# Patient Record
Sex: Female | Born: 1950 | Race: White | Hispanic: No | Marital: Married | Smoking: Former smoker
Health system: Southern US, Community
[De-identification: ages and names within clinical notes are randomized; demographics above are authoritative.]

## PROBLEM LIST (undated history)

## (undated) DIAGNOSIS — C801 Malignant (primary) neoplasm, unspecified: Secondary | ICD-10-CM

## (undated) DIAGNOSIS — I10 Essential (primary) hypertension: Secondary | ICD-10-CM

---

## 1995-07-11 DIAGNOSIS — C4359 Malignant melanoma of other part of trunk: Secondary | ICD-10-CM

## 1995-07-11 HISTORY — DX: Malignant melanoma of other part of trunk: C43.59

## 2010-08-05 ENCOUNTER — Emergency Department (HOSPITAL_COMMUNITY)
Admission: EM | Admit: 2010-08-05 | Discharge: 2010-08-06 | Payer: Self-pay | Source: Home / Self Care | Admitting: Emergency Medicine

## 2010-08-06 LAB — CBC
HCT: 45.7 % (ref 36.0–46.0)
Hemoglobin: 15.7 g/dL — ABNORMAL HIGH (ref 12.0–15.0)
MCH: 31.3 pg (ref 26.0–34.0)
MCHC: 34.4 g/dL (ref 30.0–36.0)
MCV: 91.2 fL (ref 78.0–100.0)
Platelets: 209 10*3/uL (ref 150–400)
RBC: 5.01 MIL/uL (ref 3.87–5.11)
RDW: 12.7 % (ref 11.5–15.5)
WBC: 10.9 10*3/uL — ABNORMAL HIGH (ref 4.0–10.5)

## 2010-08-06 LAB — COMPREHENSIVE METABOLIC PANEL
ALT: 20 U/L (ref 0–35)
AST: 34 U/L (ref 0–37)
Albumin: 4.5 g/dL (ref 3.5–5.2)
Alkaline Phosphatase: 100 U/L (ref 39–117)
BUN: 8 mg/dL (ref 6–23)
CO2: 26 mEq/L (ref 19–32)
Calcium: 9.6 mg/dL (ref 8.4–10.5)
Chloride: 101 mEq/L (ref 96–112)
Creatinine, Ser: 1.01 mg/dL (ref 0.4–1.2)
GFR calc Af Amer: >60 mL/min (ref >60)
GFR calc non Af Amer: 56 mL/min — ABNORMAL LOW (ref >60)
Glucose, Bld: 160 mg/dL — ABNORMAL HIGH (ref 70–99)
Potassium: 3.7 mEq/L (ref 3.5–5.1)
Sodium: 140 mEq/L (ref 135–145)
Total Bilirubin: 1.2 mg/dL (ref 0.3–1.2)
Total Protein: 7.7 g/dL (ref 6.0–8.3)

## 2010-08-06 LAB — POCT CARDIAC MARKERS
CKMB, poc: 1.3 ng/mL (ref 1.0–8.0)
Myoglobin, poc: 118 ng/mL (ref 12–200)
Troponin i, poc: <0.05 ng/mL (ref 0.00–0.09)

## 2010-08-06 LAB — URINALYSIS, ROUTINE W REFLEX MICROSCOPIC
Bilirubin Urine: NEGATIVE
Ketones, ur: NEGATIVE mg/dL
Nitrite: NEGATIVE
Protein, ur: 100 mg/dL — AB
Specific Gravity, Urine: 1.009 (ref 1.005–1.030)
Urine Glucose, Fasting: NEGATIVE mg/dL
Urobilinogen, UA: 0.2 mg/dL (ref 0.0–1.0)
pH: 7 (ref 5.0–8.0)

## 2010-08-06 LAB — URINE MICROSCOPIC-ADD ON

## 2010-08-06 LAB — DIFFERENTIAL
Basophils Absolute: 0.1 10*3/uL (ref 0.0–0.1)
Basophils Relative: 1 % (ref 0–1)
Eosinophils Absolute: 0.1 10*3/uL (ref 0.0–0.7)
Eosinophils Relative: 1 % (ref 0–5)
Lymphocytes Relative: 25 % (ref 12–46)
Lymphs Abs: 2.7 10*3/uL (ref 0.7–4.0)
Monocytes Absolute: 0.8 10*3/uL (ref 0.1–1.0)
Monocytes Relative: 7 % (ref 3–12)
Neutro Abs: 7.2 10*3/uL (ref 1.7–7.7)
Neutrophils Relative %: 66 % (ref 43–77)

## 2010-08-06 LAB — ETHANOL: Alcohol, Ethyl (B): <5 mg/dL (ref 0–10)

## 2010-08-06 LAB — RAPID URINE DRUG SCREEN, HOSP PERFORMED
Amphetamines: NOT DETECTED
Barbiturates: NOT DETECTED
Benzodiazepines: NOT DETECTED
Cocaine: NOT DETECTED
Opiates: NOT DETECTED
Tetrahydrocannabinol: NOT DETECTED

## 2010-08-07 ENCOUNTER — Inpatient Hospital Stay (HOSPITAL_COMMUNITY)
Admission: EM | Admit: 2010-08-07 | Discharge: 2010-08-12 | DRG: 304 | Disposition: A | Discharge status: Home / Self Care | Payer: Self-pay | Attending: Internal Medicine | Admitting: Internal Medicine

## 2010-08-07 ENCOUNTER — Emergency Department (HOSPITAL_COMMUNITY)
Admission: EM | Admit: 2010-08-07 | Discharge: 2010-08-07 | Payer: Self-pay | Source: Home / Self Care | Admitting: Family Medicine

## 2010-08-07 DIAGNOSIS — Z91199 Patient's noncompliance with other medical treatment and regimen due to unspecified reason: Secondary | ICD-10-CM

## 2010-08-07 DIAGNOSIS — I6350 Cerebral infarction due to unspecified occlusion or stenosis of unspecified cerebral artery: Secondary | ICD-10-CM | POA: Diagnosis present

## 2010-08-07 DIAGNOSIS — F411 Generalized anxiety disorder: Secondary | ICD-10-CM | POA: Diagnosis present

## 2010-08-07 DIAGNOSIS — I10 Essential (primary) hypertension: Principal | ICD-10-CM | POA: Diagnosis present

## 2010-08-07 DIAGNOSIS — R569 Unspecified convulsions: Secondary | ICD-10-CM | POA: Diagnosis present

## 2010-08-07 DIAGNOSIS — Z9119 Patient's noncompliance with other medical treatment and regimen: Secondary | ICD-10-CM

## 2010-08-07 DIAGNOSIS — H269 Unspecified cataract: Secondary | ICD-10-CM | POA: Diagnosis present

## 2010-08-07 DIAGNOSIS — Z7982 Long term (current) use of aspirin: Secondary | ICD-10-CM

## 2010-08-07 LAB — CBC
HCT: 46.4 % — ABNORMAL HIGH (ref 36.0–46.0)
Hemoglobin: 15.5 g/dL — ABNORMAL HIGH (ref 12.0–15.0)
MCH: 30.9 pg (ref 26.0–34.0)
MCHC: 33.4 g/dL (ref 30.0–36.0)
MCV: 92.6 fL (ref 78.0–100.0)
Platelets: 200 10*3/uL (ref 150–400)
RBC: 5.01 MIL/uL (ref 3.87–5.11)
RDW: 12.6 % (ref 11.5–15.5)
WBC: 12.5 10*3/uL — ABNORMAL HIGH (ref 4.0–10.5)

## 2010-08-07 LAB — COMPREHENSIVE METABOLIC PANEL
ALT: 22 U/L (ref 0–35)
AST: 42 U/L — ABNORMAL HIGH (ref 0–37)
Albumin: 4.4 g/dL (ref 3.5–5.2)
Alkaline Phosphatase: 88 U/L (ref 39–117)
BUN: 7 mg/dL (ref 6–23)
CO2: 28 mEq/L (ref 19–32)
Calcium: 9.5 mg/dL (ref 8.4–10.5)
Chloride: 102 mEq/L (ref 96–112)
Creatinine, Ser: 0.83 mg/dL (ref 0.4–1.2)
GFR calc Af Amer: >60 mL/min (ref >60)
GFR calc non Af Amer: >60 mL/min (ref >60)
Glucose, Bld: 116 mg/dL — ABNORMAL HIGH (ref 70–99)
Potassium: 4.3 mEq/L (ref 3.5–5.1)
Sodium: 140 mEq/L (ref 135–145)
Total Bilirubin: 2 mg/dL — ABNORMAL HIGH (ref 0.3–1.2)
Total Protein: 7.7 g/dL (ref 6.0–8.3)

## 2010-08-07 LAB — URINALYSIS, ROUTINE W REFLEX MICROSCOPIC
Bilirubin Urine: NEGATIVE
Hgb urine dipstick: NEGATIVE
Ketones, ur: NEGATIVE mg/dL
Nitrite: NEGATIVE
Protein, ur: NEGATIVE mg/dL
Specific Gravity, Urine: 1.006 (ref 1.005–1.030)
Urine Glucose, Fasting: NEGATIVE mg/dL
Urobilinogen, UA: 0.2 mg/dL (ref 0.0–1.0)
pH: 7.5 (ref 5.0–8.0)

## 2010-08-07 LAB — POCT CARDIAC MARKERS
CKMB, poc: 1.3 ng/mL (ref 1.0–8.0)
CKMB, poc: 2.4 ng/mL (ref 1.0–8.0)
Myoglobin, poc: 132 ng/mL (ref 12–200)
Myoglobin, poc: 148 ng/mL (ref 12–200)
Troponin i, poc: <0.05 ng/mL (ref 0.00–0.09)
Troponin i, poc: <0.05 ng/mL (ref 0.00–0.09)

## 2010-08-07 LAB — TROPONIN I: Troponin I: 0.01 ng/mL (ref 0.00–0.06)

## 2010-08-07 LAB — CK TOTAL AND CKMB (NOT AT ARMC)
CK, MB: 2.8 ng/mL (ref 0.3–4.0)
Relative Index: 0.9 (ref 0.0–2.5)
Total CK: 300 U/L — ABNORMAL HIGH (ref 7–177)

## 2010-08-07 LAB — DIFFERENTIAL
Basophils Absolute: 0.1 10*3/uL (ref 0.0–0.1)
Basophils Relative: 1 % (ref 0–1)
Eosinophils Absolute: 0.2 10*3/uL (ref 0.0–0.7)
Eosinophils Relative: 2 % (ref 0–5)
Lymphocytes Relative: 23 % (ref 12–46)
Lymphs Abs: 2.9 10*3/uL (ref 0.7–4.0)
Monocytes Absolute: 0.9 10*3/uL (ref 0.1–1.0)
Monocytes Relative: 7 % (ref 3–12)
Neutro Abs: 8.4 10*3/uL — ABNORMAL HIGH (ref 1.7–7.7)
Neutrophils Relative %: 67 % (ref 43–77)

## 2010-08-07 LAB — RAPID STREP SCREEN (MED CTR MEBANE ONLY): Streptococcus, Group A Screen (Direct): NEGATIVE

## 2010-08-08 LAB — CARDIAC PANEL(CRET KIN+CKTOT+MB+TROPI)
CK, MB: 4.2 ng/mL — ABNORMAL HIGH (ref 0.3–4.0)
CK, MB: 4.4 ng/mL — ABNORMAL HIGH (ref 0.3–4.0)
Relative Index: 1.2 (ref 0.0–2.5)
Relative Index: 1 (ref 0.0–2.5)
Total CK: 362 U/L — ABNORMAL HIGH (ref 7–177)
Total CK: 421 U/L — ABNORMAL HIGH (ref 7–177)
Troponin I: 0.11 ng/mL — ABNORMAL HIGH (ref 0.00–0.06)
Troponin I: 0.16 ng/mL — ABNORMAL HIGH (ref 0.00–0.06)

## 2010-08-08 LAB — LIPID PANEL
Cholesterol: 149 mg/dL (ref 0–200)
HDL: 34 mg/dL — ABNORMAL LOW (ref >39)
LDL Cholesterol: 92 mg/dL (ref 0–99)
Total CHOL/HDL Ratio: 4.4 RATIO
Triglycerides: 115 mg/dL (ref <150)
VLDL: 23 mg/dL (ref 0–40)

## 2010-08-08 LAB — GLUCOSE, CAPILLARY
Glucose-Capillary: 98 mg/dL (ref 70–99)
Glucose-Capillary: 113 mg/dL — ABNORMAL HIGH (ref 70–99)
Glucose-Capillary: 147 mg/dL — ABNORMAL HIGH (ref 70–99)

## 2010-08-08 LAB — MRSA PCR SCREENING: MRSA by PCR: NEGATIVE

## 2010-08-08 LAB — TSH: TSH: 2.105 u[IU]/mL (ref 0.350–4.500)

## 2010-08-09 LAB — CBC
HCT: 40.9 % (ref 36.0–46.0)
Hemoglobin: 13.5 g/dL (ref 12.0–15.0)
MCH: 30.7 pg (ref 26.0–34.0)
MCHC: 33 g/dL (ref 30.0–36.0)
MCV: 93 fL (ref 78.0–100.0)
Platelets: 161 10*3/uL (ref 150–400)
RBC: 4.4 MIL/uL (ref 3.87–5.11)
RDW: 13.1 % (ref 11.5–15.5)
WBC: 10.1 10*3/uL (ref 4.0–10.5)

## 2010-08-09 LAB — RENAL FUNCTION PANEL
Albumin: 3.6 g/dL (ref 3.5–5.2)
BUN: 12 mg/dL (ref 6–23)
CO2: 28 mEq/L (ref 19–32)
Calcium: 8.8 mg/dL (ref 8.4–10.5)
Chloride: 102 mEq/L (ref 96–112)
Creatinine, Ser: 0.91 mg/dL (ref 0.4–1.2)
GFR calc Af Amer: >60 mL/min (ref >60)
GFR calc non Af Amer: >60 mL/min (ref >60)
Glucose, Bld: 101 mg/dL — ABNORMAL HIGH (ref 70–99)
Phosphorus: 3.3 mg/dL (ref 2.3–4.6)
Potassium: 3.5 mEq/L (ref 3.5–5.1)
Sodium: 139 mEq/L (ref 135–145)

## 2010-08-09 LAB — DIFFERENTIAL
Basophils Absolute: 0.1 10*3/uL (ref 0.0–0.1)
Basophils Relative: 1 % (ref 0–1)
Eosinophils Absolute: 0.3 10*3/uL (ref 0.0–0.7)
Eosinophils Relative: 3 % (ref 0–5)
Lymphocytes Relative: 32 % (ref 12–46)
Lymphs Abs: 3.2 10*3/uL (ref 0.7–4.0)
Monocytes Absolute: 0.9 10*3/uL (ref 0.1–1.0)
Monocytes Relative: 9 % (ref 3–12)
Neutro Abs: 5.6 10*3/uL (ref 1.7–7.7)
Neutrophils Relative %: 56 % (ref 43–77)

## 2010-08-11 LAB — CBC
HCT: 41.5 % (ref 36.0–46.0)
Hemoglobin: 13.7 g/dL (ref 12.0–15.0)
MCH: 30.7 pg (ref 26.0–34.0)
MCHC: 33 g/dL (ref 30.0–36.0)
MCV: 93 fL (ref 78.0–100.0)
Platelets: 199 10*3/uL (ref 150–400)
RBC: 4.46 MIL/uL (ref 3.87–5.11)
RDW: 13.1 % (ref 11.5–15.5)
WBC: 10.7 10*3/uL — ABNORMAL HIGH (ref 4.0–10.5)

## 2010-08-11 LAB — BASIC METABOLIC PANEL
BUN: 10 mg/dL (ref 6–23)
CO2: 26 mEq/L (ref 19–32)
Calcium: 9 mg/dL (ref 8.4–10.5)
Chloride: 99 mEq/L (ref 96–112)
Creatinine, Ser: 0.8 mg/dL (ref 0.4–1.2)
GFR calc Af Amer: >60 mL/min (ref >60)
GFR calc non Af Amer: >60 mL/min (ref >60)
Glucose, Bld: 113 mg/dL — ABNORMAL HIGH (ref 70–99)
Potassium: 3.8 mEq/L (ref 3.5–5.1)
Sodium: 135 mEq/L (ref 135–145)

## 2010-08-12 LAB — CBC
HCT: 38.7 % (ref 36.0–46.0)
Hemoglobin: 12.9 g/dL (ref 12.0–15.0)
MCH: 30.2 pg (ref 26.0–34.0)
MCHC: 33.3 g/dL (ref 30.0–36.0)
MCV: 90.6 fL (ref 78.0–100.0)
Platelets: 200 10*3/uL (ref 150–400)
RBC: 4.27 MIL/uL (ref 3.87–5.11)
RDW: 12.8 % (ref 11.5–15.5)
WBC: 10.2 10*3/uL (ref 4.0–10.5)

## 2010-08-12 LAB — BASIC METABOLIC PANEL
BUN: 9 mg/dL (ref 6–23)
CO2: 27 mEq/L (ref 19–32)
Calcium: 9.3 mg/dL (ref 8.4–10.5)
Chloride: 93 mEq/L — ABNORMAL LOW (ref 96–112)
Creatinine, Ser: 0.94 mg/dL (ref 0.4–1.2)
GFR calc Af Amer: >60 mL/min (ref >60)
GFR calc non Af Amer: >60 mL/min (ref >60)
Glucose, Bld: 105 mg/dL — ABNORMAL HIGH (ref 70–99)
Potassium: 3.8 mEq/L (ref 3.5–5.1)
Sodium: 132 mEq/L — ABNORMAL LOW (ref 135–145)

## 2010-08-12 LAB — MRSA PCR SCREENING: MRSA by PCR: NEGATIVE

## 2010-08-15 ENCOUNTER — Emergency Department (HOSPITAL_COMMUNITY): Payer: Self-pay

## 2010-08-15 ENCOUNTER — Emergency Department (HOSPITAL_COMMUNITY)
Admission: EM | Admit: 2010-08-15 | Discharge: 2010-08-15 | Disposition: A | Discharge status: Home / Self Care | Payer: Self-pay | Attending: Emergency Medicine | Admitting: Emergency Medicine

## 2010-08-15 DIAGNOSIS — K8020 Calculus of gallbladder without cholecystitis without obstruction: Secondary | ICD-10-CM | POA: Insufficient documentation

## 2010-08-15 DIAGNOSIS — I10 Essential (primary) hypertension: Secondary | ICD-10-CM | POA: Insufficient documentation

## 2010-08-15 DIAGNOSIS — R109 Unspecified abdominal pain: Secondary | ICD-10-CM | POA: Insufficient documentation

## 2010-08-15 DIAGNOSIS — J029 Acute pharyngitis, unspecified: Secondary | ICD-10-CM | POA: Insufficient documentation

## 2010-08-15 LAB — COMPREHENSIVE METABOLIC PANEL
ALT: 37 U/L — ABNORMAL HIGH (ref 0–35)
AST: 42 U/L — ABNORMAL HIGH (ref 0–37)
Albumin: 4.1 g/dL (ref 3.5–5.2)
Alkaline Phosphatase: 81 U/L (ref 39–117)
BUN: 11 mg/dL (ref 6–23)
CO2: 27 mEq/L (ref 19–32)
Calcium: 9.5 mg/dL (ref 8.4–10.5)
Chloride: 90 mEq/L — ABNORMAL LOW (ref 96–112)
Creatinine, Ser: 0.9 mg/dL (ref 0.4–1.2)
GFR calc Af Amer: >60 mL/min (ref >60)
GFR calc non Af Amer: >60 mL/min (ref >60)
Glucose, Bld: 110 mg/dL — ABNORMAL HIGH (ref 70–99)
Potassium: 4.2 mEq/L (ref 3.5–5.1)
Sodium: 129 mEq/L — ABNORMAL LOW (ref 135–145)
Total Bilirubin: 1.8 mg/dL — ABNORMAL HIGH (ref 0.3–1.2)
Total Protein: 7.3 g/dL (ref 6.0–8.3)

## 2010-08-15 LAB — DIFFERENTIAL
Basophils Absolute: 0.1 10*3/uL (ref 0.0–0.1)
Basophils Relative: 1 % (ref 0–1)
Eosinophils Absolute: 0.2 10*3/uL (ref 0.0–0.7)
Eosinophils Relative: 1 % (ref 0–5)
Lymphocytes Relative: 18 % (ref 12–46)
Lymphs Abs: 2.6 10*3/uL (ref 0.7–4.0)
Monocytes Absolute: 1.1 10*3/uL — ABNORMAL HIGH (ref 0.1–1.0)
Monocytes Relative: 8 % (ref 3–12)
Neutro Abs: 10.4 10*3/uL — ABNORMAL HIGH (ref 1.7–7.7)
Neutrophils Relative %: 72 % (ref 43–77)

## 2010-08-15 LAB — CBC
HCT: 41.3 % (ref 36.0–46.0)
Hemoglobin: 14.5 g/dL (ref 12.0–15.0)
MCH: 31.7 pg (ref 26.0–34.0)
MCHC: 35.1 g/dL (ref 30.0–36.0)
MCV: 90.4 fL (ref 78.0–100.0)
Platelets: 261 10*3/uL (ref 150–400)
RBC: 4.57 MIL/uL (ref 3.87–5.11)
RDW: 12.5 % (ref 11.5–15.5)
WBC: 14.4 10*3/uL — ABNORMAL HIGH (ref 4.0–10.5)

## 2010-08-15 LAB — URINALYSIS, ROUTINE W REFLEX MICROSCOPIC
Bilirubin Urine: NEGATIVE
Hgb urine dipstick: NEGATIVE
Ketones, ur: NEGATIVE mg/dL
Nitrite: NEGATIVE
Protein, ur: NEGATIVE mg/dL
Specific Gravity, Urine: 1.01 (ref 1.005–1.030)
Urine Glucose, Fasting: NEGATIVE mg/dL
Urobilinogen, UA: 0.2 mg/dL (ref 0.0–1.0)
pH: 7 (ref 5.0–8.0)

## 2010-08-15 LAB — URINE MICROSCOPIC-ADD ON

## 2010-08-15 LAB — LIPASE, BLOOD: Lipase: 104 U/L — ABNORMAL HIGH (ref 11–59)

## 2010-08-15 LAB — RAPID STREP SCREEN (MED CTR MEBANE ONLY): Streptococcus, Group A Screen (Direct): NEGATIVE

## 2010-08-15 MED ORDER — IOHEXOL 300 MG/ML  SOLN: 100.0000 mL | Freq: Once | INTRAMUSCULAR | Status: DC | PRN

## 2010-08-15 NOTE — Consult Note (Signed)
[name], IMPARATO NO.:  [id number]  MEDICAL RECORD NO.:  [id number]          PATIENT TYPE:  EMS  LOCATION:  MAJO                         FACILITY:  [facility]  PHYSICIAN:  Homero Fellers, MD   DATE OF BIRTH:  [birth year]  DATE OF CONSULTATION:  08/07/2010 DATE OF DISCHARGE:                                CONSULTATION   PRIMARY CARE PHYSICIAN:  None.  CHIEF COMPLAINT:  The patient presented because of dry mouth.  HISTORY:  This is a 60 year old woman with longstanding history of high blood pressure who has not been on medications since 2007.  She came to the emergency room 2 days ago and was found to have elevated blood pressure.  She was given atenolol and was discharged home.  The patient said she has been compliant with the medication but today came back with elevated blood pressure as high as 209/128 in the emergency room.  She was given 20 mg of IV labetalol with improvement of her blood pressure to 149/121.  The patient denies any chest pain, shortness of breath, nausea, vomiting, or diaphoresis.  She has no headache or weakness in any of the extremities.  The patient also has significant history of anxiety and sometimes complained of poor sleep.  There was a mention about bumping into staff or object by a family member.  He wondered there was no dizziness, vertigo, or falls.  The patient has bad vision from her cataracts and has not seen a doctor in several years.  She says she has little or no vision in her left eye.  PAST MEDICAL HISTORY: 1. High blood pressure. 2. Cataracts. 3. Anxiety. 4. Poor vision. 5. Panic attack.  MEDICATIONS:  ATENOLOL.  ALLERGIES:  None.  SOCIAL HISTORY:  No smoking, alcohol, or drug use.  FAMILY HISTORY:  Noncontributory.  REVIEW OF SYSTEMS:  Ten-point review of systems is essentially as above.  PHYSICAL EXAMINATION:  VITAL SIGNS:  Blood pressure currently 149/121, pulse 84, respirations 18, and temperature  97.4. HEENT:  Pallor. NECK:  Supple.  No JVD. LUNGS:  Clear bilaterally to auscultation. HEART:  S1-S2.  No murmurs. ABDOMEN:  Full, soft, and nontender. EXTREMITIES:  No edema. NEUROLOGIC:  There is no deficit apart from poor vision in the left eye. The patient is not dizzy upon standing.  She is able to ambulate with no difficulty.  LABORATORY DATA:  Cardiac enzymes x1 negative.  White count 12.5, hemoglobin 15.5, and platelet count 200.  Sodium 140, potassium 4.3, BUN 7, and creatinine 0.8.  Liver enzymes normal.  Chest x-ray showed no active lung disease.  Urinalysis showed no evidence of urinary tract infection.  Head CT showed small lacunar type infarct in the right basal ganglia which I described as old.  ASSESSMENT: 1. This is a 60 year old woman with hypertensive urgency which is     beginning to improve with medication.  The patient has no evidence     to suggest any acute neurological issues going on at this time.     She needs to be further optimized with her blood pressure medicine. 2. Poor vision likely secondary  to cataract which mostly explained     bumping into objects.  She needs to see an ophthalmologist in the     next few weeks. 3. Anxiety.  I will get some anti-anxiety medication such as Valium or     Ativan and get also some Ambien for sleep.  DISPOSITION:  Home.  The patient has been given contact to follow with the new primary care physician.  We will make an effort to get a new provider next week.  CONDITION ON DISCHARGE:  Stable.     Homero Fellers, MD     FA/MEDQ  D:  08/07/2010  T:  08/07/2010  Job:  [id number]  Electronically Signed by Homero Fellers  on 08/15/2010 10:30:55 PM

## 2010-08-15 NOTE — H&P (Signed)
  NAME:  [name], BOLTE NO.:  [id number]  MEDICAL RECORD NO.:  [id number]          PATIENT TYPE:  EMS  LOCATION:  MAJO                         FACILITY:  [facility]  PHYSICIAN:  Homero Fellers, MD   DATE OF BIRTH:  1951-06-12  DATE OF ADMISSION:  08/07/2010 DATE OF DISCHARGE:                             HISTORY & PHYSICAL   ADDENDUM:  This is a 60 year old woman who was initially seen in the emergency room as a consultation and was planned to be discharged home for hypertensive urgency.  She got some medication in the emergency room with improvement in her blood pressure.  Her last blood pressure is 125/74 after getting several medicines including IV labetalol 20 mg, IV hydralazine 20 mg, lisinopril/hydrochlorothiazide 20/12.5, and labetalol 20 mg p.o.  The patient also got 0.5 mg IV Ativan for agitation and anxiety.  Even though her blood pressure is controlled now, she is quite drowsy and probably unsafe to discharge home.  She lives with her husband, but her husband is currently not available to take her home. The patient will be admitted for observation.  She will get vital signs every 4 hours.  I will continue oral labetalol 20 mg p.o. b.i.d. and lisinopril/hydrochlorothiazide 20/12.5 mg one tablet daily.  We can use IV hydralazine for very high blood pressure.  The patient also got a fasting lipid profile and serial cardiac enzymes while admission.  Her admission diagnosis remained the same including: 1. Hypertensive urgency. 2. Anxiety. 3. Poor medication compliance.  PLAN:  As described above.  CONDITION:  Stable.  She can be discharged home in the morning if remained stable.     Homero Fellers, MD     FA/MEDQ  D:  08/07/2010  T:  08/07/2010  Job:  [id number]  Electronically Signed by Homero Fellers  on 08/15/2010 10:31:00 PM

## 2010-08-16 LAB — URINE CULTURE
Colony Count: >=100000
Culture  Setup Time: 201202061200

## 2010-08-18 ENCOUNTER — Emergency Department (HOSPITAL_COMMUNITY)
Admission: EM | Admit: 2010-08-18 | Discharge: 2010-08-18 | Disposition: A | Discharge status: Home / Self Care | Payer: Self-pay | Attending: Emergency Medicine | Admitting: Emergency Medicine

## 2010-08-18 DIAGNOSIS — I10 Essential (primary) hypertension: Secondary | ICD-10-CM | POA: Insufficient documentation

## 2010-08-18 DIAGNOSIS — Z7982 Long term (current) use of aspirin: Secondary | ICD-10-CM | POA: Insufficient documentation

## 2010-08-18 DIAGNOSIS — Z79899 Other long term (current) drug therapy: Secondary | ICD-10-CM | POA: Insufficient documentation

## 2010-08-18 DIAGNOSIS — Z9889 Other specified postprocedural states: Secondary | ICD-10-CM | POA: Insufficient documentation

## 2010-08-18 DIAGNOSIS — J3489 Other specified disorders of nose and nasal sinuses: Secondary | ICD-10-CM | POA: Insufficient documentation

## 2010-08-19 NOTE — Consult Note (Signed)
[name], [name] NO.:  [id number]  MEDICAL RECORD NO.:  [id number]          PATIENT TYPE:  OBV  LOCATION:  3306                         FACILITY:  [facility]  PHYSICIAN:  Levert Feinstein, MD          DATE OF BIRTH:  24-Nov-1950  DATE OF CONSULTATION:  08/08/2010 DATE OF DISCHARGE:                                CONSULTATION   REASON FOR CONSULTATION:  New-onset seizure.  HISTORY OF PRESENT ILLNESS:  This is a pleasant 60 year old female with past medical history of hypertension.  The patient was initially seen in the emergency department on August 06, 2010, at that time she was seen for what is noted to be a panic attack.  The patient was seen by physician assistant Jaynie Crumble, given IV fluid bolus and labetalol.  This patient was discharged home with primary diagnosis of hypertension and advised to see a primary care physician.  The patient was prescribed atenolol 50 mg tabs 30 and was taking 2 tablets daily at that time.  The patient returns to the emergency department on August 07, 2010, at this time for elevated blood pressure and mental status changes.  Per family members, she had been taking her blood pressure medications.  However, her daughter noted that the patient did not seem to be acting herself.  On that visit the patient again was given antihypertensive medications and discharged home.  The patient returned a third time to the [facility] Emergency Department on August 08, 2010. Again, the patient complained of high blood pressure, however at this time there were additional symptoms such as stumbling around and running into walls, falling multiple times.  The patient's sister who is with the patient at that time explained that she was again unable to converse very fluently.  On this visit, the patient's blood pressure was read to be 209/128 and the patient was administered multiple doses of labetalol 20 mg IV push and lisinopril 20 mg p.o.  orally.  In addition, the patient was also given Ativan and hydralazine.  The patient was admitted to [facility] for further evaluation.  After being admitted, the patient was noted to have a seizure event which she described as a full body tonic/posturing events that lasted for about 5 minutes.  The patient at that time was given 2 mg IV of Ativan.  Neurology was asked to consult.  At the present time the patient is alert and oriented, moving all extremities, showing no seizure-like activity.  The patient states that she is not sore.  She denies any recollection of the seizure activity.  Per nurse who is at her bedside, apparently she was postictal status post activity, in addition she did have urinary incontinence.  PAST MEDICAL HISTORY:  Hypertension, cataracts, anxiety, decreased vision secondary to cataracts and panic attacks.  HOME MEDICATIONS:  Atenolol, senna, aspirin, Tylenol, Advil.  ALLERGIES:  No known drug allergies.  SOCIAL HISTORY:  She lives with her daughter and her husband.  She does not smoke, drink or do illicit drugs.  She is retired Musician.  REVIEW OF SYSTEMS:  Positive  for throat discomfort, hypertension, anxiety.  On physical exam; blood pressure is 157/75, pulse 109, respirations 20, temperature 99.2.  The patient is alert and oriented x3.  Carries out 2 and 3 steps commands without difficulty.  Pupils are equal, round and reactive to light and accommodating, slightly sluggish.  She has conjugate gaze.  Extraocular movements are intact.  Visual fields are grossly intact.  Double simultaneous stimuli.  The patient does complain of having decreased vision out of her right eye, however, it is secondary to cataracts.  Face symmetrical.  Tongue is midline.  Uvula is midline.  The patient shows no dysarthria, aphasia or slurred speech. Facial sensation is within normal limits bilaterally.  Shoulder shrug, head turn is within normal  limits.  Coordination, finger-to-nose, heel-to-shin were smooth.  Motor; the patient's motor was 5/5 throughout with no clonus, asterixis or tremors.  Deep tendon reflexes 2+ throughout downgoing toes bilaterally.  She did have 1+ Achilles reflexes.  Drift.  The patient had negative drift in the upper and lower extremity.  Sensation is intact to pinprick, light touch and vibration throughout.  Pulmonary, clear to auscultation bilaterally.  Cardiovascular, S1 and S2 is regular, rate and rhythm.  Neck, negative for bruits and supple.  Labs; CKs 362, CK-MB 4.2, troponin 0.11.  TSH is 2.15.  UA negative. Sodium 140, potassium 4.3, chloride 102, CO2 28, BUN 7, creatinine 0.83, glucose 116, white blood cell count is 12.5, hemoglobin is 15.5, hematocrit 46.4, platelets 200, triglycerides 115, cholesterol 149, HDL 34, LDL is 92.  Imaging; CT of head shows periventricular white matter disease with lacunar infarcts in the right basal ganglia.  CT of head and neck was negative for any fracture or significant encroachment of spinal cord.  ASSESSMENT:  This is a 60 year old female who was admitted initially for hypertensive urgency which was then followed by a new-onset seizure activity that lasted approximately 5 minutes.  The patient does have remote right basal ganglia infarcts which could serve as a seizure focus.  At this time due to this being her first seizure and no further seizures, we will hold off on antiepileptic drugs.  EEG at this time is in progress.  We will continue to follow the patient while she is in the hospital.  If EEG does show diagnostic abnormalities at that time, we will initiate antiepileptic medications.     Felicie Morn, PA-C   ______________________________ Levert Feinstein, MD    DS/MEDQ  D:  08/08/2010  T:  08/09/2010  Job:  [id number]  cc:   Levert Feinstein, MD  Electronically Signed by Felicie Morn PA-C on 08/11/2010 09:12:21 AM Electronically Signed by Levert Feinstein MD on 08/19/2010 10:34:14 AM

## 2010-08-24 NOTE — Discharge Summary (Signed)
NAMEJADAMARIE, [name] NO.:  [id number]  MEDICAL RECORD NO.:  [id number]           PATIENT TYPE:  I  LOCATION:  2916                         FACILITY:  [facility]  PHYSICIAN:  Peggye Pitt, M.D. DATE OF BIRTH:  1950/12/23  DATE OF ADMISSION:  08/07/2010 DATE OF DISCHARGE:  08/12/2010                              DISCHARGE SUMMARY   PRIMARY CARE PHYSICIAN:  She has been newly set up to see Dr. Andrey Campanile at [facility] on September 30, 2010 at 2:15 p.m.  She, however, has her [facility] eligibility appointment on September 05, 2010 at 3 p.m.  DISCHARGE DIAGNOSES: 1. Hypertensive urgency, resolved. 2. History of hypertension, noncompliant with medications. 3. Anxiety. 4. Cataracts. 5. New cerebrovascular accident. 6. 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  DISCHARGE MEDICATIONS: 1. Aspirin 81 mg daily. 2. Hydrochlorothiazide 25 mg daily. 3. Lisinopril 20 mg daily. 4. Lorazepam 0.25 mg three times a day. 5. Metoprolol 50 mg twice daily. 6. Ambien 5 mg at bedtime as needed for insomnia. 7. Tylenol 500 mg one tablet every 6 hours as needed for pain or     fever. 8. Artificial tears one to two drops in each eye daily. 9. Senna one tablet daily as needed for constipation.  DISPOSITION AND FOLLOWUP:  [name] will be discharged home today in stable and improved condition.  We have secured a followup appointment at [facility], please see above for details.  She has also been set up for outpatient physical therapy and speech therapy.  IMAGES AND PROCEDURES THIS HOSPITALIZATION: 1. Chest x-ray on August 06, 2010, that showed a questionable of     acute bronchitis. 2. CT of head on August 07, 2010, that showed three small occluded-     type infarcts in the right basal ganglia likely remote with     periventricular white matter disease. 3. Repeat chest x-ray on August 07, 2010, that showed stable     borderline  cardiomegaly. 4. CT scan of the head on August 08, 2010, that showed no interval     change with age-indeterminate lacunar infarcts, no hemorrhage or     new infarcts identified. 5. MRI of the brain on August 09, 2010, that showed a subacute     infarction evolving the right corona radiata and lentiform nucleus.     She has age advanced atrophy and moderate white matter disease.  An     MRI that shows mild small vessel disease and no significant     aneurysm, proximal stenosis, or branch vessel occlusion.  CONSULTATION THIS HOSPITALIZATION:  Dr. Terrace Arabia with Neurology.  HISTORY AND PHYSICAL:  For full details, please see dictation by Dr. Phineas Douglas on August 07, 2010.  In brief, [name] is a 60 year old Caucasian lady with a history of anxiety and hypertension who has not been taking medications since 2007 secondary to financial difficulties. She came in to the emergency department 2 days prior, was given blood pressure medication and sent home and returned today because of continued headache and dry mouth.  Here, she was found again to have high blood  pressure; however, her blood pressure quickly normalized with some medication given by the EDP and we initially consulted the patient and we were planning on sending her home.  However, the patient was then found to be somewhat drowsy and we are asked to fully admit the patient.  HOSPITAL COURSE BY PROBLEM: 1. Hypertensive urgency.  Her blood pressure did decrease with IV     medication.  She was started on three oral medications including     metoprolol, lisinopril, and hydrochlorothiazide.  The patient has a     pretty significant anxiety disorder and I believe that her anxiety     is also driving her blood pressure up.  On hospital day #4, her     blood pressure increased significantly to the point where we had to     move her back to the step-down unit and start her on an IV drip.     This has now been discontinued for about 14 hours  and her blood     pressure is wonderfu,  now that we have started her on some     scheduled Ativan.  I will proceed with discharging her home today. 2. Seizure disorder.  She did have a seizure episode while in the     hospital.  This prompted a neurological evaluation, ordered an MRI.     Because this was her first seizure and probably related to     hypertension, they decided not to treat with antiseizure     medications at this time.  She has not had any further seizure     episodes. 3. New onset cerebrovascular accident.  This was noted on MRI scan.     This was probably related to high blood pressure.  She has been     started on aspirin.  PT, OT and speech have evaluated her.  We have     set her up for outpatient physical and speech therapy, mostly for     language, as she does have some slurred speech.  Stroke workup     including echoes and carotid Dopplers have been normal.  VITAL SIGNS ON DAY OF DISCHARGE:  Blood pressure 147/84, heart rate 84, respirations 20, sats of 100% on room air, and temperature of 97.6.     Peggye Pitt, M.D.     EH/MEDQ  D:  08/12/2010  T:  08/13/2010  Job:  [id number]  cc:   Levert Feinstein, MD  Electronically Signed by Peggye Pitt M.D. on 08/24/2010 08:11:45 AM

## 2010-09-04 ENCOUNTER — Emergency Department (HOSPITAL_COMMUNITY): Payer: Self-pay

## 2010-09-04 ENCOUNTER — Emergency Department (HOSPITAL_COMMUNITY)
Admission: EM | Admit: 2010-09-04 | Discharge: 2010-09-04 | Disposition: A | Discharge status: Home / Self Care | Payer: Self-pay | Attending: Emergency Medicine | Admitting: Emergency Medicine

## 2010-09-04 DIAGNOSIS — E871 Hypo-osmolality and hyponatremia: Secondary | ICD-10-CM | POA: Insufficient documentation

## 2010-09-04 DIAGNOSIS — R143 Flatulence: Secondary | ICD-10-CM | POA: Insufficient documentation

## 2010-09-04 DIAGNOSIS — R142 Eructation: Secondary | ICD-10-CM | POA: Insufficient documentation

## 2010-09-04 DIAGNOSIS — R141 Gas pain: Secondary | ICD-10-CM | POA: Insufficient documentation

## 2010-09-04 DIAGNOSIS — Z8679 Personal history of other diseases of the circulatory system: Secondary | ICD-10-CM | POA: Insufficient documentation

## 2010-09-04 DIAGNOSIS — R109 Unspecified abdominal pain: Secondary | ICD-10-CM | POA: Insufficient documentation

## 2010-09-04 DIAGNOSIS — M549 Dorsalgia, unspecified: Secondary | ICD-10-CM | POA: Insufficient documentation

## 2010-09-04 DIAGNOSIS — I10 Essential (primary) hypertension: Secondary | ICD-10-CM | POA: Insufficient documentation

## 2010-09-04 DIAGNOSIS — R079 Chest pain, unspecified: Secondary | ICD-10-CM | POA: Insufficient documentation

## 2010-09-04 LAB — COMPREHENSIVE METABOLIC PANEL
ALT: 21 U/L (ref 0–35)
AST: 26 U/L (ref 0–37)
Albumin: 3.9 g/dL (ref 3.5–5.2)
Alkaline Phosphatase: 79 U/L (ref 39–117)
BUN: 9 mg/dL (ref 6–23)
CO2: 26 mEq/L (ref 19–32)
Calcium: 8.9 mg/dL (ref 8.4–10.5)
Chloride: 92 mEq/L — ABNORMAL LOW (ref 96–112)
Creatinine, Ser: 0.92 mg/dL (ref 0.4–1.2)
GFR calc Af Amer: >60 mL/min (ref >60)
GFR calc non Af Amer: >60 mL/min (ref >60)
Glucose, Bld: 157 mg/dL — ABNORMAL HIGH (ref 70–99)
Potassium: 3.8 mEq/L (ref 3.5–5.1)
Sodium: 127 mEq/L — ABNORMAL LOW (ref 135–145)
Total Bilirubin: 1.3 mg/dL — ABNORMAL HIGH (ref 0.3–1.2)
Total Protein: 6.7 g/dL (ref 6.0–8.3)

## 2010-09-04 LAB — DIFFERENTIAL
Basophils Absolute: 0 10*3/uL (ref 0.0–0.1)
Basophils Relative: 1 % (ref 0–1)
Eosinophils Absolute: 0.2 10*3/uL (ref 0.0–0.7)
Eosinophils Relative: 2 % (ref 0–5)
Lymphocytes Relative: 24 % (ref 12–46)
Lymphs Abs: 1.8 10*3/uL (ref 0.7–4.0)
Monocytes Absolute: 0.6 10*3/uL (ref 0.1–1.0)
Monocytes Relative: 8 % (ref 3–12)
Neutro Abs: 4.7 10*3/uL (ref 1.7–7.7)
Neutrophils Relative %: 65 % (ref 43–77)

## 2010-09-04 LAB — CBC
HCT: 38.1 % (ref 36.0–46.0)
Hemoglobin: 13.6 g/dL (ref 12.0–15.0)
MCH: 31.9 pg (ref 26.0–34.0)
MCHC: 35.7 g/dL (ref 30.0–36.0)
MCV: 89.2 fL (ref 78.0–100.0)
Platelets: 158 10*3/uL (ref 150–400)
RBC: 4.27 MIL/uL (ref 3.87–5.11)
RDW: 12.5 % (ref 11.5–15.5)
WBC: 7.2 10*3/uL (ref 4.0–10.5)

## 2010-09-04 LAB — POCT CARDIAC MARKERS
CKMB, poc: 1.6 ng/mL (ref 1.0–8.0)
CKMB, poc: 1.1 ng/mL (ref 1.0–8.0)
Myoglobin, poc: 66.1 ng/mL (ref 12–200)
Myoglobin, poc: 60.5 ng/mL (ref 12–200)
Troponin i, poc: <0.05 ng/mL (ref 0.00–0.09)
Troponin i, poc: <0.05 ng/mL (ref 0.00–0.09)

## 2010-09-04 LAB — URINALYSIS, ROUTINE W REFLEX MICROSCOPIC
Bilirubin Urine: NEGATIVE
Hgb urine dipstick: NEGATIVE
Ketones, ur: NEGATIVE mg/dL
Nitrite: NEGATIVE
Protein, ur: NEGATIVE mg/dL
Specific Gravity, Urine: 1.01 (ref 1.005–1.030)
Urine Glucose, Fasting: NEGATIVE mg/dL
Urobilinogen, UA: 0.2 mg/dL (ref 0.0–1.0)
pH: 6.5 (ref 5.0–8.0)

## 2010-09-04 LAB — LIPASE, BLOOD: Lipase: 68 U/L — ABNORMAL HIGH (ref 11–59)

## 2011-07-18 ENCOUNTER — Emergency Department (HOSPITAL_COMMUNITY)
Admission: EM | Admit: 2011-07-18 | Discharge: 2011-07-18 | Disposition: A | Discharge status: Home / Self Care | Payer: Self-pay | Attending: Emergency Medicine | Admitting: Emergency Medicine

## 2011-07-18 ENCOUNTER — Encounter: Payer: Self-pay | Admitting: *Deleted

## 2011-07-18 DIAGNOSIS — I10 Essential (primary) hypertension: Secondary | ICD-10-CM | POA: Insufficient documentation

## 2011-07-18 DIAGNOSIS — Z859 Personal history of malignant neoplasm, unspecified: Secondary | ICD-10-CM | POA: Insufficient documentation

## 2011-07-18 DIAGNOSIS — Z79899 Other long term (current) drug therapy: Secondary | ICD-10-CM | POA: Insufficient documentation

## 2011-07-18 DIAGNOSIS — Z7982 Long term (current) use of aspirin: Secondary | ICD-10-CM | POA: Insufficient documentation

## 2011-07-18 HISTORY — DX: Malignant (primary) neoplasm, unspecified: C80.1

## 2011-07-18 HISTORY — DX: Essential (primary) hypertension: I10

## 2011-07-18 NOTE — ED Notes (Signed)
Pt to ED for eval of increased blood pressure. Pt states she was at her PCPs office and blood pressure was noted to be 224. Pt states she takes her blood pressure medication daily. Pt denies any complaints. Denies any chest pain or neurological symptoms.

## 2011-07-18 NOTE — ED Notes (Addendum)
Pt sent by pcp for eval of hypertension. Pt went to doctor this am for check-up. Pt denies complaints including headache. Pt is on bp medicine.

## 2011-07-18 NOTE — ED Provider Notes (Signed)
Medical screening examination/treatment/procedure(s) were performed by non-physician practitioner and as supervising physician I was immediately available for consultation/collaboration.   Dione Booze, MD 07/18/11 2044

## 2011-07-18 NOTE — ED Notes (Signed)
Patient states she has a doctors visit for next week for re-evaluation of her blood pressure.

## 2011-07-18 NOTE — ED Provider Notes (Signed)
History     CSN: [id number]  Arrival date & time 07/18/11  1243   First MD Initiated Contact with Patient 07/18/11 1326      Chief Complaint  Patient presents with  . Illegal value: [    sent by doctor    (Consider location/radiation/quality/duration/timing/severity/associated sxs/prior treatment) HPI Comments: Patient states she was sent to the ER [facility] for [facility].  States "I told her that my blood pressure is always this high when I first get to the doctor's office and see a new doctor, but if you let me sit down and talk nice to me, it goes down."  States she thinks her blood pressure was as high as 240 systolic at [facility], but has already decreased to 188/110 here.  States she has been taking her blood pressure medication as directed, but has not been checking her for blood pressure at home as regularly as she used to.  States she checks her blood pressure about once a week and they have been much lower than when she is  around medical personnel. Denies fever, headache, chest pain, shortness of breath, abdominal pain, nausea, vomiting, focal, neurological deficits, any confusion.  The history is provided by the patient.    Past Medical History  Diagnosis Date  . Hypertension   . Cancer     No past surgical history on file.  No family history on file.  History  Substance Use Topics  . Smoking status: Not on file  . Smokeless tobacco: Not on file  . Alcohol Use:     OB History    Grav Para Term Preterm Abortions TAB SAB Ect Mult Living                  Review of Systems  All other systems reviewed and are negative.    Allergies  Review of patient's allergies indicates no known allergies.  Home Medications   Current Outpatient Rx  Name Route Sig Dispense Refill  . ALPRAZOLAM 0.25 MG PO TABS Oral Take 0.25 mg by mouth 3 (three) times daily as needed. For anxiety     . ASPIRIN EC 81 MG PO TBEC Oral Take 81 mg by mouth  daily.      Marland Kitchen CITALOPRAM HYDROBROMIDE 20 MG PO TABS Oral Take 20 mg by mouth daily.      Marland Kitchen DEXTROMETHORPHAN POLISTIREX ER 30 MG/5ML PO LQCR Oral Take 60 mg by mouth as needed. For cough     . DICYCLOMINE HCL 20 MG PO TABS Oral Take 20 mg by mouth 2 (two) times daily as needed.      Marland Kitchen HYPROMELLOSE 2.5 % OP SOLN Both Eyes Place 1 drop into both eyes 3 (three) times daily as needed. For dry eyes     . LISINOPRIL 20 MG PO TABS Oral Take 20 mg by mouth daily.      Marland Kitchen LORATADINE 10 MG PO TABS Oral Take 10 mg by mouth daily.      Marland Kitchen METOPROLOL TARTRATE 50 MG PO TABS Oral Take 50 mg by mouth 2 (two) times daily.        BP 188/110  Pulse 86  Temp 98.2 F (36.8 C)  Resp 22  SpO2 100%  Physical Exam  Nursing note and vitals reviewed. Constitutional: She is oriented to person, place, and time. She appears well-developed and well-nourished.  HENT:  Head: Normocephalic and atraumatic.  Neck: Neck supple.  Cardiovascular: Normal rate, regular  rhythm and normal heart sounds.   Pulmonary/Chest: Breath sounds normal. No respiratory distress. She has no wheezes. She has no rales. She exhibits no tenderness.  Abdominal: Soft. Bowel sounds are normal. There is no tenderness.  Musculoskeletal: Normal range of motion. She exhibits no edema and no tenderness.  Neurological: She is alert and oriented to person, place, and time. She has normal strength. No cranial nerve deficit or sensory deficit. She exhibits normal muscle tone. Coordination normal. GCS eye subscore is 4. GCS verbal subscore is 5. GCS motor subscore is 6.       CN II-XII intact, EOMs intact, grip strengths normal, equal bilaterally, strength 5/5 in all extremities,     ED Course  Procedures (including critical care time)  Labs Reviewed - No data to display No results found.  Discussed patient with Dr Preston Fleeting.  Patient with known hypertension on multiple medications at home.  Patient also with anxiety and known white-coat induced  hypertension.  Patient is completely asymptomatic - denies CP, SOB, HA.  No AMS.  No neuro deficits.  Plan, as discussed with Dr Preston Fleeting, is discharge home without treatment of HTN and continued home treatment as well as home monitoring.  Discussed this with patient, pt agrees with plan.   1. Hypertension       MDM  Patient with known hypertension - completely asymptomatic in ED, sent by her PCP who was a new provider to her today at [facility].  Patient d/c home with instructions for monitoring BP and discussion about reasons for immediate return.  Pt verbalizes understanding.           Rise Patience, [place] 07/18/11 1525

## 2012-02-01 IMAGING — CR DG CHEST 2V
2 series · 2 of 2 positions shown · non-contrast
Comparison: 08/06/2010

CLINICAL DATA: Uncontrolled hypertension.

CHEST - 2 VIEW

[w chest pa]
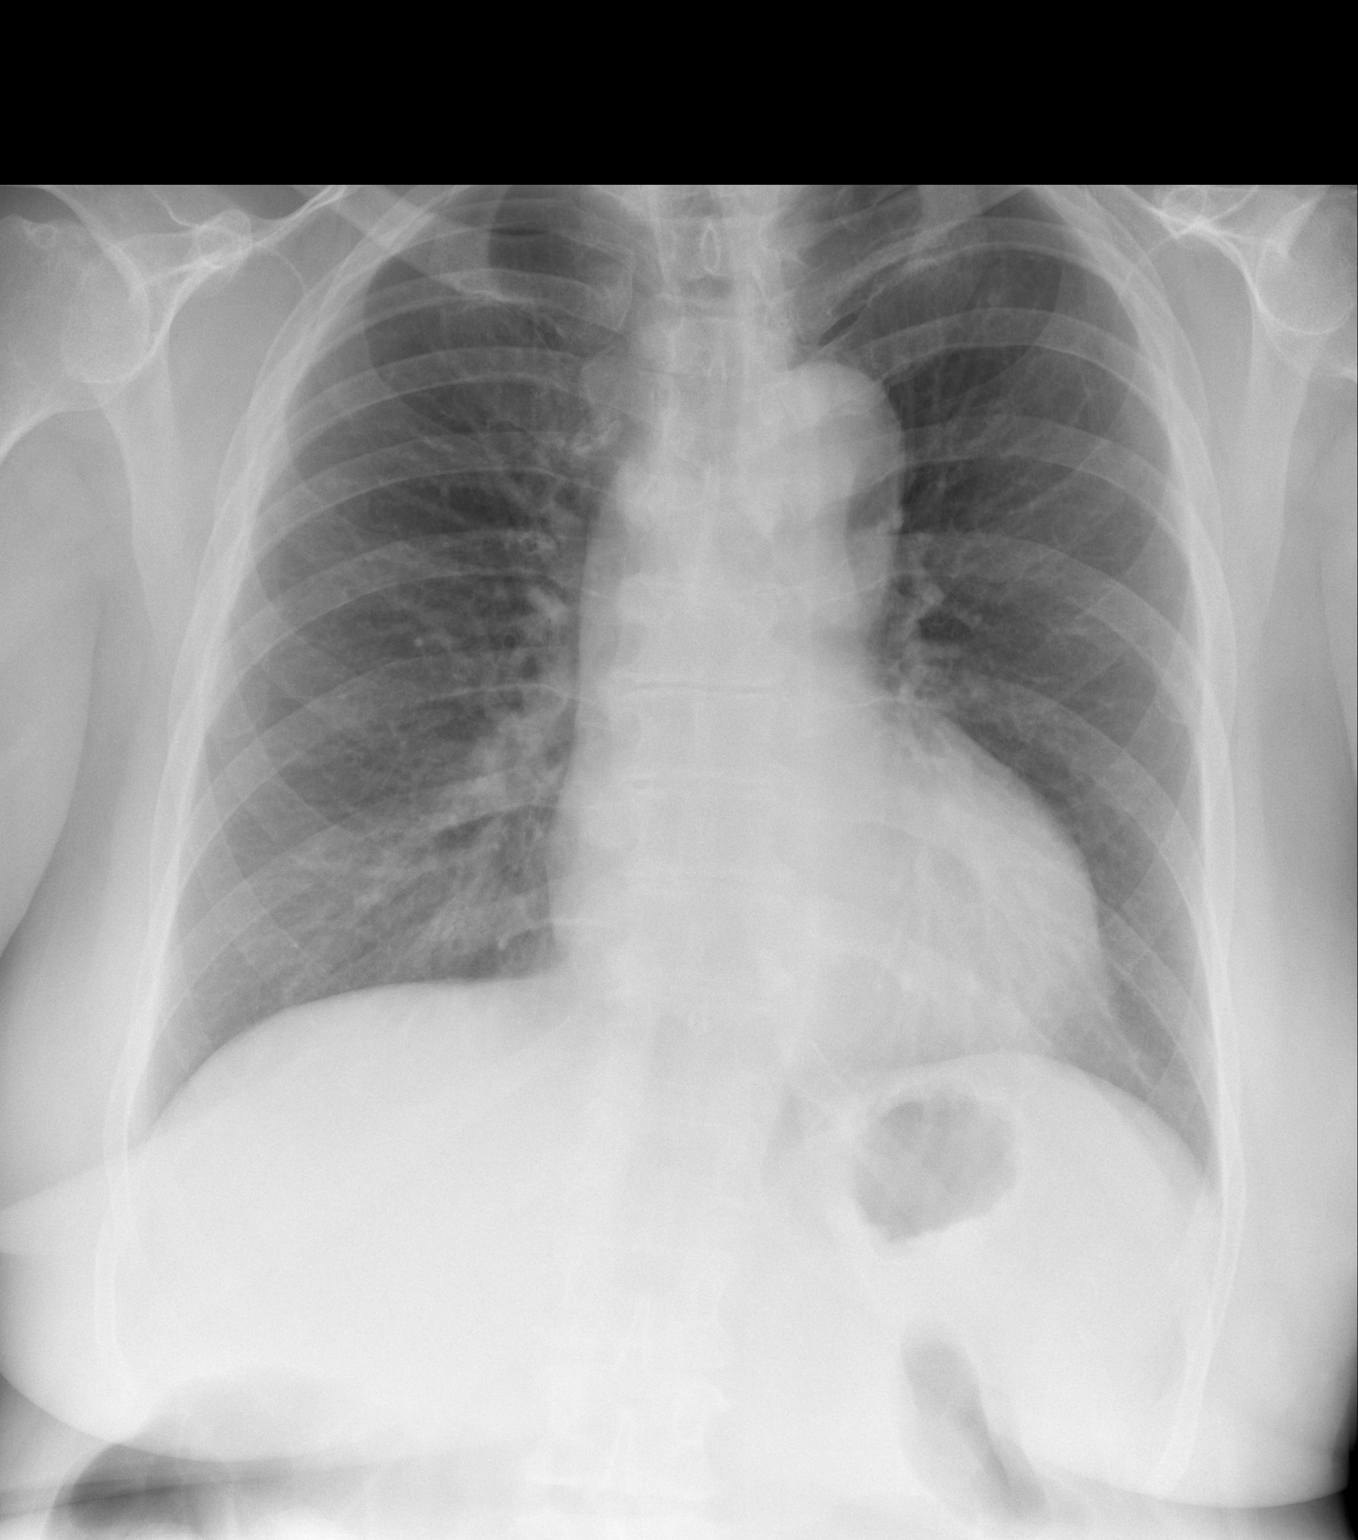

[w chest lat *]
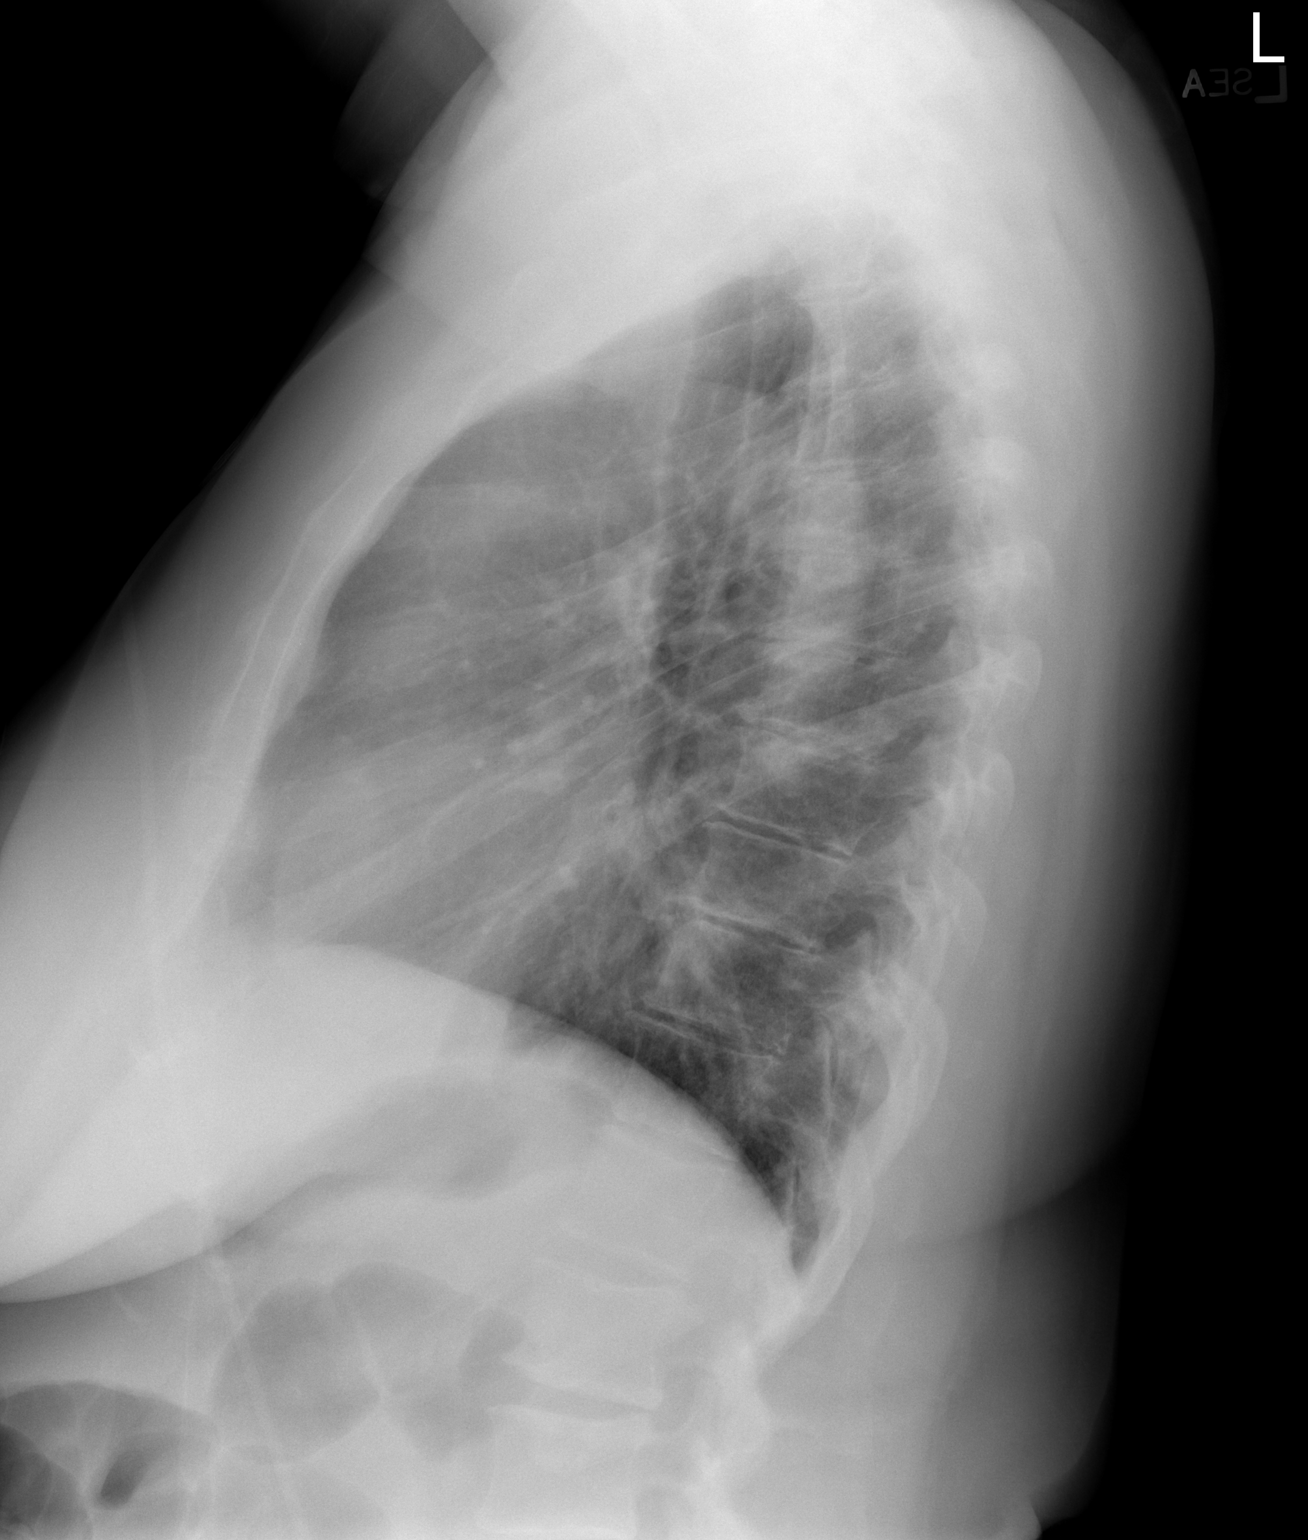

[2 of 2 positions shown; findings below may reference images not displayed]

FINDINGS: Heart size is at the upper limit of normal in stable.
Mild ectasia of the thoracic aorta is unchanged.  Both lungs are
clear.  No mass or adenopathy identified.
IMPRESSION: Stable borderline cardiomegaly.  No active lung disease.

## 2012-02-01 IMAGING — CT CT HEAD W/O CM
1 series · 16 of 28 positions shown, 20 images · non-contrast
Comparison: None

CLINICAL DATA: Dizziness and unsteady gait.

CT HEAD WITHOUT CONTRAST
TECHNIQUE: Contiguous axial images were obtained from the base of
the skull through the vertex without contrast.

[Series 2: brain · axial · 0.47mm/px · z∈[+146,+277]mm · 16 of 28 slices shown, 20 images]
[im 2/28  brain]
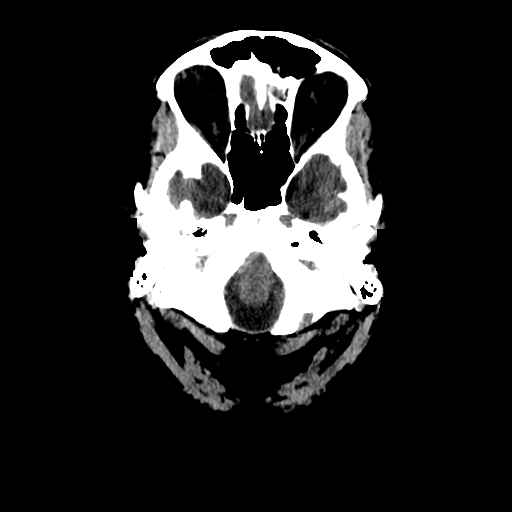
[im 2/28  bone]
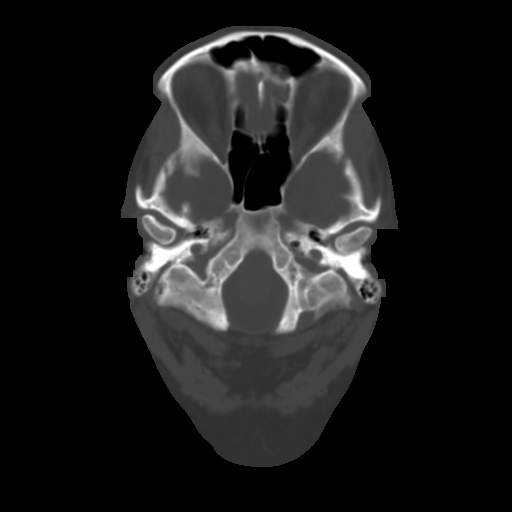
[im 4/28  brain]
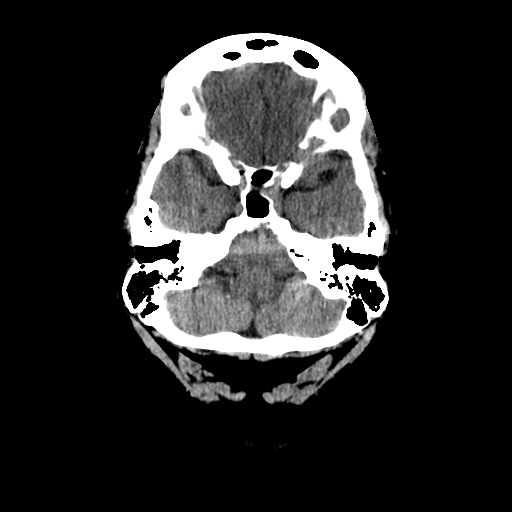
[im 6/28  brain]
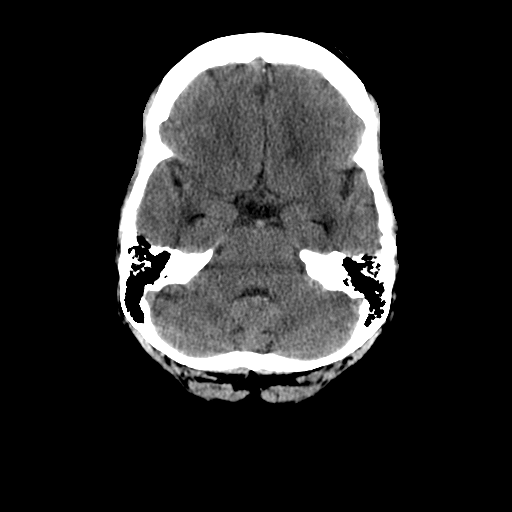
[im 7/28  brain]
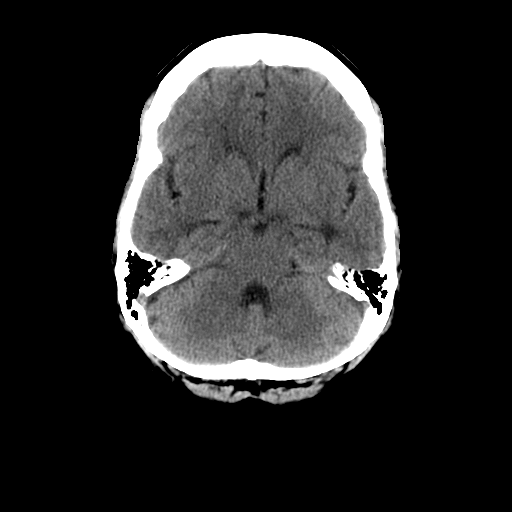
[im 9/28  brain]
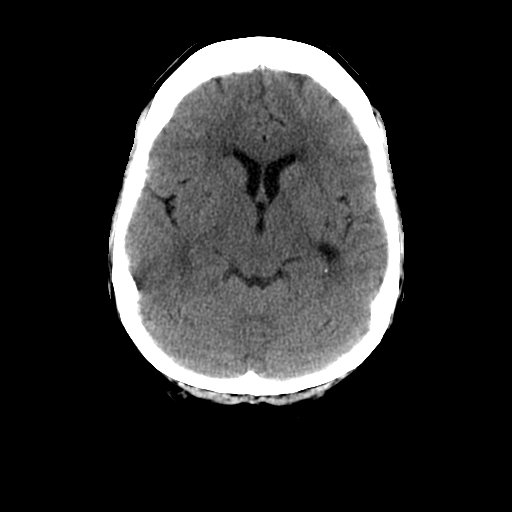
[im 9/28  bone]
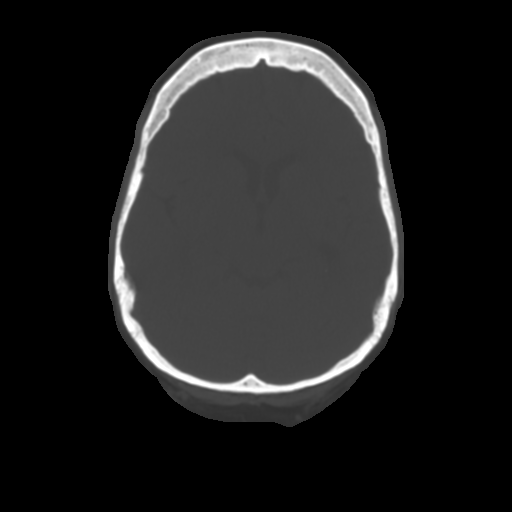
[im 10/28  brain]
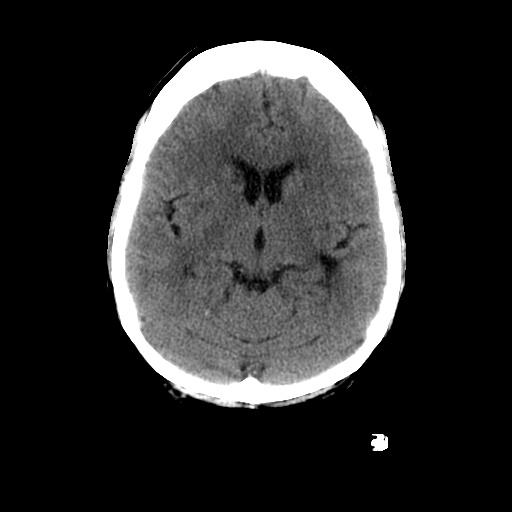
[im 12/28  brain]
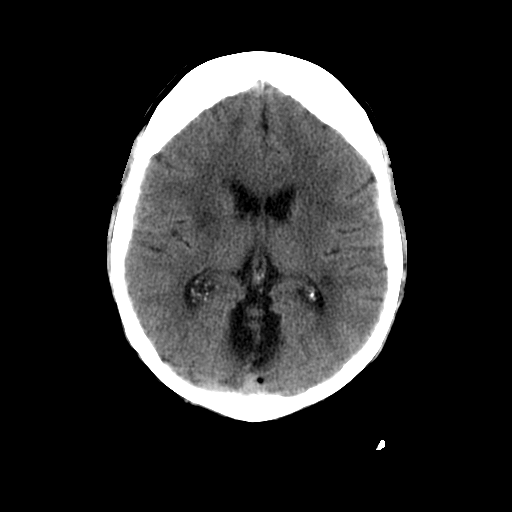
[im 14/28  brain]
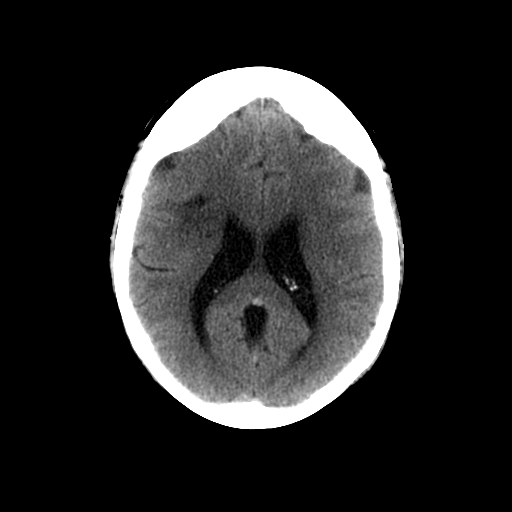
[im 15/28  brain]
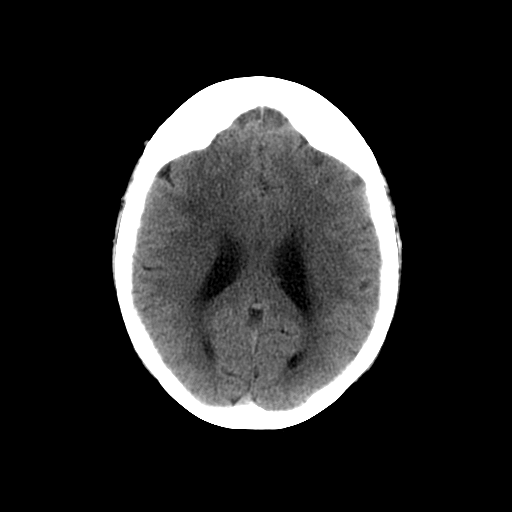
[im 15/28  bone]
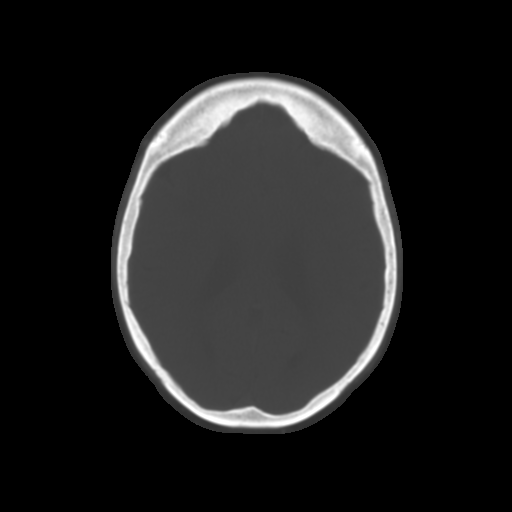
[im 17/28  brain]
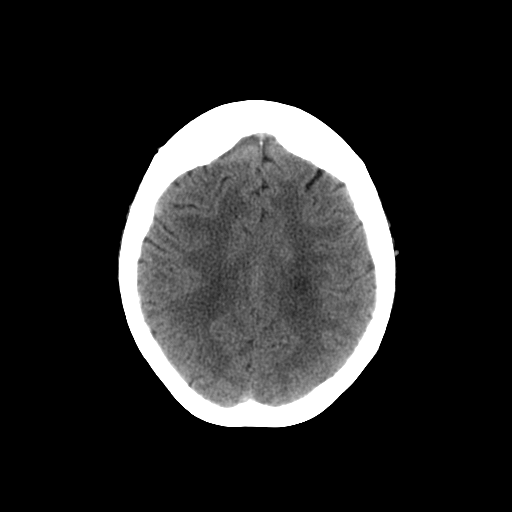
[im 19/28  brain]
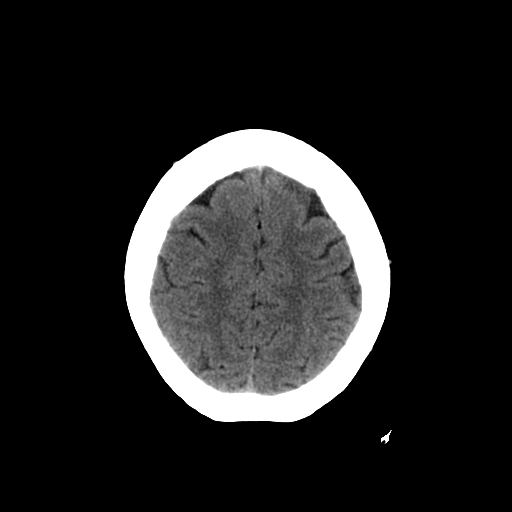
[im 20/28  brain]
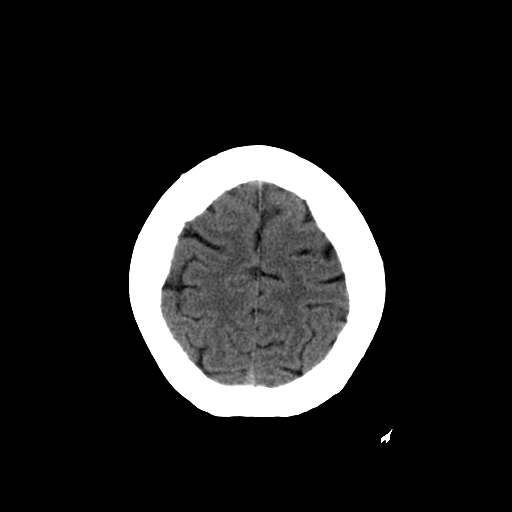
[im 22/28  brain]
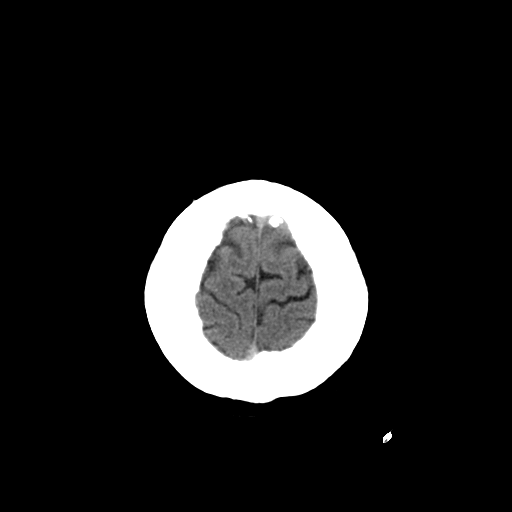
[im 22/28  bone]
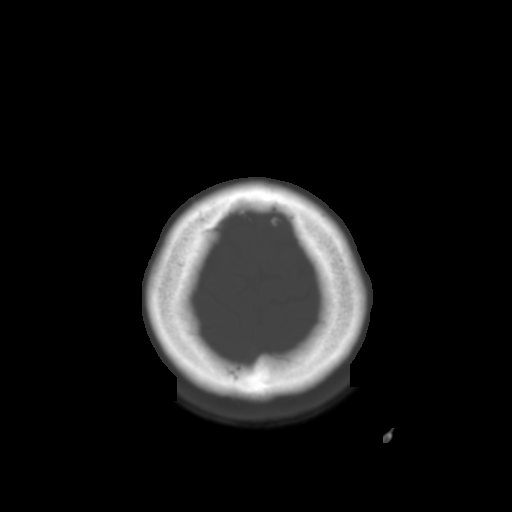
[im 23/28  brain]
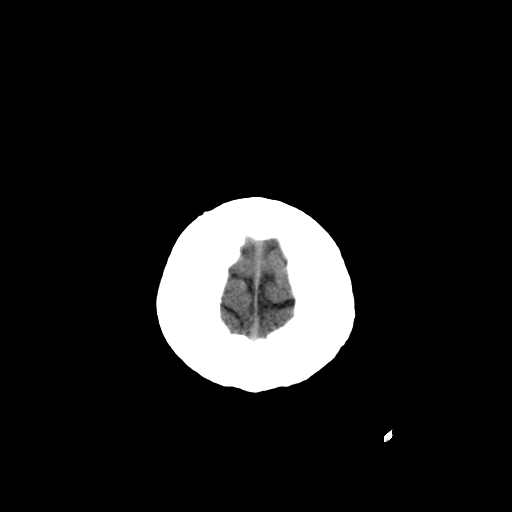
[im 25/28  brain]
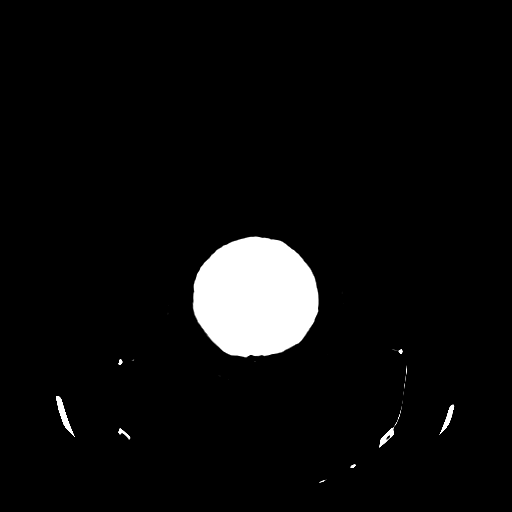
[im 27/28  brain]
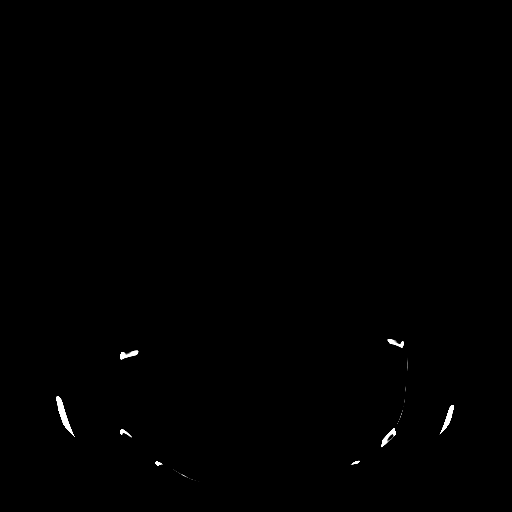

[16 of 28 positions shown; findings below may reference images not displayed]

FINDINGS: The ventricles are normal.  No extra-axial fluid
collections are seen.  There are three small areas of low
attenuation in the right basal ganglia area which are likely
lacunar type infarcts.  These are most likely remote.  Changes of
periventricular white matter disease are noted.  No  CT findings
for hemispheric infarction or intracranial hemorrhage.  No mass
lesions.  The brainstem and cerebellum grossly normal.

The bony calvarium is intact.  Hyperostosis frontalis interna is
noted.  The paranasal sinuses and mastoid air cells are clear.
IMPRESSION: 1.  Three small lacunar type infarcts in the right basal ganglia
region.  These are likely remote.
2.  Periventricular white matter disease.
3.  No findings for hemispheric infarction or intracranial
hemorrhage.

## 2013-07-10 HISTORY — PX: CATARACT EXTRACTION: SUR2

## 2015-08-20 DIAGNOSIS — F411 Generalized anxiety disorder: Secondary | ICD-10-CM | POA: Diagnosis not present

## 2015-08-20 DIAGNOSIS — E785 Hyperlipidemia, unspecified: Secondary | ICD-10-CM | POA: Diagnosis not present

## 2015-08-20 DIAGNOSIS — Z23 Encounter for immunization: Secondary | ICD-10-CM | POA: Diagnosis not present

## 2015-08-20 DIAGNOSIS — I10 Essential (primary) hypertension: Secondary | ICD-10-CM | POA: Diagnosis not present

## 2015-08-20 DIAGNOSIS — Z1211 Encounter for screening for malignant neoplasm of colon: Secondary | ICD-10-CM | POA: Diagnosis not present

## 2015-08-20 DIAGNOSIS — E119 Type 2 diabetes mellitus without complications: Secondary | ICD-10-CM | POA: Diagnosis not present

## 2016-02-09 DIAGNOSIS — E785 Hyperlipidemia, unspecified: Secondary | ICD-10-CM | POA: Diagnosis not present

## 2016-02-09 DIAGNOSIS — R7309 Other abnormal glucose: Secondary | ICD-10-CM | POA: Diagnosis not present

## 2016-02-09 DIAGNOSIS — Z23 Encounter for immunization: Secondary | ICD-10-CM | POA: Diagnosis not present

## 2016-02-09 DIAGNOSIS — L309 Dermatitis, unspecified: Secondary | ICD-10-CM | POA: Diagnosis not present

## 2016-02-09 DIAGNOSIS — E119 Type 2 diabetes mellitus without complications: Secondary | ICD-10-CM | POA: Diagnosis not present

## 2016-02-09 DIAGNOSIS — F411 Generalized anxiety disorder: Secondary | ICD-10-CM | POA: Diagnosis not present

## 2016-02-09 DIAGNOSIS — I10 Essential (primary) hypertension: Secondary | ICD-10-CM | POA: Diagnosis not present

## 2016-02-09 DIAGNOSIS — Z1211 Encounter for screening for malignant neoplasm of colon: Secondary | ICD-10-CM | POA: Diagnosis not present

## 2016-02-09 DIAGNOSIS — Z0000 Encounter for general adult medical examination without abnormal findings: Secondary | ICD-10-CM | POA: Diagnosis not present

## 2016-09-07 DIAGNOSIS — I10 Essential (primary) hypertension: Secondary | ICD-10-CM | POA: Diagnosis not present

## 2016-09-07 DIAGNOSIS — Z1211 Encounter for screening for malignant neoplasm of colon: Secondary | ICD-10-CM | POA: Diagnosis not present

## 2016-09-07 DIAGNOSIS — F411 Generalized anxiety disorder: Secondary | ICD-10-CM | POA: Diagnosis not present

## 2016-09-07 DIAGNOSIS — E119 Type 2 diabetes mellitus without complications: Secondary | ICD-10-CM | POA: Diagnosis not present

## 2016-09-07 DIAGNOSIS — Z23 Encounter for immunization: Secondary | ICD-10-CM | POA: Diagnosis not present

## 2016-09-07 DIAGNOSIS — E785 Hyperlipidemia, unspecified: Secondary | ICD-10-CM | POA: Diagnosis not present

## 2016-10-09 DIAGNOSIS — R748 Abnormal levels of other serum enzymes: Secondary | ICD-10-CM | POA: Diagnosis not present

## 2017-04-04 DIAGNOSIS — E785 Hyperlipidemia, unspecified: Secondary | ICD-10-CM | POA: Diagnosis not present

## 2017-04-04 DIAGNOSIS — Z1211 Encounter for screening for malignant neoplasm of colon: Secondary | ICD-10-CM | POA: Diagnosis not present

## 2017-04-04 DIAGNOSIS — E119 Type 2 diabetes mellitus without complications: Secondary | ICD-10-CM | POA: Diagnosis not present

## 2017-04-04 DIAGNOSIS — R748 Abnormal levels of other serum enzymes: Secondary | ICD-10-CM | POA: Diagnosis not present

## 2017-04-04 DIAGNOSIS — F411 Generalized anxiety disorder: Secondary | ICD-10-CM | POA: Diagnosis not present

## 2017-04-04 DIAGNOSIS — Z0000 Encounter for general adult medical examination without abnormal findings: Secondary | ICD-10-CM | POA: Diagnosis not present

## 2017-04-04 DIAGNOSIS — I10 Essential (primary) hypertension: Secondary | ICD-10-CM | POA: Diagnosis not present

## 2017-04-04 DIAGNOSIS — Z23 Encounter for immunization: Secondary | ICD-10-CM | POA: Diagnosis not present

## 2018-07-31 DIAGNOSIS — Z1211 Encounter for screening for malignant neoplasm of colon: Secondary | ICD-10-CM | POA: Diagnosis not present

## 2018-07-31 DIAGNOSIS — E785 Hyperlipidemia, unspecified: Secondary | ICD-10-CM | POA: Diagnosis not present

## 2018-07-31 DIAGNOSIS — I10 Essential (primary) hypertension: Secondary | ICD-10-CM | POA: Diagnosis not present

## 2018-07-31 DIAGNOSIS — E1169 Type 2 diabetes mellitus with other specified complication: Secondary | ICD-10-CM | POA: Diagnosis not present

## 2018-07-31 DIAGNOSIS — Z23 Encounter for immunization: Secondary | ICD-10-CM | POA: Diagnosis not present

## 2018-07-31 DIAGNOSIS — F411 Generalized anxiety disorder: Secondary | ICD-10-CM | POA: Diagnosis not present

## 2019-05-09 DIAGNOSIS — E1169 Type 2 diabetes mellitus with other specified complication: Secondary | ICD-10-CM | POA: Diagnosis not present

## 2019-05-09 DIAGNOSIS — F411 Generalized anxiety disorder: Secondary | ICD-10-CM | POA: Diagnosis not present

## 2019-05-09 DIAGNOSIS — E785 Hyperlipidemia, unspecified: Secondary | ICD-10-CM | POA: Diagnosis not present

## 2019-05-09 DIAGNOSIS — R143 Flatulence: Secondary | ICD-10-CM | POA: Diagnosis not present

## 2019-05-09 DIAGNOSIS — Z1211 Encounter for screening for malignant neoplasm of colon: Secondary | ICD-10-CM | POA: Diagnosis not present

## 2019-05-09 DIAGNOSIS — I10 Essential (primary) hypertension: Secondary | ICD-10-CM | POA: Diagnosis not present

## 2020-04-27 ENCOUNTER — Emergency Department (HOSPITAL_COMMUNITY)
Admission: EM | Admit: 2020-04-27 | Discharge: 2020-04-28 | Disposition: A | Discharge status: Home / Self Care | Payer: Medicare Other | Attending: Emergency Medicine | Admitting: Emergency Medicine

## 2020-04-27 ENCOUNTER — Encounter (HOSPITAL_COMMUNITY): Payer: Self-pay | Admitting: Emergency Medicine

## 2020-04-27 ENCOUNTER — Other Ambulatory Visit: Payer: Self-pay

## 2020-04-27 DIAGNOSIS — Z79899 Other long term (current) drug therapy: Secondary | ICD-10-CM | POA: Diagnosis not present

## 2020-04-27 DIAGNOSIS — Z7982 Long term (current) use of aspirin: Secondary | ICD-10-CM | POA: Insufficient documentation

## 2020-04-27 DIAGNOSIS — I10 Essential (primary) hypertension: Secondary | ICD-10-CM | POA: Insufficient documentation

## 2020-04-27 DIAGNOSIS — Z87891 Personal history of nicotine dependence: Secondary | ICD-10-CM | POA: Diagnosis not present

## 2020-04-27 DIAGNOSIS — R000 Tachycardia, unspecified: Secondary | ICD-10-CM | POA: Insufficient documentation

## 2020-04-27 LAB — BASIC METABOLIC PANEL
Anion gap: 11 (ref 5–15)
BUN: 19 mg/dL (ref 8–23)
CO2: 25 mmol/L (ref 22–32)
Calcium: 9.3 mg/dL (ref 8.9–10.3)
Chloride: 101 mmol/L (ref 98–111)
Creatinine, Ser: 1.1 mg/dL — ABNORMAL HIGH (ref 0.44–1.00)
GFR, Estimated: 51 mL/min — ABNORMAL LOW (ref >60)
Glucose, Bld: 154 mg/dL — ABNORMAL HIGH (ref 70–99)
Potassium: 3.7 mmol/L (ref 3.5–5.1)
Sodium: 137 mmol/L (ref 135–145)

## 2020-04-27 LAB — CBC
HCT: 50 % — ABNORMAL HIGH (ref 36.0–46.0)
Hemoglobin: 16.4 g/dL — ABNORMAL HIGH (ref 12.0–15.0)
MCH: 31.8 pg (ref 26.0–34.0)
MCHC: 32.8 g/dL (ref 30.0–36.0)
MCV: 97.1 fL (ref 80.0–100.0)
Platelets: 177 10*3/uL (ref 150–400)
RBC: 5.15 MIL/uL — ABNORMAL HIGH (ref 3.87–5.11)
RDW: 11.9 % (ref 11.5–15.5)
WBC: 10.3 10*3/uL (ref 4.0–10.5)
nRBC: 0 % (ref 0.0–0.2)

## 2020-04-27 NOTE — ED Triage Notes (Signed)
Per EMS, pt from home, c/o hypertension.  Pt reports she noticed three days ago and admits that anxiety about the bp has caused her to continue taking her blood pressure.  She has been compliant w/ her medications w/ the exception of her anxiety medication which she stopped taking 2 weeks ago but has since restarted.

## 2020-04-28 LAB — URINALYSIS, ROUTINE W REFLEX MICROSCOPIC
Bacteria, UA: NONE SEEN
Bilirubin Urine: NEGATIVE
Glucose, UA: NEGATIVE mg/dL
Ketones, ur: NEGATIVE mg/dL
Leukocytes,Ua: NEGATIVE
Nitrite: NEGATIVE
Protein, ur: 30 mg/dL — AB
Specific Gravity, Urine: 1.015 (ref 1.005–1.030)
pH: 5 (ref 5.0–8.0)

## 2020-04-28 MED ORDER — HYDROCHLOROTHIAZIDE 25 MG PO TABS: 25.0000 mg | ORAL_TABLET | Freq: Every day | ORAL | Status: DC

## 2020-04-28 MED ORDER — METOPROLOL TARTRATE 25 MG PO TABS
50.0000 mg | ORAL_TABLET | Freq: Once | ORAL | Status: AC
Start: 2020-04-28 — End: 2020-04-28
  Administered 2020-04-28: 50 mg via ORAL
  Filled 2020-04-28: qty 2

## 2020-04-28 MED ORDER — HYDROCHLOROTHIAZIDE 25 MG PO TABS
25.0000 mg | ORAL_TABLET | Freq: Every day | ORAL | Status: DC
  Administered 2020-04-28: 25 mg via ORAL
  Filled 2020-04-28: qty 1

## 2020-04-28 NOTE — ED Provider Notes (Signed)
[facility] EMERGENCY DEPARTMENT Provider Note   CSN: [id number] Arrival date & time: 04/27/20  2040     History Chief Complaint  Patient presents with  . Hypertension    [name] is a 69 y.o. female.  The history is provided by the patient.  Hypertension This is a chronic problem. The problem occurs constantly. The problem has been gradually worsening. Pertinent negatives include no chest pain, no abdominal pain, no headaches and no shortness of breath. Nothing aggravates the symptoms. Nothing relieves the symptoms. Treatments tried: home medications. The treatment provided no relief.  Patient with HTN and anxiety also feels anxious.  She reports she stopped her anxiety medication but thought maybe her anxiety is making her blood pressure worse she she just restarted them.  She denies CP, SOB, weakness numbness, changes in vision or speech.      Past Medical History:  Diagnosis Date  . Cancer ([place])   . Hypertension     There are no problems to display for this patient.   History reviewed. No pertinent surgical history.   OB History   No obstetric history on file.     No family history on file.  Social History   Tobacco Use  . Smoking status: Former Smoker  Substance Use Topics  . Alcohol use: Not on file  . Drug use: Not on file    Home Medications Prior to Admission medications   Medication Sig Start Date End Date Taking? Authorizing Provider  ALPRAZolam (XANAX) 0.25 MG tablet Take 0.25 mg by mouth 3 (three) times daily as needed. For anxiety     [provider]  aspirin EC 81 MG tablet Take 81 mg by mouth daily.      [provider]  citalopram (CELEXA) 20 MG tablet Take 20 mg by mouth daily.      [provider]  dextromethorphan (DELSYM) 30 MG/5ML liquid Take 60 mg by mouth as needed. For cough     [provider]  dicyclomine (BENTYL) 20 MG tablet Take 20 mg by mouth 2 (two) times daily as needed.       [provider]  hydroxypropyl methylcellulose (ISOPTO TEARS) 2.5 % ophthalmic solution Place 1 drop into both eyes 3 (three) times daily as needed. For dry eyes     [provider]  lisinopril (PRINIVIL,ZESTRIL) 20 MG tablet Take 20 mg by mouth daily.      [provider]  loratadine (CLARITIN) 10 MG tablet Take 10 mg by mouth daily.      [provider]  metoprolol (LOPRESSOR) 50 MG tablet Take 50 mg by mouth 2 (two) times daily.      [provider]    Allergies    Patient has no known allergies.  Review of Systems   Review of Systems  Constitutional: Negative for fever.  HENT: Negative for congestion.   Eyes: Negative for visual disturbance.  Respiratory: Negative for shortness of breath.   Cardiovascular: Negative for chest pain.  Gastrointestinal: Negative for abdominal pain.  Genitourinary: Negative for difficulty urinating.  Musculoskeletal: Negative for arthralgias.  Skin: Negative for wound.  Neurological: Negative for facial asymmetry, speech difficulty, weakness, numbness and headaches.  All other systems reviewed and are negative.   Physical Exam Updated Vital Signs BP (!) 195/125 (BP Location: Right Arm)   Pulse (!) 106   Temp 98.4 F (36.9 C) (Oral)   Resp 18   SpO2 94%   Physical Exam Vitals and  nursing note reviewed.  Constitutional:      General: She is not in acute distress.    Appearance: Normal appearance.  HENT:     Head: Normocephalic and atraumatic.     Nose: Nose normal.  Eyes:     Extraocular Movements: Extraocular movements intact.     Conjunctiva/sclera: Conjunctivae normal.     Pupils: Pupils are equal, round, and reactive to light.  Cardiovascular:     Rate and Rhythm: Normal rate and regular rhythm.  Pulmonary:     Effort: Pulmonary effort is normal.     Breath sounds: Normal breath sounds.  Abdominal:     General: Abdomen is flat. Bowel sounds are normal.     Palpations: Abdomen is  soft.     Tenderness: There is no abdominal tenderness. There is no guarding.  Musculoskeletal:        General: Normal range of motion.     Cervical back: Normal range of motion and neck supple.  Skin:    General: Skin is warm and dry.     Capillary Refill: Capillary refill takes less than 2 seconds.  Neurological:     General: No focal deficit present.     Mental Status: She is alert and oriented to person, place, and time.     Cranial Nerves: No cranial nerve deficit.     Deep Tendon Reflexes: Reflexes normal.  Psychiatric:        Mood and Affect: Mood normal.        Behavior: Behavior normal.     ED Results / Procedures / Treatments   Labs (all labs ordered are listed, but only abnormal results are displayed) Labs Reviewed  BASIC METABOLIC PANEL - Abnormal; Notable for the following components:      Result Value   Glucose, Bld 154 (*)    Creatinine, Ser 1.10 (*)    GFR, Estimated 51 (*)    All other components within normal limits  CBC - Abnormal; Notable for the following components:   RBC 5.15 (*)    Hemoglobin 16.4 (*)    HCT 50.0 (*)    All other components within normal limits  URINALYSIS, ROUTINE W REFLEX MICROSCOPIC - Abnormal; Notable for the following components:   Hgb urine dipstick SMALL (*)    Protein, ur 30 (*)    All other components within normal limits    EKG  EKG Interpretation  Date/Time:  Tuesday April 27 2020 20:46:03 EDT Ventricular Rate:  105 PR Interval:  188 QRS Duration: 76 QT Interval:  350 QTC Calculation: 462 R Axis:   -19 Text Interpretation: Sinus tachycardia Confirmed by Randal Buba, Anitria Andon ([id number]) on 04/28/2020 4:19:50 AM       Radiology No results found.  Procedures Procedures (including critical care time)  Medications Ordered in ED Medications  metoprolol tartrate (LOPRESSOR) tablet 50 mg (has no administration in time range)  hydrochlorothiazide (HYDRODIURIL) tablet 25 mg (has no administration in time range)    ED  Course  I have reviewed the triage vital signs and the nursing notes.  Pertinent labs & imaging results that were available during my care of the patient were reviewed by me and considered in my medical decision making (see chart for details).    No symptoms.  BP improved in the ED.  No indication for imaging at this time,  Call your doctor in the am to discuss your HTN medication regimen.  Birda Didonato was evaluated in Emergency Department on 04/28/2020 for the symptoms  described in the history of present illness. She was evaluated in the context of the global COVID-19 pandemic, which necessitated consideration that the patient might be at risk for infection with the SARS-CoV-2 virus that causes COVID-19. Institutional protocols and algorithms that pertain to the evaluation of patients at risk for COVID-19 are in a state of rapid change based on information released by regulatory bodies including the CDC and federal and state organizations. These policies and algorithms were followed during the patient's care in the ED.  Final Clinical Impression(s) / ED Diagnoses Return for intractable cough, coughing up blood,fevers >100.4 unrelieved by medication, shortness of breath, intractable vomiting, chest pain, shortness of breath, weakness,numbness, changes in speech, facial asymmetry,abdominal pain, passing out,Inability to tolerate liquids or food, cough, altered mental status or any concerns. No signs of systemic illness or infection. The patient is nontoxic-appearing on exam and vital signs are within normal limits.   I have reviewed the triage vital signs and the nursing notes. Pertinent labs &imaging results that were available during my care of the patient were reviewed by me and considered in my medical decision making (see chart for details).After history, exam, and medical workup I feel the patient has beenappropriately medically screened and is safe for discharge home. Pertinent  diagnoses were discussed with the patient. Patient was given return precautions.      Jenniffer Vessels, MD 04/28/20 [id number]

## 2021-10-19 DIAGNOSIS — Z1211 Encounter for screening for malignant neoplasm of colon: Secondary | ICD-10-CM | POA: Diagnosis not present

## 2021-10-19 DIAGNOSIS — I10 Essential (primary) hypertension: Secondary | ICD-10-CM | POA: Diagnosis not present

## 2021-10-19 DIAGNOSIS — K589 Irritable bowel syndrome without diarrhea: Secondary | ICD-10-CM | POA: Diagnosis not present

## 2021-10-19 DIAGNOSIS — Z8673 Personal history of transient ischemic attack (TIA), and cerebral infarction without residual deficits: Secondary | ICD-10-CM | POA: Diagnosis not present

## 2021-10-19 DIAGNOSIS — E785 Hyperlipidemia, unspecified: Secondary | ICD-10-CM | POA: Diagnosis not present

## 2021-10-19 DIAGNOSIS — E1169 Type 2 diabetes mellitus with other specified complication: Secondary | ICD-10-CM | POA: Diagnosis not present

## 2021-10-25 DIAGNOSIS — Z1211 Encounter for screening for malignant neoplasm of colon: Secondary | ICD-10-CM | POA: Diagnosis not present

## 2022-01-03 DIAGNOSIS — E785 Hyperlipidemia, unspecified: Secondary | ICD-10-CM | POA: Diagnosis not present

## 2022-01-03 DIAGNOSIS — N289 Disorder of kidney and ureter, unspecified: Secondary | ICD-10-CM | POA: Diagnosis not present

## 2022-08-20 ENCOUNTER — Encounter (HOSPITAL_COMMUNITY): Payer: Self-pay | Admitting: Radiology

## 2022-08-20 ENCOUNTER — Emergency Department (HOSPITAL_COMMUNITY): Payer: Medicare HMO

## 2022-08-20 ENCOUNTER — Inpatient Hospital Stay (HOSPITAL_COMMUNITY)
Admission: EM | Admit: 2022-08-20 | Discharge: 2022-09-05 | DRG: 374 | Disposition: A | Discharge status: Hospice | Payer: Medicare HMO | Attending: Internal Medicine | Admitting: Internal Medicine

## 2022-08-20 ENCOUNTER — Other Ambulatory Visit: Payer: Self-pay

## 2022-08-20 DIAGNOSIS — E1165 Type 2 diabetes mellitus with hyperglycemia: Secondary | ICD-10-CM | POA: Diagnosis present

## 2022-08-20 DIAGNOSIS — J9601 Acute respiratory failure with hypoxia: Secondary | ICD-10-CM | POA: Diagnosis not present

## 2022-08-20 DIAGNOSIS — R935 Abnormal findings on diagnostic imaging of other abdominal regions, including retroperitoneum: Secondary | ICD-10-CM | POA: Diagnosis not present

## 2022-08-20 DIAGNOSIS — R748 Abnormal levels of other serum enzymes: Secondary | ICD-10-CM | POA: Diagnosis present

## 2022-08-20 DIAGNOSIS — Z781 Physical restraint status: Secondary | ICD-10-CM

## 2022-08-20 DIAGNOSIS — R000 Tachycardia, unspecified: Secondary | ICD-10-CM | POA: Diagnosis present

## 2022-08-20 DIAGNOSIS — R1084 Generalized abdominal pain: Secondary | ICD-10-CM | POA: Diagnosis not present

## 2022-08-20 DIAGNOSIS — E872 Acidosis, unspecified: Secondary | ICD-10-CM | POA: Diagnosis not present

## 2022-08-20 DIAGNOSIS — R451 Restlessness and agitation: Secondary | ICD-10-CM | POA: Diagnosis not present

## 2022-08-20 DIAGNOSIS — R140 Abdominal distension (gaseous): Secondary | ICD-10-CM | POA: Diagnosis not present

## 2022-08-20 DIAGNOSIS — F32A Depression, unspecified: Secondary | ICD-10-CM | POA: Diagnosis present

## 2022-08-20 DIAGNOSIS — Z66 Do not resuscitate: Secondary | ICD-10-CM | POA: Diagnosis not present

## 2022-08-20 DIAGNOSIS — R4789 Other speech disturbances: Secondary | ICD-10-CM | POA: Diagnosis present

## 2022-08-20 DIAGNOSIS — R109 Unspecified abdominal pain: Secondary | ICD-10-CM | POA: Diagnosis not present

## 2022-08-20 DIAGNOSIS — K3184 Gastroparesis: Secondary | ICD-10-CM | POA: Diagnosis present

## 2022-08-20 DIAGNOSIS — N179 Acute kidney failure, unspecified: Secondary | ICD-10-CM | POA: Diagnosis not present

## 2022-08-20 DIAGNOSIS — N189 Chronic kidney disease, unspecified: Secondary | ICD-10-CM | POA: Diagnosis not present

## 2022-08-20 DIAGNOSIS — E1143 Type 2 diabetes mellitus with diabetic autonomic (poly)neuropathy: Secondary | ICD-10-CM | POA: Diagnosis present

## 2022-08-20 DIAGNOSIS — F411 Generalized anxiety disorder: Secondary | ICD-10-CM | POA: Diagnosis not present

## 2022-08-20 DIAGNOSIS — K567 Ileus, unspecified: Secondary | ICD-10-CM | POA: Diagnosis present

## 2022-08-20 DIAGNOSIS — R110 Nausea: Secondary | ICD-10-CM | POA: Diagnosis not present

## 2022-08-20 DIAGNOSIS — Z8659 Personal history of other mental and behavioral disorders: Secondary | ICD-10-CM | POA: Insufficient documentation

## 2022-08-20 DIAGNOSIS — R197 Diarrhea, unspecified: Secondary | ICD-10-CM

## 2022-08-20 DIAGNOSIS — K589 Irritable bowel syndrome without diarrhea: Secondary | ICD-10-CM | POA: Diagnosis present

## 2022-08-20 DIAGNOSIS — Z79899 Other long term (current) drug therapy: Secondary | ICD-10-CM

## 2022-08-20 DIAGNOSIS — I10 Essential (primary) hypertension: Secondary | ICD-10-CM | POA: Diagnosis present

## 2022-08-20 DIAGNOSIS — R059 Cough, unspecified: Secondary | ICD-10-CM | POA: Diagnosis not present

## 2022-08-20 DIAGNOSIS — J90 Pleural effusion, not elsewhere classified: Secondary | ICD-10-CM | POA: Diagnosis not present

## 2022-08-20 DIAGNOSIS — G40909 Epilepsy, unspecified, not intractable, without status epilepticus: Secondary | ICD-10-CM | POA: Diagnosis not present

## 2022-08-20 DIAGNOSIS — K5989 Other specified functional intestinal disorders: Secondary | ICD-10-CM | POA: Diagnosis not present

## 2022-08-20 DIAGNOSIS — Z1152 Encounter for screening for COVID-19: Secondary | ICD-10-CM

## 2022-08-20 DIAGNOSIS — R7989 Other specified abnormal findings of blood chemistry: Secondary | ICD-10-CM | POA: Diagnosis present

## 2022-08-20 DIAGNOSIS — R188 Other ascites: Secondary | ICD-10-CM | POA: Diagnosis present

## 2022-08-20 DIAGNOSIS — Z7189 Other specified counseling: Secondary | ICD-10-CM | POA: Diagnosis not present

## 2022-08-20 DIAGNOSIS — N1831 Chronic kidney disease, stage 3a: Secondary | ICD-10-CM | POA: Diagnosis not present

## 2022-08-20 DIAGNOSIS — F410 Panic disorder [episodic paroxysmal anxiety] without agoraphobia: Secondary | ICD-10-CM | POA: Diagnosis present

## 2022-08-20 DIAGNOSIS — K8020 Calculus of gallbladder without cholecystitis without obstruction: Secondary | ICD-10-CM | POA: Diagnosis present

## 2022-08-20 DIAGNOSIS — K5981 Ogilvie syndrome: Secondary | ICD-10-CM | POA: Diagnosis not present

## 2022-08-20 DIAGNOSIS — R609 Edema, unspecified: Secondary | ICD-10-CM | POA: Diagnosis not present

## 2022-08-20 DIAGNOSIS — K30 Functional dyspepsia: Secondary | ICD-10-CM | POA: Diagnosis present

## 2022-08-20 DIAGNOSIS — E1122 Type 2 diabetes mellitus with diabetic chronic kidney disease: Secondary | ICD-10-CM | POA: Diagnosis present

## 2022-08-20 DIAGNOSIS — E43 Unspecified severe protein-calorie malnutrition: Secondary | ICD-10-CM | POA: Diagnosis present

## 2022-08-20 DIAGNOSIS — I959 Hypotension, unspecified: Secondary | ICD-10-CM | POA: Diagnosis not present

## 2022-08-20 DIAGNOSIS — K3189 Other diseases of stomach and duodenum: Secondary | ICD-10-CM | POA: Diagnosis present

## 2022-08-20 DIAGNOSIS — R180 Malignant ascites: Secondary | ICD-10-CM | POA: Diagnosis present

## 2022-08-20 DIAGNOSIS — I129 Hypertensive chronic kidney disease with stage 1 through stage 4 chronic kidney disease, or unspecified chronic kidney disease: Secondary | ICD-10-CM | POA: Diagnosis present

## 2022-08-20 DIAGNOSIS — Z85820 Personal history of malignant melanoma of skin: Secondary | ICD-10-CM

## 2022-08-20 DIAGNOSIS — I69328 Other speech and language deficits following cerebral infarction: Secondary | ICD-10-CM | POA: Diagnosis not present

## 2022-08-20 DIAGNOSIS — R634 Abnormal weight loss: Secondary | ICD-10-CM | POA: Diagnosis present

## 2022-08-20 DIAGNOSIS — R0602 Shortness of breath: Secondary | ICD-10-CM | POA: Diagnosis not present

## 2022-08-20 DIAGNOSIS — Z515 Encounter for palliative care: Secondary | ICD-10-CM | POA: Diagnosis not present

## 2022-08-20 DIAGNOSIS — Z6839 Body mass index (BMI) 39.0-39.9, adult: Secondary | ICD-10-CM | POA: Diagnosis not present

## 2022-08-20 DIAGNOSIS — Z6835 Body mass index (BMI) 35.0-35.9, adult: Secondary | ICD-10-CM

## 2022-08-20 DIAGNOSIS — R1111 Vomiting without nausea: Secondary | ICD-10-CM | POA: Diagnosis not present

## 2022-08-20 DIAGNOSIS — K6389 Other specified diseases of intestine: Secondary | ICD-10-CM | POA: Diagnosis not present

## 2022-08-20 DIAGNOSIS — E538 Deficiency of other specified B group vitamins: Secondary | ICD-10-CM | POA: Diagnosis present

## 2022-08-20 DIAGNOSIS — J189 Pneumonia, unspecified organism: Secondary | ICD-10-CM | POA: Diagnosis not present

## 2022-08-20 DIAGNOSIS — E119 Type 2 diabetes mellitus without complications: Secondary | ICD-10-CM | POA: Diagnosis not present

## 2022-08-20 DIAGNOSIS — N3289 Other specified disorders of bladder: Secondary | ICD-10-CM | POA: Diagnosis not present

## 2022-08-20 DIAGNOSIS — Z5329 Procedure and treatment not carried out because of patient's decision for other reasons: Secondary | ICD-10-CM | POA: Diagnosis present

## 2022-08-20 DIAGNOSIS — R112 Nausea with vomiting, unspecified: Secondary | ICD-10-CM | POA: Diagnosis present

## 2022-08-20 DIAGNOSIS — Z8673 Personal history of transient ischemic attack (TIA), and cerebral infarction without residual deficits: Secondary | ICD-10-CM | POA: Insufficient documentation

## 2022-08-20 DIAGNOSIS — R739 Hyperglycemia, unspecified: Secondary | ICD-10-CM | POA: Diagnosis present

## 2022-08-20 DIAGNOSIS — C786 Secondary malignant neoplasm of retroperitoneum and peritoneum: Secondary | ICD-10-CM | POA: Diagnosis not present

## 2022-08-20 DIAGNOSIS — A419 Sepsis, unspecified organism: Secondary | ICD-10-CM | POA: Diagnosis not present

## 2022-08-20 DIAGNOSIS — K838 Other specified diseases of biliary tract: Secondary | ICD-10-CM | POA: Diagnosis present

## 2022-08-20 DIAGNOSIS — Z4659 Encounter for fitting and adjustment of other gastrointestinal appliance and device: Secondary | ICD-10-CM | POA: Diagnosis not present

## 2022-08-20 DIAGNOSIS — D7589 Other specified diseases of blood and blood-forming organs: Secondary | ICD-10-CM | POA: Diagnosis present

## 2022-08-20 DIAGNOSIS — Z4682 Encounter for fitting and adjustment of non-vascular catheter: Secondary | ICD-10-CM | POA: Diagnosis not present

## 2022-08-20 DIAGNOSIS — E785 Hyperlipidemia, unspecified: Secondary | ICD-10-CM | POA: Diagnosis present

## 2022-08-20 DIAGNOSIS — Z87898 Personal history of other specified conditions: Secondary | ICD-10-CM

## 2022-08-20 DIAGNOSIS — K559 Vascular disorder of intestine, unspecified: Principal | ICD-10-CM | POA: Diagnosis present

## 2022-08-20 DIAGNOSIS — Z7984 Long term (current) use of oral hypoglycemic drugs: Secondary | ICD-10-CM

## 2022-08-20 DIAGNOSIS — C7989 Secondary malignant neoplasm of other specified sites: Secondary | ICD-10-CM | POA: Diagnosis not present

## 2022-08-20 DIAGNOSIS — Z87891 Personal history of nicotine dependence: Secondary | ICD-10-CM

## 2022-08-20 DIAGNOSIS — I160 Hypertensive urgency: Secondary | ICD-10-CM

## 2022-08-20 DIAGNOSIS — I2699 Other pulmonary embolism without acute cor pulmonale: Secondary | ICD-10-CM | POA: Diagnosis not present

## 2022-08-20 DIAGNOSIS — Z743 Need for continuous supervision: Secondary | ICD-10-CM | POA: Diagnosis not present

## 2022-08-20 DIAGNOSIS — Z86711 Personal history of pulmonary embolism: Secondary | ICD-10-CM

## 2022-08-20 DIAGNOSIS — E440 Moderate protein-calorie malnutrition: Secondary | ICD-10-CM | POA: Diagnosis not present

## 2022-08-20 DIAGNOSIS — E669 Obesity, unspecified: Secondary | ICD-10-CM | POA: Diagnosis present

## 2022-08-20 DIAGNOSIS — Z452 Encounter for adjustment and management of vascular access device: Secondary | ICD-10-CM | POA: Diagnosis not present

## 2022-08-20 DIAGNOSIS — K668 Other specified disorders of peritoneum: Secondary | ICD-10-CM | POA: Diagnosis not present

## 2022-08-20 DIAGNOSIS — R7402 Elevation of levels of lactic acid dehydrogenase (LDH): Secondary | ICD-10-CM | POA: Diagnosis present

## 2022-08-20 DIAGNOSIS — R69 Illness, unspecified: Secondary | ICD-10-CM | POA: Diagnosis not present

## 2022-08-20 HISTORY — DX: Morbid (severe) obesity due to excess calories: Z68.35

## 2022-08-20 LAB — COMPREHENSIVE METABOLIC PANEL
ALT: 53 U/L — ABNORMAL HIGH (ref 0–44)
AST: 55 U/L — ABNORMAL HIGH (ref 15–41)
Albumin: 2.8 g/dL — ABNORMAL LOW (ref 3.5–5.0)
Alkaline Phosphatase: 126 U/L (ref 38–126)
Anion gap: 11 (ref 5–15)
BUN: 48 mg/dL — ABNORMAL HIGH (ref 8–23)
CO2: 24 mmol/L (ref 22–32)
Calcium: 8.5 mg/dL — ABNORMAL LOW (ref 8.9–10.3)
Chloride: 99 mmol/L (ref 98–111)
Creatinine, Ser: 2.28 mg/dL — ABNORMAL HIGH (ref 0.44–1.00)
GFR, Estimated: 22 mL/min — ABNORMAL LOW (ref >60)
Glucose, Bld: 170 mg/dL — ABNORMAL HIGH (ref 70–99)
Potassium: 3.9 mmol/L (ref 3.5–5.1)
Sodium: 134 mmol/L — ABNORMAL LOW (ref 135–145)
Total Bilirubin: 2.8 mg/dL — ABNORMAL HIGH (ref 0.3–1.2)
Total Protein: 7.2 g/dL (ref 6.5–8.1)

## 2022-08-20 LAB — CBC WITH DIFFERENTIAL/PLATELET
Abs Immature Granulocytes: 0.22 10*3/uL — ABNORMAL HIGH (ref 0.00–0.07)
Basophils Absolute: 0.1 10*3/uL (ref 0.0–0.1)
Basophils Relative: 0 %
Eosinophils Absolute: 0 10*3/uL (ref 0.0–0.5)
Eosinophils Relative: 0 %
HCT: 43.4 % (ref 36.0–46.0)
Hemoglobin: 14.3 g/dL (ref 12.0–15.0)
Immature Granulocytes: 1 %
Lymphocytes Relative: 6 %
Lymphs Abs: 1.2 10*3/uL (ref 0.7–4.0)
MCH: 31.7 pg (ref 26.0–34.0)
MCHC: 32.9 g/dL (ref 30.0–36.0)
MCV: 96.2 fL (ref 80.0–100.0)
Monocytes Absolute: 1.5 10*3/uL — ABNORMAL HIGH (ref 0.1–1.0)
Monocytes Relative: 7 %
Neutro Abs: 17.3 10*3/uL — ABNORMAL HIGH (ref 1.7–7.7)
Neutrophils Relative %: 86 %
Platelets: 327 10*3/uL (ref 150–400)
RBC: 4.51 MIL/uL (ref 3.87–5.11)
RDW: 12 % (ref 11.5–15.5)
WBC: 20.3 10*3/uL — ABNORMAL HIGH (ref 4.0–10.5)
nRBC: 0 % (ref 0.0–0.2)

## 2022-08-20 LAB — URINALYSIS, ROUTINE W REFLEX MICROSCOPIC
Bilirubin Urine: NEGATIVE
Glucose, UA: NEGATIVE mg/dL
Hgb urine dipstick: NEGATIVE
Ketones, ur: NEGATIVE mg/dL
Nitrite: NEGATIVE
Protein, ur: 30 mg/dL — AB
Specific Gravity, Urine: 1.021 (ref 1.005–1.030)
pH: 5 (ref 5.0–8.0)

## 2022-08-20 LAB — GLUCOSE, CAPILLARY
Glucose-Capillary: 94 mg/dL (ref 70–99)
Glucose-Capillary: 96 mg/dL (ref 70–99)
Glucose-Capillary: 103 mg/dL — ABNORMAL HIGH (ref 70–99)

## 2022-08-20 LAB — HEMOGLOBIN A1C
Hgb A1c MFr Bld: 6.1 % — ABNORMAL HIGH (ref 4.8–5.6)
Mean Plasma Glucose: 128.37 mg/dL

## 2022-08-20 LAB — RESP PANEL BY RT-PCR (RSV, FLU A&B, COVID)  RVPGX2
Influenza A by PCR: NEGATIVE
Influenza B by PCR: NEGATIVE
Resp Syncytial Virus by PCR: NEGATIVE
SARS Coronavirus 2 by RT PCR: NEGATIVE

## 2022-08-20 LAB — TROPONIN I (HIGH SENSITIVITY)
Troponin I (High Sensitivity): 9 ng/L (ref <18)
Troponin I (High Sensitivity): 9 ng/L (ref <18)

## 2022-08-20 LAB — LACTIC ACID, PLASMA
Lactic Acid, Venous: 2.2 mmol/L (ref 0.5–1.9)
Lactic Acid, Venous: 1.9 mmol/L (ref 0.5–1.9)

## 2022-08-20 LAB — CBG MONITORING, ED
Glucose-Capillary: 152 mg/dL — ABNORMAL HIGH (ref 70–99)
Glucose-Capillary: 127 mg/dL — ABNORMAL HIGH (ref 70–99)

## 2022-08-20 LAB — PHOSPHORUS: Phosphorus: 4.1 mg/dL (ref 2.5–4.6)

## 2022-08-20 LAB — PREALBUMIN: Prealbumin: 7 mg/dL — ABNORMAL LOW (ref 18–38)

## 2022-08-20 LAB — LACTATE DEHYDROGENASE: LDH: 118 U/L (ref 98–192)

## 2022-08-20 LAB — MAGNESIUM: Magnesium: 1.9 mg/dL (ref 1.7–2.4)

## 2022-08-20 LAB — MRSA NEXT GEN BY PCR, NASAL: MRSA by PCR Next Gen: NOT DETECTED

## 2022-08-20 LAB — LIPASE, BLOOD: Lipase: 55 U/L — ABNORMAL HIGH (ref 11–51)

## 2022-08-20 MED ORDER — MENTHOL 3 MG MT LOZG: 1.0000 | LOZENGE | OROMUCOSAL | Status: DC | PRN

## 2022-08-20 MED ORDER — ACETAMINOPHEN 650 MG RE SUPP: 650.0000 mg | Freq: Four times a day (QID) | RECTAL | Status: DC | PRN

## 2022-08-20 MED ORDER — MAGIC MOUTHWASH
15.0000 mL | Freq: Four times a day (QID) | ORAL | Status: DC | PRN
  Administered 2022-08-25 – 2022-09-04 (×2): 15 mL via ORAL
  Filled 2022-08-20 (×3): qty 15

## 2022-08-20 MED ORDER — MIDODRINE HCL 5 MG PO TABS
5.0000 mg | ORAL_TABLET | Freq: Once | ORAL | Status: DC
  Filled 2022-08-20: qty 1

## 2022-08-20 MED ORDER — LACTATED RINGERS IV SOLN: INTRAVENOUS | Status: AC

## 2022-08-20 MED ORDER — ALBUMIN HUMAN 5 % IV SOLN
25.0000 g | Freq: Four times a day (QID) | INTRAVENOUS | Status: AC
  Administered 2022-08-21: 25 g via INTRAVENOUS
  Filled 2022-08-20: qty 500

## 2022-08-20 MED ORDER — LACTATED RINGERS IV BOLUS
1000.0000 mL | Freq: Once | INTRAVENOUS | Status: AC
  Administered 2022-08-20: 1000 mL via INTRAVENOUS

## 2022-08-20 MED ORDER — FENTANYL CITRATE PF 50 MCG/ML IJ SOSY
50.0000 ug | PREFILLED_SYRINGE | Freq: Once | INTRAMUSCULAR | Status: AC
  Administered 2022-08-20: 50 ug via INTRAVENOUS
  Filled 2022-08-20: qty 1

## 2022-08-20 MED ORDER — METHOCARBAMOL 1000 MG/10ML IJ SOLN: 1000.0000 mg | Freq: Four times a day (QID) | INTRAVENOUS | Status: DC | PRN

## 2022-08-20 MED ORDER — SODIUM CHLORIDE 0.9 % IV SOLN: 8.0000 mg | Freq: Four times a day (QID) | INTRAVENOUS | Status: DC | PRN

## 2022-08-20 MED ORDER — DIAZEPAM 5 MG/ML IJ SOLN: 4.0000 mg | Freq: Once | INTRAMUSCULAR | Status: DC | PRN

## 2022-08-20 MED ORDER — LACTATED RINGERS IV BOLUS
1000.0000 mL | Freq: Three times a day (TID) | INTRAVENOUS | Status: AC | PRN
  Administered 2022-08-20: 1000 mL via INTRAVENOUS

## 2022-08-20 MED ORDER — PROCHLORPERAZINE EDISYLATE 10 MG/2ML IJ SOLN
5.0000 mg | INTRAMUSCULAR | Status: DC | PRN
  Administered 2022-08-26 – 2022-08-27 (×2): 10 mg via INTRAVENOUS
  Filled 2022-08-20 (×2): qty 2

## 2022-08-20 MED ORDER — LACTATED RINGERS IV BOLUS
800.0000 mL | Freq: Once | INTRAVENOUS | Status: AC
  Administered 2022-08-20: 800 mL via INTRAVENOUS

## 2022-08-20 MED ORDER — INSULIN ASPART 100 UNIT/ML IJ SOLN
0.0000 [IU] | INTRAMUSCULAR | Status: DC
  Administered 2022-08-20: 2 [IU] via SUBCUTANEOUS
  Administered 2022-08-20: 3 [IU] via SUBCUTANEOUS
  Administered 2022-08-21 – 2022-08-24 (×2): 2 [IU] via SUBCUTANEOUS
  Administered 2022-08-25: 5 [IU] via SUBCUTANEOUS
  Administered 2022-08-25: 2 [IU] via SUBCUTANEOUS
  Filled 2022-08-20: qty 0.15

## 2022-08-20 MED ORDER — PIPERACILLIN-TAZOBACTAM 3.375 G IVPB
3.3750 g | Freq: Three times a day (TID) | INTRAVENOUS | Status: AC
  Administered 2022-08-20 – 2022-08-25 (×15): 3.375 g via INTRAVENOUS
  Filled 2022-08-20 (×15): qty 50

## 2022-08-20 MED ORDER — ONDANSETRON HCL 4 MG/2ML IJ SOLN
4.0000 mg | Freq: Four times a day (QID) | INTRAMUSCULAR | Status: DC | PRN
  Administered 2022-08-20 – 2022-09-01 (×10): 4 mg via INTRAVENOUS
  Filled 2022-08-20 (×10): qty 2

## 2022-08-20 MED ORDER — SODIUM CHLORIDE 0.9 % IV SOLN: INTRAVENOUS | Status: DC | PRN

## 2022-08-20 MED ORDER — ONDANSETRON HCL 4 MG/2ML IJ SOLN
4.0000 mg | Freq: Once | INTRAMUSCULAR | Status: AC
  Administered 2022-08-20: 4 mg via INTRAVENOUS
  Filled 2022-08-20: qty 2

## 2022-08-20 MED ORDER — ALUM & MAG HYDROXIDE-SIMETH 200-200-20 MG/5ML PO SUSP
30.0000 mL | Freq: Four times a day (QID) | ORAL | Status: DC | PRN
  Administered 2022-08-25: 30 mL via ORAL
  Filled 2022-08-20: qty 30

## 2022-08-20 MED ORDER — HALOPERIDOL LACTATE 5 MG/ML IJ SOLN: 5.0000 mg | Freq: Once | INTRAMUSCULAR | Status: DC

## 2022-08-20 MED ORDER — LACTATED RINGERS IV BOLUS: 1000.0000 mL | Freq: Three times a day (TID) | INTRAVENOUS | Status: DC | PRN

## 2022-08-20 MED ORDER — ORAL CARE MOUTH RINSE: 15.0000 mL | OROMUCOSAL | Status: DC | PRN

## 2022-08-20 MED ORDER — HYDROMORPHONE HCL 1 MG/ML IJ SOLN
1.0000 mg | INTRAMUSCULAR | Status: DC | PRN
  Administered 2022-08-20 – 2022-08-24 (×13): 1 mg via INTRAVENOUS
  Filled 2022-08-20 (×14): qty 1

## 2022-08-20 MED ORDER — PANTOPRAZOLE SODIUM 40 MG IV SOLR
40.0000 mg | Freq: Two times a day (BID) | INTRAVENOUS | Status: DC
  Administered 2022-08-20 – 2022-09-03 (×29): 40 mg via INTRAVENOUS
  Filled 2022-08-20 (×29): qty 10

## 2022-08-20 MED ORDER — LIP MEDEX EX OINT
TOPICAL_OINTMENT | Freq: Two times a day (BID) | CUTANEOUS | Status: DC
  Administered 2022-08-20 – 2022-08-22 (×4): 75 via TOPICAL
  Administered 2022-08-22: 1 via TOPICAL
  Administered 2022-08-23 – 2022-08-24 (×2): 75 via TOPICAL
  Administered 2022-08-25 – 2022-08-28 (×4): 1 via TOPICAL
  Administered 2022-08-28: 225 via TOPICAL
  Administered 2022-08-29 – 2022-08-31 (×3): 1 via TOPICAL
  Administered 2022-09-02 – 2022-09-04 (×3): 75 via TOPICAL
  Filled 2022-08-20 (×3): qty 7

## 2022-08-20 MED ORDER — HYDROMORPHONE HCL 1 MG/ML IJ SOLN: 0.5000 mg | INTRAMUSCULAR | Status: DC | PRN

## 2022-08-20 MED ORDER — SIMETHICONE 40 MG/0.6ML PO SUSP
80.0000 mg | Freq: Four times a day (QID) | ORAL | Status: DC | PRN
  Administered 2022-08-24 (×2): 80 mg via ORAL
  Filled 2022-08-20 (×4): qty 1.2

## 2022-08-20 MED ORDER — METOCLOPRAMIDE HCL 5 MG/ML IJ SOLN
10.0000 mg | Freq: Once | INTRAMUSCULAR | Status: AC
  Administered 2022-08-20: 10 mg via INTRAVENOUS
  Filled 2022-08-20: qty 2

## 2022-08-20 MED ORDER — PIPERACILLIN-TAZOBACTAM 3.375 G IVPB 30 MIN
3.3750 g | Freq: Once | INTRAVENOUS | Status: AC
  Administered 2022-08-20: 3.375 g via INTRAVENOUS
  Filled 2022-08-20: qty 50

## 2022-08-20 MED ORDER — PHENOL 1.4 % MT LIQD: 2.0000 | OROMUCOSAL | Status: DC | PRN

## 2022-08-20 MED ORDER — CHLORHEXIDINE GLUCONATE CLOTH 2 % EX PADS
6.0000 | MEDICATED_PAD | Freq: Every day | CUTANEOUS | Status: DC
  Administered 2022-08-20 – 2022-09-04 (×16): 6 via TOPICAL

## 2022-08-20 MED ORDER — ACETAMINOPHEN 325 MG PO TABS: 650.0000 mg | ORAL_TABLET | Freq: Four times a day (QID) | ORAL | Status: DC | PRN

## 2022-08-20 MED ORDER — LACTATED RINGERS IV BOLUS
500.0000 mL | Freq: Once | INTRAVENOUS | Status: AC
  Administered 2022-08-20: 500 mL via INTRAVENOUS

## 2022-08-20 NOTE — ED Notes (Signed)
ED TO INPATIENT HANDOFF REPORT  ED Nurse Name and Phone #:  [facility] [place]  S Name/Age/Gender Oren Section 72 y.o. female Room/Bed: WA13/WA13  Code Status   Code Status: Full Code  Home/SNF/Other Home Patient oriented to: self, place, time, and situation Is this baseline? Yes   Triage Complete: Triage complete  Chief Complaint Ischemic colitis [facility]) [K55.9]  Triage Note Patient brought in from home by EMS with c/o ABD pain specifically R side ABD discomfort. She states she has had pain for 3 weeks with ABD distension and pressure in lower ABD area.  Yesterday her diarrhea started and today the N/V started.  110/60 130 18 98% RA CBG: 213   Allergies No Known Allergies  Level of Care/Admitting Diagnosis ED Disposition     ED Disposition  Admit   Condition  --   Comment  Hospital Area: [facility] [[id number]]  Level of Care: Stepdown [14]  Admit to SDU based on following criteria: Severe physiological/psychological symptoms:  Any diagnosis requiring assessment & intervention at least every 4 hours on an ongoing basis to obtain desired patient outcomes including stability and rehabilitation  May admit patient to [facility] or [facility] if equivalent level of care is available:: No  Covid Evaluation: Asymptomatic - no recent exposure (last 10 days) testing not required  Diagnosis: Ischemic colitis [facility]) [id number]  Admitting Physician: Reubin Milan [id number]  Attending Physician: Reubin Milan [id number]  Certification:: I certify this patient will need inpatient services for at least 2 midnights  Estimated Length of Stay: 2          B Medical/Surgery History Past Medical History:  Diagnosis Date   Diabetes mellitus type 2, noninsulin dependent ([place]) 08/20/2022   Generalized anxiety disorder 08/20/2022   History of panic attacks 08/20/2022   History of seizures 2013 08/20/2022   Hypertension    Hypertensive urgency in 2012  08/20/2022   Irritable bowel syndrome (IBS) 08/20/2022   Melanoma of back ([place]) 1997   Severe obesity (BMI 35.0-35.9 with comorbidity) ([place]) 08/20/2022   Past Surgical History:  Procedure Laterality Date   CATARACT EXTRACTION  2015     A IV Location/Drains/Wounds Patient Lines/Drains/Airways Status     Active Line/Drains/Airways     Name Placement date Placement time Site Days   Peripheral IV 08/20/22 20 G 1" Left Antecubital 08/20/22  1045  Antecubital  less than 1            Intake/Output Last 24 hours  Intake/Output Summary (Last 24 hours) at 08/20/2022 1423 Last data filed at 08/20/2022 1419 Gross per 24 hour  Intake 1830.57 ml  Output --  Net 1830.57 ml    Labs/Imaging Results for orders placed or performed during the hospital encounter of 08/20/22 (from the past 48 hour(s))  CBC with Differential     Status: Abnormal   Collection Time: 08/20/22 10:40 AM  Result Value Ref Range   WBC 20.3 (H) 4.0 - 10.5 K/uL   RBC 4.51 3.87 - 5.11 MIL/uL   Hemoglobin 14.3 12.0 - 15.0 g/dL   HCT 43.4 36.0 - 46.0 %   MCV 96.2 80.0 - 100.0 fL   MCH 31.7 26.0 - 34.0 pg   MCHC 32.9 30.0 - 36.0 g/dL   RDW 12.0 11.5 - 15.5 %   Platelets 327 150 - 400 K/uL   nRBC 0.0 0.0 - 0.2 %   Neutrophils Relative % 86 %   Neutro Abs 17.3 (H) 1.7 - 7.7  K/uL   Lymphocytes Relative 6 %   Lymphs Abs 1.2 0.7 - 4.0 K/uL   Monocytes Relative 7 %   Monocytes Absolute 1.5 (H) 0.1 - 1.0 K/uL   Eosinophils Relative 0 %   Eosinophils Absolute 0.0 0.0 - 0.5 K/uL   Basophils Relative 0 %   Basophils Absolute 0.1 0.0 - 0.1 K/uL   Immature Granulocytes 1 %   Abs Immature Granulocytes 0.22 (H) 0.00 - 0.07 K/uL    Comment: Performed at [facility], [place] [address], [place], [place] [address]  Comprehensive metabolic panel     Status: Abnormal   Collection Time: 08/20/22 10:40 AM  Result Value Ref Range   Sodium 134 (L) 135 - 145 mmol/L   Potassium 3.9 3.5 - 5.1 mmol/L   Chloride 99  98 - 111 mmol/L   CO2 24 22 - 32 mmol/L   Glucose, Bld 170 (H) 70 - 99 mg/dL    Comment: Glucose reference range applies only to samples taken after fasting for at least 8 hours.   BUN 48 (H) 8 - 23 mg/dL   Creatinine, Ser 2.28 (H) 0.44 - 1.00 mg/dL   Calcium 8.5 (L) 8.9 - 10.3 mg/dL   Total Protein 7.2 6.5 - 8.1 g/dL   Albumin 2.8 (L) 3.5 - 5.0 g/dL   AST 55 (H) 15 - 41 U/L   ALT 53 (H) 0 - 44 U/L   Alkaline Phosphatase 126 38 - 126 U/L   Total Bilirubin 2.8 (H) 0.3 - 1.2 mg/dL   GFR, Estimated 22 (L) >60 mL/min    Comment: (NOTE) Calculated using the CKD-EPI Creatinine Equation (2021)    Anion gap 11 5 - 15    Comment: Performed at [facility], [place] [address] [place], [place] [address]  Lipase, blood     Status: Abnormal   Collection Time: 08/20/22 10:40 AM  Result Value Ref Range   Lipase 55 (H) 11 - 51 U/L    Comment: Performed at [facility], [place] [address], [place], [place] [address]  Troponin I (High Sensitivity)     Status: None   Collection Time: 08/20/22 10:40 AM  Result Value Ref Range   Troponin I (High Sensitivity) 9 <18 ng/L    Comment: (NOTE) Elevated high sensitivity troponin I (hsTnI) values and significant  changes across serial measurements may suggest ACS but many other  chronic and acute conditions are known to elevate hsTnI results.  Refer to the "Links" section for chest pain algorithms and additional  guidance. Performed at [facility], [place] [address] [place], [place] [address]   Resp panel by RT-PCR (RSV, Flu A&B, Covid) Anterior Nasal Swab     Status: None   Collection Time: 08/20/22 10:47 AM   Specimen: Anterior Nasal Swab  Result Value Ref Range   SARS Coronavirus 2 by RT PCR NEGATIVE NEGATIVE    Comment: (NOTE) SARS-CoV-2 target nucleic acids are NOT DETECTED.  The SARS-CoV-2 RNA is generally detectable in upper respiratory specimens during the acute phase of infection. The  lowest concentration of SARS-CoV-2 viral copies this assay can detect is 138 copies/mL. A negative result does not preclude SARS-Cov-2 infection and should not be used as the sole basis for treatment or other patient management decisions. A negative result may occur with  improper specimen collection/handling, submission of specimen other than nasopharyngeal swab, presence of viral mutation(s) within the areas targeted by this assay, and inadequate number of viral  copies(<138 copies/mL). A negative result must be combined with clinical observations, patient history, and epidemiological information. The expected result is Negative.  Fact Sheet for Patients:  EntrepreneurPulse.com.au  Fact Sheet for Healthcare Providers:  IncredibleEmployment.be  This test is no t yet approved or cleared by the [place] FDA and  has been authorized for detection and/or diagnosis of SARS-CoV-2 by FDA under an Emergency Use Authorization (EUA). This EUA will remain  in effect (meaning this test can be used) for the duration of the COVID-19 declaration under Section 564(b)(1) of the Act, 21 U.S.C.section 360bbb-3(b)(1), unless the authorization is terminated  or revoked sooner.       Influenza A by PCR NEGATIVE NEGATIVE   Influenza B by PCR NEGATIVE NEGATIVE    Comment: (NOTE) The Xpert Xpress SARS-CoV-2/FLU/RSV plus assay is intended as an aid in the diagnosis of influenza from Nasopharyngeal swab specimens and should not be used as a sole basis for treatment. Nasal washings and aspirates are unacceptable for Xpert Xpress SARS-CoV-2/FLU/RSV testing.  Fact Sheet for Patients: EntrepreneurPulse.com.au  Fact Sheet for Healthcare Providers: IncredibleEmployment.be  This test is not yet approved or cleared by the [place] FDA and has been authorized for detection and/or diagnosis of SARS-CoV-2 by FDA under an Emergency  Use Authorization (EUA). This EUA will remain in effect (meaning this test can be used) for the duration of the COVID-19 declaration under Section 564(b)(1) of the Act, 21 U.S.C. section 360bbb-3(b)(1), unless the authorization is terminated or revoked.     Resp Syncytial Virus by PCR NEGATIVE NEGATIVE    Comment: (NOTE) Fact Sheet for Patients: EntrepreneurPulse.com.au  Fact Sheet for Healthcare Providers: IncredibleEmployment.be  This test is not yet approved or cleared by the [place] FDA and has been authorized for detection and/or diagnosis of SARS-CoV-2 by FDA under an Emergency Use Authorization (EUA). This EUA will remain in effect (meaning this test can be used) for the duration of the COVID-19 declaration under Section 564(b)(1) of the Act, 21 U.S.C. section 360bbb-3(b)(1), unless the authorization is terminated or revoked.  Performed at [facility], [place] [address], [place], [place] [address]   Lactic acid, plasma     Status: Abnormal   Collection Time: 08/20/22 11:05 AM  Result Value Ref Range   Lactic Acid, Venous 2.2 (HH) 0.5 - 1.9 mmol/L    Comment: CRITICAL RESULT CALLED TO, READ BACK BY AND VERIFIED WITH SHAW,S. RN [phone] 08/20/22 MULLINS,T Performed at [facility], [place] [address], [place], [place] [address]   Lactic acid, plasma     Status: None   Collection Time: 08/20/22  1:25 PM  Result Value Ref Range   Lactic Acid, Venous 1.9 0.5 - 1.9 mmol/L    Comment: Performed at [facility], [place] [address] [place], [place] [address]  Troponin I (High Sensitivity)     Status: None   Collection Time: 08/20/22  1:25 PM  Result Value Ref Range   Troponin I (High Sensitivity) 9 <18 ng/L    Comment: (NOTE) Elevated high sensitivity troponin I (hsTnI) values and significant  changes across serial measurements may suggest ACS but many other  chronic and acute conditions  are known to elevate hsTnI results.  Refer to the "Links" section for chest pain algorithms and additional  guidance. Performed at [facility], [place] [address], [place], [place] [address]   Lactate dehydrogenase     Status: None   Collection Time: 08/20/22  1:25 PM  Result Value Ref Range   LDH 118 98 - 192 U/L    Comment: Performed at [facility], [place] [address] [place], [place] [address]  Phosphorus     Status: None   Collection Time: 08/20/22  1:25 PM  Result Value Ref Range   Phosphorus 4.1 2.5 - 4.6 mg/dL    Comment: Performed at [facility], [place] [address], [place], [place] [address]  Magnesium     Status: None   Collection Time: 08/20/22  1:25 PM  Result Value Ref Range   Magnesium 1.9 1.7 - 2.4 mg/dL    Comment: Performed at [facility], [place] [address], [place], [place] [address]  CBG monitoring, ED     Status: Abnormal   Collection Time: 08/20/22  1:48 PM  Result Value Ref Range   Glucose-Capillary 152 (H) 70 - 99 mg/dL    Comment: Glucose reference range applies only to samples taken after fasting for at least 8 hours.   CT ABDOMEN PELVIS WO CONTRAST  Result Date: 08/20/2022 CLINICAL DATA:  Right lower quadrant pain.  Sepsis. EXAM: CT ABDOMEN AND PELVIS WITHOUT CONTRAST TECHNIQUE: Multidetector CT imaging of the abdomen and pelvis was performed following the standard protocol without IV contrast. RADIATION DOSE REDUCTION: This exam was performed according to the departmental dose-optimization program which includes automated exposure control, adjustment of the mA and/or kV according to patient size and/or use of iterative reconstruction technique. COMPARISON:  08/15/2010 FINDINGS: Lower chest: No acute findings. Hepatobiliary: No mass visualized on this unenhanced exam. Intrahepatic portal venous gas is seen. Gallstones are seen, however there is no evidence of cholecystitis or biliary dilatation.  Pancreas: No mass or inflammatory process visualized on this unenhanced exam. Spleen:  Within normal limits in size. Adrenals/Urinary tract: No evidence of urolithiasis or hydronephrosis. Unremarkable unopacified urinary bladder. Stomach/Bowel: Small hiatal hernia is seen. No evidence of bowel obstruction. Mild pneumatosis is seen in the gastric wall, however there is no evidence of gastric wall thickening. Small amount of venous gas is seen within the mesentery. No evidence of free intraperitoneal air or abscess. Moderate wall thickening is seen involving the transverse colon, with soft tissue stranding seen throughout the omental fat. Moderate ascites is noted. Vascular/Lymphatic: No pathologically enlarged lymph nodes identified. No evidence of abdominal aortic aneurysm. Aortic atherosclerotic calcification incidentally noted. Reproductive: Mildly enlarged uterus is stable with small fibroids better visualized on previous contrast enhanced exam. Other:  None. Musculoskeletal:  No suspicious bone lesions identified. IMPRESSION: Portal venous and mesenteric venous gas. Mild pneumatosis in the gastric wall, without evidence of gastric wall thickening. Emphysematous gastritis cannot be excluded. Moderate wall thickening involving the transverse colon. Differential diagnosis includes infectious or ischemic colitis, or less likely neoplasm. Moderate ascites and diffuse omental soft tissue stranding. Differential diagnosis includes infectious etiologies and peritoneal carcinomatosis. Cholelithiasis. No radiographic evidence of cholecystitis. Electronically Signed   By: Marlaine Hind M.D.   On: 08/20/2022 12:38    Pending Labs Unresulted Labs (From admission, onward)     Start     Ordered   08/21/22 0500  CBC  Daily,   R      08/20/22 1325   08/21/22 [id number]  Basic metabolic panel  Daily,   R      08/20/22 1325   08/21/22 0500  CBC  Daily,   R      08/20/22 1402   08/21/22 0500  Comprehensive metabolic panel   Daily,   R  08/20/22 1402   08/20/22 1327  Hemoglobin A1c  Once,   URGENT       Comments: To assess prior glycemic control    08/20/22 1326   08/20/22 1326  Prealbumin  Add-on,   AD        08/20/22 1325   08/20/22 1111  Blood culture (routine x 2)  BLOOD CULTURE X 2,   R (with STAT occurrences)      08/20/22 1110   08/20/22 1040  Urinalysis, Routine w reflex microscopic -Urine, Clean Catch  (ED Abdominal Pain)  Once,   URGENT       Question:  Specimen Source  Answer:  Urine, Clean Catch   08/20/22 1039            Vitals/Pain Today's Vitals   08/20/22 1123 08/20/22 1233 08/20/22 1410 08/20/22 1420  BP:  115/77    Pulse:  (!) 111    Resp:  19    Temp:    98.3 F (36.8 C)  TempSrc:    Oral  SpO2:  94%    Weight:      Height:      PainSc: 3   2      Isolation Precautions No active isolations  Medications Medications  lactated ringers infusion ( Intravenous Restarted 08/20/22 1223)  lactated ringers bolus 1,000 mL (has no administration in time range)  piperacillin-tazobactam (ZOSYN) IVPB 3.375 g (has no administration in time range)  HYDROmorphone (DILAUDID) injection 0.5-2 mg (has no administration in time range)  methocarbamol (ROBAXIN) 1,000 mg in dextrose 5 % 100 mL IVPB (has no administration in time range)  ondansetron (ZOFRAN) injection 4 mg (has no administration in time range)    Or  ondansetron (ZOFRAN) 8 mg in sodium chloride 0.9 % 50 mL IVPB (has no administration in time range)  prochlorperazine (COMPAZINE) injection 5-10 mg (has no administration in time range)  lip balm (CARMEX) ointment (75 Applications Topical Given 08/20/22 1417)  phenol (CHLORASEPTIC) mouth spray 2 spray (has no administration in time range)  menthol-cetylpyridinium (CEPACOL) lozenge 3 mg (has no administration in time range)  magic mouthwash (has no administration in time range)  alum & mag hydroxide-simeth (MAALOX/MYLANTA) 200-200-20 MG/5ML suspension 30 mL (has no  administration in time range)  simethicone (MYLICON) 40 [id number] suspension 80 mg (has no administration in time range)  insulin aspart (novoLOG) injection 0-15 Units (3 Units Subcutaneous Given 08/20/22 1418)  pantoprazole (PROTONIX) injection 40 mg (40 mg Intravenous Given 08/20/22 1418)  acetaminophen (TYLENOL) tablet 650 mg (has no administration in time range)    Or  acetaminophen (TYLENOL) suppository 650 mg (has no administration in time range)  fentaNYL (SUBLIMAZE) injection 50 mcg (50 mcg Intravenous Given 08/20/22 1053)  ondansetron (ZOFRAN) injection 4 mg (4 mg Intravenous Given 08/20/22 1052)  lactated ringers bolus 1,000 mL (0 mLs Intravenous Stopped 08/20/22 1324)  piperacillin-tazobactam (ZOSYN) IVPB 3.375 g (0 g Intravenous Stopped 08/20/22 1223)  ondansetron (ZOFRAN) injection 4 mg (4 mg Intravenous Given 08/20/22 1153)  lactated ringers bolus 800 mL (0 mLs Intravenous Stopped 08/20/22 1419)  metoCLOPramide (REGLAN) injection 10 mg (10 mg Intravenous Given 08/20/22 1317)  fentaNYL (SUBLIMAZE) injection 50 mcg (50 mcg Intravenous Given 08/20/22 1318)  lactated ringers bolus 1,000 mL (1,000 mLs Intravenous New Bag/Given 08/20/22 1417)    Mobility walks     Focused Assessments Neuro Assessment Handoff:  Swallow screen pass?  N/A         Neuro Assessment:   Neuro Checks:  Has TPA been given? No If patient is a Neuro Trauma and patient is going to OR before floor call report to [place] nurse: [phone] or [phone]  , N/A   R Recommendations: See Admitting Provider Note  Report given to:   Additional Notes: N/A

## 2022-08-20 NOTE — Sepsis Progress Note (Signed)
Sepsis protocol is being followed by [facility]. 

## 2022-08-20 NOTE — ED Notes (Signed)
RN attempted twice to insert NG tube patient yanked and stated she is not going to allow that. RN explained importance of NG tube, patient refused asking to speak with MD, MD aware.

## 2022-08-20 NOTE — H&P (Signed)
History and Physical    Patient: [name] [id number] DOB: [birth year] DOA: 08/20/2022 DOS: the patient was seen and examined on 08/20/2022 PCP: London Pepper, MD  Patient coming from: Home  Chief Complaint:  Chief Complaint  Patient presents with   Abdominal Pain    Right side   HPI: [name] is a 72 y.o. female with medical history significant of class II obesity, type 2 diabetes, anxiety, depression, panic attacks, history of CVA in 2013, hypertension, cholelithiasis, IBS, history of seizures who is coming to the emergency department with progressively worse pain for the past 3 weeks associated with decreased oral intake, diarrhea, unknown amount of weight loss and multiple episodes of emesis since yesterday.  She also stated her urinary output has been decreased. He denied fever, chills, rhinorrhea, sore throat, wheezing or hemoptysis.  No chest pain, palpitations, diaphoresis, PND, orthopnea or pitting edema of the lower extremities.  No abdominal pain, nausea, emesis, diarrhea, constipation, melena or hematochezia.  No flank pain, dysuria, frequency or hematuria.  No polyuria, polydipsia, polyphagia or blurred vision.   Lab work: CBC showed a white count 20.3, hemoglobin 14.3 g/dL platelets 327.  Lactic acid is 2.2 then 1.9 mmol/L.  Troponin x 2 normal.  Negative coronavirus, influenza and RSV PCR.  Normal LDH, phosphorus and magnesium.  CMP showed normal electrolytes after sodium and calcium correction.  Glucose 170, BUN 48 and creatinine 2.28 mg/dL.  Third blood in 7.2 and albumin 2.8 g/dL.  AST 55 ALT 53 U/L, normal alkaline phosphatase and total bilirubin 2.8 mg/dL.  Imaging: CT abdomen/pelvis without contrast showed portal venous and mesenteric venous gas.  Mild pneumatosis in the gastric wall without evidence of gastric wall thickening.  Emphysematous gastritis cannot be excluded.  Moderate wall thickening involving the transverse colon.  Differential diagnosis includes  infectious or ischemic colitis or less likely neoplasm.  There is moderate arthritis and diffuse omental soft tissue stranding.  Questionable infection splitters to peritoneal carcinomatosis.  There is cholelithiasis without evidence of cholecystitis.   ED course: Initial vital signs were temperature 97.7 F, pulse 124, respiration 18, BP 126/72 mmHg O2 sat 98% on room air.  The patient received Zosyn, fentanyl 50 mcg IVP x 2 LR 2800 mL bolus, metoclopramide 10 mg IVP, ondansetron 4 mg IVP x 2.  Patient declined to get the NG tube inserted.  I offered her to premedicate with diazepam, but she still declined.  Review of Systems: As mentioned in the history of present illness. All other systems reviewed and are negative.  Past Medical History:  Diagnosis Date   Cancer [facility])    Hypertension    Hypertensive urgency in 2012 08/20/2022   No past surgical history on file. Social History:  reports that she has quit smoking. She does not have any smokeless tobacco history on file. No history on file for alcohol use and drug use.  No Known Allergies  No family history on file.  Prior to Admission medications   Medication Sig Start Date End Date Taking? Authorizing Provider  acetaminophen (TYLENOL) 500 MG tablet Take 1,000 mg by mouth every 6 (six) hours as needed for mild pain.   Yes [provider]  ALPRAZolam (XANAX) 0.25 MG tablet Take 0.25 mg by mouth 3 (three) times daily as needed. For anxiety    Yes [provider]  citalopram (CELEXA) 20 MG tablet Take 20 mg by mouth daily.     Yes [provider]  dextromethorphan (DELSYM) 30 MG/5ML liquid Take 60  mg by mouth at bedtime as needed for cough. For cough   Yes [provider]  dicyclomine (BENTYL) 20 MG tablet Take 20 mg by mouth 2 (two) times daily as needed for spasms.   Yes [provider]  famotidine (PEPCID AC MAXIMUM STRENGTH) 20 MG tablet Take 20 mg by mouth daily as needed for heartburn or  indigestion.   Yes [provider]  hydrochlorothiazide (HYDRODIURIL) 12.5 MG tablet Take 12.5 mg by mouth daily. 07/28/22  Yes [provider]  hydroxypropyl methylcellulose (ISOPTO TEARS) 2.5 % ophthalmic solution Place 1 drop into both eyes 3 (three) times daily as needed. For dry eyes    Yes [provider]  loratadine (CLARITIN) 10 MG tablet Take 10 mg by mouth daily.     Yes [provider]  losartan (COZAAR) 100 MG tablet Take 100 mg by mouth daily. 07/28/22  Yes [provider]  metoprolol (LOPRESSOR) 50 MG tablet Take 50 mg by mouth 2 (two) times daily.     Yes [provider]  oxymetazoline (AFRIN) 0.05 % nasal spray Place 1 spray into both nostrils daily as needed for congestion.   Yes [provider]  pravastatin (PRAVACHOL) 10 MG tablet Take 10 mg by mouth at bedtime. 07/19/22  Yes [provider]    Physical Exam: Vitals:   08/20/22 1033 08/20/22 1036 08/20/22 1038 08/20/22 1233  BP:   126/72 115/77  Pulse:   (!) 124 (!) 111  Resp:   18 19  Temp:   97.7 F (36.5 C)   TempSrc:   Oral   SpO2: 98%  95% 94%  Weight:  99.8 kg    Height:  5' 5"$  (1.651 m)     Physical Exam Vitals and nursing note reviewed.  Constitutional:      General: She is awake.     Appearance: She is well-developed. She is obese.  HENT:     Head: Normocephalic.     Nose: No rhinorrhea.     Mouth/Throat:     Mouth: Mucous membranes are moist.  Eyes:     General: No scleral icterus.    Pupils: Pupils are equal, round, and reactive to light.  Neck:     Vascular: No JVD.  Cardiovascular:     Rate and Rhythm: Regular rhythm. Tachycardia present.     Heart sounds: S1 normal and S2 normal.  Pulmonary:     Effort: Pulmonary effort is normal.     Breath sounds: Normal breath sounds. No wheezing, rhonchi or rales.  Abdominal:     General: Abdomen is protuberant. Bowel sounds are decreased. There is distension.     Palpations: Abdomen  is soft.     Tenderness: There is abdominal tenderness in the right lower quadrant. There is right CVA tenderness. There is no guarding or rebound.  Musculoskeletal:     Cervical back: Neck supple.     Right lower leg: No edema.     Left lower leg: No edema.  Skin:    General: Skin is warm and dry.  Neurological:     General: No focal deficit present.     Mental Status: She is alert and oriented to person, place, and time.  Psychiatric:        Mood and Affect: Mood is anxious.        Behavior: Behavior normal. Behavior is cooperative.   Data Reviewed:  Results are pending, will review when available.  Assessment and Plan: Principal Problem:  Nausea, vomiting, and diarrhea Associated with:   Abdominal pain, diffuse Complicated by:   Ascites   Pneumobilia And gastric pneumatosis Keep NPO. Continue IV fluids. Analgesics as needed. Antiemetics as needed. Pantoprazole 40 mg IVP every 24 hours. Keep electrolytes optimized. Follow-up CBC and CMP in AM. Follow-up imaging in the morning. General surgery input appreciated. The patient declined NG tube insertion.  Active Problems:   Cholelithiasis No signs of cholecystitis at this time.    AKI (acute kidney injury) ([place]) Continue IV fluids. Hold ARB/ACE. Avoid hypotension. Avoid nephrotoxins. Monitor intake and output. Monitor renal function and electrolytes.    Hypertension Blood pressures are soft now. Hold antihypertensives.    Morbid obesity ([place]) Has lost significant weight. Currently class II obesity with BMI 36.61 kg/m. Follow-up with primary care and/or bariatric clinic next     Diabetes mellitus type 2, noninsulin dependent ([place]) With:   Hyperglycemia Currently NPO. CBG monitoring with RI SS every 4 hours. Check hemoglobin A1c level.     Generalized anxiety disorder Currently n.p.o. so holding citalopram. Valium ordered as needed for NG tube insertion.    Slow rate of speech - history of CVA  2012 Supportive care.     Advance Care Planning:   Code Status: Full Code   Consults: General surgery Clyda Greener MD)  Family Communication:   Severity of Illness: The appropriate patient status for this patient is INPATIENT. Inpatient status is judged to be reasonable and necessary in order to provide the required intensity of service to ensure the patient's safety. The patient's presenting symptoms, physical exam findings, and initial radiographic and laboratory data in the context of their chronic comorbidities is felt to place them at high risk for further clinical deterioration. Furthermore, it is not anticipated that the patient will be medically stable for discharge from the hospital within 2 midnights of admission.   * I certify that at the point of admission it is my clinical judgment that the patient will require inpatient hospital care spanning beyond 2 midnights from the point of admission due to high intensity of service, high risk for further deterioration and high frequency of surveillance required.*  Author: Reubin Milan, MD 08/20/2022 1:43 PM  For on call review [email or web address].   This document was prepared using Dragon voice recognition software and may contain some unintended transcription errors.

## 2022-08-20 NOTE — Consult Note (Signed)
[name]  Dec 02, 1950 [id number]  CARE TEAM:  PCP: London Pepper, MD  Outpatient Care Team: Patient Care Team: London Pepper, MD as PCP - General (Family Medicine) Renda Rolls, MD as Referring Physician (Surgical Oncology)  Inpatient Treatment Team: Treatment Team: Attending Provider: Reubin Milan, MD; Registered Nurse: Raynelle Dick, RN; Technician: Angelique Holm; Consulting Physician: Nolon Nations, MD; Rounding Team: Jackelyn Knife, MD; Registered Nurse: Virl Cagey, RN   This patient is a 72 y.o.female who presents today for surgical evaluation at the request of Sherrill Raring, [place].   Chief complaint / Reason for evaluation: Abd pain ?ischemic bowel?  72 year old female with numerous medical issues.  Morbid obesity.  Diabetes.  Hypertension.  Seems to have some history of stroke and seizure and 2012 with some delayed speech.  Had some issues with getting insured but followed by Dr. Orland Mustard through the [facility] system intermittently.  Records are rather sparse through epic system.  Patient notes 3-week history of abdominal pain and loose bowel movements.  Patient has history of irritable bowel syndrome normally on Levsin.  Denies any history of ulcers or major heartburn or reflux.  However pain became more intense.  Began to have vomiting yesterday.  Persistent.  Concerning.  Came emergency department.  Feeling better with pain medications.  However elevated creatinine and glucose.  CT scan with ascites and evidence of gastric pneumatosis and pneumobilia without perforation concerning.  Surgical consultation requested.  Patient in ER room lying on her side.  Very large abdomen.  Daughter and nursing at bedside.  Patient notes she is feeling better now.  No NG tube placed yet.  I do not think she has had an abdominal surgery and she cannot recall any.  No history of kidney issues that she is aware of.  I believe her hemoglobin A1c was in the sixes.  Not on any  medications.  On multiple blood pressure medications with history of hypertensive emergency in the past.  She has never had a colonoscopy.  She thinks she saw gastrologist in [place] over a decade ago.  She said "they never found anything".   Assessment  [name]  72 y.o. female       Problem List:  Principal Problem:   Nausea, vomiting, and diarrhea Active Problems:   AKI (acute kidney injury) ([place])   Morbid obesity ([place])   Hyperglycemia   Ascites   Pneumobilia   Diabetes mellitus type 2, noninsulin dependent (HCC)   Generalized anxiety disorder   Slow rate of speech - history of CVA 2012   Abdominal pain, diffuse   Nausea vomiting diarrhea abdominal pain with pneumobilia and gastric pneumatosis of uncertain etiology.  Plan:  Agree with hospitalization.  Medical stabilization.  NPO.  NG tube decompression given nausea vomiting abdominal distention.  IV antibiotics.  Would start with piperacillin/tazobactam to stay broad for now.  Given the fact the pathology seems to be in the upper abdomen would do PPI IV twice daily.  Her distal transverse/splenic flexure/proximal descending colon is involved in the ascites.  However she is lying on her side.  Hard to know if there is any primary pathology there.  It is a hypothetical watershed area but she is not smoking and I do not think there is concern for embolic disease despite her distant history of a stroke.  Defer to medicine on that.  Surgery will help follow.  If she markedly declines or does not improve, she may require  operative exploration to rule out delayed perforation or ischemia/necrosis.  She is not in shock with uncontrolled peritonitis.  She does not appear toxic requiring urgent surgery.  Surgery will follow closely.  VTE prophylaxis- SCDs, etc  Mobilize as tolerated to help recovery  I reviewed nursing notes, ED provider notes, last 24 h vitals and pain scores, last 48 h intake and output, last 24 h  labs and trends, last 24 h imaging results, and CT scan and other lab work.  Prior notes and chart going back over 10 years. . I have reviewed this patient's available data, including medical history, events of note, test results, etc as part of my evaluation.  A significant portion of that time was spent in counseling.  Care during the described time interval was provided by me.  This care required moderate level of medical decision making.  08/20/2022  Adin Hector, MD, FACS, MASCRS Esophageal, Gastrointestinal & Colorectal Surgery Robotic and Minimally Invasive Surgery  [facility] [place] [id number] [place], [place] [address] [phone] Fax [phone] Main  CONTACT INFORMATION:  Weekday (9AM-5PM): Call [facility] main office at [phone]  Weeknight (5PM-9AM) or Weekend/Holiday: Check [email or web address] (password " [email or web address]") for General Surgery [facility] coverage  (Please, do not use SecureChat as it is not reliable communication to reach operating surgeons for immediate patient care given surgeries/outpatient duties/clinic/cross-coverage/off post-call which would lead to a delay in care.  [facility] staff messaging available for outptient concerns, but may not be answered for 48 hours or more).     08/20/2022      Past Medical History:  Diagnosis Date   Diabetes mellitus type 2, noninsulin dependent ([place]) 08/20/2022   Generalized anxiety disorder 08/20/2022   History of panic attacks 08/20/2022   History of seizures 2013 08/20/2022   Hypertension    Hypertensive urgency in 2012 08/20/2022   Irritable bowel syndrome (IBS) 08/20/2022   Melanoma of back ([place]) 1997   Severe obesity (BMI 35.0-35.9 with comorbidity) ([facility]) 08/20/2022    Past Surgical History:  Procedure Laterality Date   CATARACT EXTRACTION  2015    Social History   Socioeconomic History   Marital status: Married    Spouse name: Not on file   Number of  children: Not on file   Years of education: Not on file   Highest education level: Not on file  Occupational History   Not on file  Tobacco Use   Smoking status: Former   Smokeless tobacco: Not on file  Substance and Sexual Activity   Alcohol use: Not on file   Drug use: Not on file   Sexual activity: Not on file  Other Topics Concern   Not on file  Social History Narrative   Not on file   Social Determinants of Health   Financial Resource Strain: Not on file  Food Insecurity: Not on file  Transportation Needs: Not on file  Physical Activity: Not on file  Stress: Not on file  Social Connections: Not on file  Intimate Partner Violence: Not on file    No family history on file.  Current Facility-Administered Medications  Medication Dose Route Frequency Provider Last Rate Last Admin   acetaminophen (TYLENOL) tablet 650 mg  650 mg Oral Q6H PRN Reubin Milan, MD       Or   acetaminophen (TYLENOL) suppository 650 mg  650 mg Rectal Q6H PRN Reubin Milan, MD       alum &  mag hydroxide-simeth (MAALOX/MYLANTA) 200-200-20 MG/5ML suspension 30 mL  30 mL Oral Q6H PRN Reubin Milan, MD       HYDROmorphone (DILAUDID) injection 0.5-2 mg  0.5-2 mg Intravenous Q2H PRN Reubin Milan, MD       insulin aspart (novoLOG) injection 0-15 Units  0-15 Units Subcutaneous Q4H Reubin Milan, MD   3 Units at 08/20/22 1418   lactated ringers bolus 1,000 mL  1,000 mL Intravenous Q8H PRN Reubin Milan, MD       lactated ringers infusion   Intravenous Continuous Reubin Milan, MD 150 mL/hr at 08/20/22 1223 Restarted at 08/20/22 1223   lip balm (CARMEX) ointment   Topical BID Reubin Milan, MD   75 Application at 123XX123 [id number]   magic mouthwash  15 mL Oral QID PRN Reubin Milan, MD       menthol-cetylpyridinium (CEPACOL) lozenge 3 mg  1 lozenge Oral PRN Reubin Milan, MD       methocarbamol (ROBAXIN) 1,000 mg in dextrose 5 % 100 mL IVPB  1,000 mg  Intravenous Q6H PRN Reubin Milan, MD       ondansetron [facility]) injection 4 mg  4 mg Intravenous Q6H PRN Reubin Milan, MD       Or   ondansetron [facility]) 8 mg in sodium chloride 0.9 % 50 mL IVPB  8 mg Intravenous Q6H PRN Reubin Milan, MD       pantoprazole (PROTONIX) injection 40 mg  40 mg Intravenous Q12H Reubin Milan, MD   40 mg at 08/20/22 1418   phenol (CHLORASEPTIC) mouth spray 2 spray  2 spray Mouth/Throat PRN Reubin Milan, MD       piperacillin-tazobactam (ZOSYN) IVPB 3.375 g  3.375 g Intravenous Q8H Reubin Milan, MD       prochlorperazine (COMPAZINE) injection 5-10 mg  5-10 mg Intravenous Q4H PRN Reubin Milan, MD       simethicone [facility]) 40 [id number] suspension 80 mg  80 mg Oral QID PRN Reubin Milan, MD       Current Outpatient Medications  Medication Sig Dispense Refill   acetaminophen (TYLENOL) 500 MG tablet Take 1,000 mg by mouth every 6 (six) hours as needed for mild pain.     ALPRAZolam (XANAX) 0.25 MG tablet Take 0.25 mg by mouth 3 (three) times daily as needed. For anxiety      citalopram (CELEXA) 20 MG tablet Take 20 mg by mouth daily.       dextromethorphan (DELSYM) 30 MG/5ML liquid Take 60 mg by mouth at bedtime as needed for cough. For cough     dicyclomine (BENTYL) 20 MG tablet Take 20 mg by mouth 2 (two) times daily as needed for spasms.     famotidine (PEPCID AC MAXIMUM STRENGTH) 20 MG tablet Take 20 mg by mouth daily as needed for heartburn or indigestion.     hydrochlorothiazide (HYDRODIURIL) 12.5 MG tablet Take 12.5 mg by mouth daily.     hydroxypropyl methylcellulose (ISOPTO TEARS) 2.5 % ophthalmic solution Place 1 drop into both eyes 3 (three) times daily as needed. For dry eyes      loratadine (CLARITIN) 10 MG tablet Take 10 mg by mouth daily.       losartan (COZAAR) 100 MG tablet Take 100 mg by mouth daily.     metoprolol (LOPRESSOR) 50 MG tablet Take 50 mg by mouth 2 (two) times daily.       oxymetazoline  (AFRIN) 0.05 %  nasal spray Place 1 spray into both nostrils daily as needed for congestion.     pravastatin (PRAVACHOL) 10 MG tablet Take 10 mg by mouth at bedtime.       No Known Allergies  ROS:   All other systems reviewed & are negative except per HPI or as noted below: Constitutional:  No fevers, chills, sweats.  Weight stable Eyes:  No vision changes, No discharge HENT:  No sore throats, nasal drainage Lymph: No neck swelling, No bruising easily Pulmonary:  No cough, productive sputum CV: No orthopnea, PND  Patient walks 1 block without difficulty.  No exertional chest/neck/shoulder/arm pain.  GI: No personal nor family history of GI/colon cancer, inflammatory bowel disease, allergy such as Celiac Sprue, dietary/dairy problems, colitis, ulcers nor gastritis.  No recent sick contacts/gastroenteritis.  No travel outside the country.  No changes in diet.  Renal: No UTIs, No hematuria Genital:  No drainage, bleeding, masses Musculoskeletal: No severe joint pain.  Good ROM major joints Skin:  No sores or lesions Heme/Lymph:  No easy bleeding.  No swollen lymph nodes   BP 115/77 (BP Location: Right Arm)   Pulse (!) 111   Temp 98.3 F (36.8 C) (Oral)   Resp 19   Ht 5' 5"$  (1.651 m)   Wt 99.8 kg   SpO2 94%   BMI 36.61 kg/m   Physical Exam:  Constitutional: Not cachectic.  Hygeine adequate.  Vitals signs as above.   Eyes: Pupils reactive, normal extraocular movements. Sclera nonicteric Neuro: CN II-XII intact.  No major focal sensory defects.  No major motor deficits.  Does have some stuttering and delay in answering/slow speech.  This seems to be documented from prior neurology evaluations Lymph: No head/neck/groin lymphadenopathy Psych:  No severe agitation.  No severe anxiety.  Judgment & insight Adequate, Oriented x4, HENT: Normocephalic, Mucus membranes moist.  No thrush.   Neck: Supple, No tracheal deviation.  No obvious thyromegaly Chest: No pain to chest wall  compression.  Good respiratory excursion.  No audible wheezing CV:  Pulses intact.  regular rhythm.  No major extremity edema  Abdomen:  Obese with panniculus Hernia: Not present. Diastasis recti: Mild supraumbilical midline. Somewhat firm.   Moderately distended.  Mild abdominal discomfort in right side of abdomen more than left side.  No guarding or splinting with cough or bed shake.  Argues against any severe peritonitis..  No hepatomegaly.  No splenomegaly  Gen:  Inguinal hernia: Not present.  Inguinal lymph nodes: without lymphadenopathy.    Rectal: (Deferred)  Ext: No obvious deformity or contracture.  Edema: Not present.  No cyanosis Skin: No major subcutaneous nodules.  Warm and dry Musculoskeletal: Severe joint rigidity not present.  No obvious clubbing.  No digital petechiae.     Results:   Labs: Results for orders placed or performed during the hospital encounter of 08/20/22 (from the past 48 hour(s))  CBC with Differential     Status: Abnormal   Collection Time: 08/20/22 10:40 AM  Result Value Ref Range   WBC 20.3 (H) 4.0 - 10.5 K/uL   RBC 4.51 3.87 - 5.11 MIL/uL   Hemoglobin 14.3 12.0 - 15.0 g/dL   HCT 43.4 36.0 - 46.0 %   MCV 96.2 80.0 - 100.0 fL   MCH 31.7 26.0 - 34.0 pg   MCHC 32.9 30.0 - 36.0 g/dL   RDW 12.0 11.5 - 15.5 %   Platelets 327 150 - 400 K/uL   nRBC 0.0 0.0 - 0.2 %  Neutrophils Relative % 86 %   Neutro Abs 17.3 (H) 1.7 - 7.7 K/uL   Lymphocytes Relative 6 %   Lymphs Abs 1.2 0.7 - 4.0 K/uL   Monocytes Relative 7 %   Monocytes Absolute 1.5 (H) 0.1 - 1.0 K/uL   Eosinophils Relative 0 %   Eosinophils Absolute 0.0 0.0 - 0.5 K/uL   Basophils Relative 0 %   Basophils Absolute 0.1 0.0 - 0.1 K/uL   Immature Granulocytes 1 %   Abs Immature Granulocytes 0.22 (H) 0.00 - 0.07 K/uL    Comment: Performed at [facility], [place] [address] [place], [place] [address]  Comprehensive metabolic panel     Status: Abnormal   Collection Time: 08/20/22  10:40 AM  Result Value Ref Range   Sodium 134 (L) 135 - 145 mmol/L   Potassium 3.9 3.5 - 5.1 mmol/L   Chloride 99 98 - 111 mmol/L   CO2 24 22 - 32 mmol/L   Glucose, Bld 170 (H) 70 - 99 mg/dL    Comment: Glucose reference range applies only to samples taken after fasting for at least 8 hours.   BUN 48 (H) 8 - 23 mg/dL   Creatinine, Ser 2.28 (H) 0.44 - 1.00 mg/dL   Calcium 8.5 (L) 8.9 - 10.3 mg/dL   Total Protein 7.2 6.5 - 8.1 g/dL   Albumin 2.8 (L) 3.5 - 5.0 g/dL   AST 55 (H) 15 - 41 U/L   ALT 53 (H) 0 - 44 U/L   Alkaline Phosphatase 126 38 - 126 U/L   Total Bilirubin 2.8 (H) 0.3 - 1.2 mg/dL   GFR, Estimated 22 (L) >60 mL/min    Comment: (NOTE) Calculated using the CKD-EPI Creatinine Equation (2021)    Anion gap 11 5 - 15    Comment: Performed at [facility], [place] [address] [place], [place] [address]  Lipase, blood     Status: Abnormal   Collection Time: 08/20/22 10:40 AM  Result Value Ref Range   Lipase 55 (H) 11 - 51 U/L    Comment: Performed at [facility], [place] [address] [place], [place] [address]  Troponin I (High Sensitivity)     Status: None   Collection Time: 08/20/22 10:40 AM  Result Value Ref Range   Troponin I (High Sensitivity) 9 <18 ng/L    Comment: (NOTE) Elevated high sensitivity troponin I (hsTnI) values and significant  changes across serial measurements may suggest ACS but many other  chronic and acute conditions are known to elevate hsTnI results.  Refer to the "Links" section for chest pain algorithms and additional  guidance. Performed at [facility], [place] [address], [place], [place] [address]   Resp panel by RT-PCR (RSV, Flu A&B, Covid) Anterior Nasal Swab     Status: None   Collection Time: 08/20/22 10:47 AM   Specimen: Anterior Nasal Swab  Result Value Ref Range   SARS Coronavirus 2 by RT PCR NEGATIVE NEGATIVE    Comment: (NOTE) SARS-CoV-2 target nucleic acids are NOT DETECTED.  The  SARS-CoV-2 RNA is generally detectable in upper respiratory specimens during the acute phase of infection. The lowest concentration of SARS-CoV-2 viral copies this assay can detect is 138 copies/mL. A negative result does not preclude SARS-Cov-2 infection and should not be used as the sole basis for treatment or other patient management decisions. A negative result may occur with  improper specimen collection/handling, submission of specimen other than nasopharyngeal swab, presence of  viral mutation(s) within the areas targeted by this assay, and inadequate number of viral copies(<138 copies/mL). A negative result must be combined with clinical observations, patient history, and epidemiological information. The expected result is Negative.  Fact Sheet for Patients:  EntrepreneurPulse.com.au  Fact Sheet for Healthcare Providers:  IncredibleEmployment.be  This test is no t yet approved or cleared by the [place] FDA and  has been authorized for detection and/or diagnosis of SARS-CoV-2 by FDA under an Emergency Use Authorization (EUA). This EUA will remain  in effect (meaning this test can be used) for the duration of the COVID-19 declaration under Section 564(b)(1) of the Act, 21 U.S.C.section 360bbb-3(b)(1), unless the authorization is terminated  or revoked sooner.       Influenza A by PCR NEGATIVE NEGATIVE   Influenza B by PCR NEGATIVE NEGATIVE    Comment: (NOTE) The Xpert Xpress SARS-CoV-2/FLU/RSV plus assay is intended as an aid in the diagnosis of influenza from Nasopharyngeal swab specimens and should not be used as a sole basis for treatment. Nasal washings and aspirates are unacceptable for Xpert Xpress SARS-CoV-2/FLU/RSV testing.  Fact Sheet for Patients: EntrepreneurPulse.com.au  Fact Sheet for Healthcare Providers: IncredibleEmployment.be  This test is not yet approved or cleared by the  [place] FDA and has been authorized for detection and/or diagnosis of SARS-CoV-2 by FDA under an Emergency Use Authorization (EUA). This EUA will remain in effect (meaning this test can be used) for the duration of the COVID-19 declaration under Section 564(b)(1) of the Act, 21 U.S.C. section 360bbb-3(b)(1), unless the authorization is terminated or revoked.     Resp Syncytial Virus by PCR NEGATIVE NEGATIVE    Comment: (NOTE) Fact Sheet for Patients: EntrepreneurPulse.com.au  Fact Sheet for Healthcare Providers: IncredibleEmployment.be  This test is not yet approved or cleared by the [place] FDA and has been authorized for detection and/or diagnosis of SARS-CoV-2 by FDA under an Emergency Use Authorization (EUA). This EUA will remain in effect (meaning this test can be used) for the duration of the COVID-19 declaration under Section 564(b)(1) of the Act, 21 U.S.C. section 360bbb-3(b)(1), unless the authorization is terminated or revoked.  Performed at [facility], [place] [address] [place], [place] [address]   Lactic acid, plasma     Status: Abnormal   Collection Time: 08/20/22 11:05 AM  Result Value Ref Range   Lactic Acid, Venous 2.2 (HH) 0.5 - 1.9 mmol/L    Comment: CRITICAL RESULT CALLED TO, READ BACK BY AND VERIFIED WITH SHAW,S. RN [phone] 08/20/22 MULLINS,T Performed at [facility], [place] [address], [place], [place] [address]   Lactic acid, plasma     Status: None   Collection Time: 08/20/22  1:25 PM  Result Value Ref Range   Lactic Acid, Venous 1.9 0.5 - 1.9 mmol/L    Comment: Performed at [facility], [place] [address] [place], [place] [address]  Troponin I (High Sensitivity)     Status: None   Collection Time: 08/20/22  1:25 PM  Result Value Ref Range   Troponin I (High Sensitivity) 9 <18 ng/L    Comment: (NOTE) Elevated high sensitivity troponin I (hsTnI) values  and significant  changes across serial measurements may suggest ACS but many other  chronic and acute conditions are known to elevate hsTnI results.  Refer to the "Links" section for chest pain algorithms and additional  guidance. Performed at [facility], [place] [address], [place], [place] [address]   Lactate dehydrogenase  Status: None   Collection Time: 08/20/22  1:25 PM  Result Value Ref Range   LDH 118 98 - 192 U/L    Comment: Performed at [facility], [place] [address], [place], [place] [address]  Phosphorus     Status: None   Collection Time: 08/20/22  1:25 PM  Result Value Ref Range   Phosphorus 4.1 2.5 - 4.6 mg/dL    Comment: Performed at [facility], [place] [address], [place], [place] [address]  Magnesium     Status: None   Collection Time: 08/20/22  1:25 PM  Result Value Ref Range   Magnesium 1.9 1.7 - 2.4 mg/dL    Comment: Performed at [facility], [place] [address] [place],  [address]  CBG monitoring, ED     Status: Abnormal   Collection Time: 08/20/22  1:48 PM  Result Value Ref Range   Glucose-Capillary 152 (H) 70 - 99 mg/dL    Comment: Glucose reference range applies only to samples taken after fasting for at least 8 hours.    Imaging / Studies: CT ABDOMEN PELVIS WO CONTRAST  Result Date: 08/20/2022 CLINICAL DATA:  Right lower quadrant pain.  Sepsis. EXAM: CT ABDOMEN AND PELVIS WITHOUT CONTRAST TECHNIQUE: Multidetector CT imaging of the abdomen and pelvis was performed following the standard protocol without IV contrast. RADIATION DOSE REDUCTION: This exam was performed according to the departmental dose-optimization program which includes automated exposure control, adjustment of the mA and/or kV according to patient size and/or use of iterative reconstruction technique. COMPARISON:  08/15/2010 FINDINGS: Lower chest: No acute findings. Hepatobiliary: No mass visualized on this unenhanced  exam. Intrahepatic portal venous gas is seen. Gallstones are seen, however there is no evidence of cholecystitis or biliary dilatation. Pancreas: No mass or inflammatory process visualized on this unenhanced exam. Spleen:  Within normal limits in size. Adrenals/Urinary tract: No evidence of urolithiasis or hydronephrosis. Unremarkable unopacified urinary bladder. Stomach/Bowel: Small hiatal hernia is seen. No evidence of bowel obstruction. Mild pneumatosis is seen in the gastric wall, however there is no evidence of gastric wall thickening. Small amount of venous gas is seen within the mesentery. No evidence of free intraperitoneal air or abscess. Moderate wall thickening is seen involving the transverse colon, with soft tissue stranding seen throughout the omental fat. Moderate ascites is noted. Vascular/Lymphatic: No pathologically enlarged lymph nodes identified. No evidence of abdominal aortic aneurysm. Aortic atherosclerotic calcification incidentally noted. Reproductive: Mildly enlarged uterus is stable with small fibroids better visualized on previous contrast enhanced exam. Other:  None. Musculoskeletal:  No suspicious bone lesions identified. IMPRESSION: Portal venous and mesenteric venous gas. Mild pneumatosis in the gastric wall, without evidence of gastric wall thickening. Emphysematous gastritis cannot be excluded. Moderate wall thickening involving the transverse colon. Differential diagnosis includes infectious or ischemic colitis, or less likely neoplasm. Moderate ascites and diffuse omental soft tissue stranding. Differential diagnosis includes infectious etiologies and peritoneal carcinomatosis. Cholelithiasis. No radiographic evidence of cholecystitis. Electronically Signed   By: Marlaine Hind M.D.   On: 08/20/2022 12:38    Medications / Allergies: per chart  Antibiotics: Anti-infectives (From admission, onward)    Start     Dose/Rate Route Frequency Ordered Stop   08/20/22 2000   piperacillin-tazobactam (ZOSYN) IVPB 3.375 g        3.375 g 12.5 mL/hr over 240 Minutes Intravenous Every 8 hours 08/20/22 1325 08/25/22 1959   08/20/22 1130  piperacillin-tazobactam (ZOSYN) IVPB 3.375 g  3.375 g 100 mL/hr over 30 Minutes Intravenous  Once 08/20/22 1126 08/20/22 1223         Note: Portions of this report may have been transcribed using voice recognition software. Every effort was made to ensure accuracy; however, inadvertent computerized transcription errors may be present.   Any transcriptional errors that result from this process are unintentional.    Adin Hector, MD, FACS, MASCRS Esophageal, Gastrointestinal & Colorectal Surgery Robotic and Minimally Invasive Surgery  [facility]. [address] [place], [place] [address] [phone] Fax [phone] Main  CONTACT INFORMATION:  Weekday (9AM-5PM): Call [facility] main office at [phone]  Weeknight (5PM-9AM) or Weekend/Holiday: Check [email or web address] (password " [email or web address]") for General Surgery [facility] coverage  (Please, do not use SecureChat as it is not reliable communication to reach operating surgeons for immediate patient care given surgeries/outpatient duties/clinic/cross-coverage/off post-call which would lead to a delay in care.  Epic staff messaging available for outptient concerns, but may not be answered for 48 hours or more).      08/20/2022  2:53 PM

## 2022-08-20 NOTE — ED Triage Notes (Signed)
Patient brought in from home by EMS with c/o ABD pain specifically R side ABD discomfort. She states she has had pain for 3 weeks with ABD distension and pressure in lower ABD area.  Yesterday her diarrhea started and today the N/V started.  110/60 130 18 98% RA CBG: 213

## 2022-08-20 NOTE — ED Provider Notes (Signed)
[place] EMERGENCY DEPARTMENT AT [facility] Provider Note   CSN: [id number] Arrival date & time: 08/20/22  1026     History  Chief Complaint  Patient presents with   Abdominal Pain    Right side    [name] is a 72 y.o. female.   Abdominal Pain    This is a 72 year old female history of hyperlipidemia, IBS, hypertension, type 2 diabetes presenting to the emergency department due to right-sided abdominal pain.  This has been going on for the last 2 weeks, its intermittent.  Is worse never she moves, associated with nausea, vomiting and diarrhea.  She is having decreased urinary stream but no dysuria or frank hematuria.  She denies any previous abdominal surgeries, chest pain, shortness of breath, hematemesis, rectal bleeding.  Home Medications Prior to Admission medications   Medication Sig Start Date End Date Taking? Authorizing Provider  acetaminophen (TYLENOL) 500 MG tablet Take 1,000 mg by mouth every 6 (six) hours as needed for mild pain.   Yes [provider]  ALPRAZolam (XANAX) 0.25 MG tablet Take 0.25 mg by mouth 3 (three) times daily as needed. For anxiety    Yes [provider]  citalopram (CELEXA) 20 MG tablet Take 20 mg by mouth daily.     Yes [provider]  dextromethorphan (DELSYM) 30 MG/5ML liquid Take 60 mg by mouth at bedtime as needed for cough. For cough   Yes [provider]  dicyclomine (BENTYL) 20 MG tablet Take 20 mg by mouth 2 (two) times daily as needed for spasms.   Yes [provider]  famotidine (PEPCID AC MAXIMUM STRENGTH) 20 MG tablet Take 20 mg by mouth daily as needed for heartburn or indigestion.   Yes [provider]  hydrochlorothiazide (HYDRODIURIL) 12.5 MG tablet Take 12.5 mg by mouth daily. 07/28/22  Yes [provider]  hydroxypropyl methylcellulose (ISOPTO TEARS) 2.5 % ophthalmic solution Place 1 drop into both eyes 3 (three) times daily as needed. For dry eyes     Yes [provider]  loratadine (CLARITIN) 10 MG tablet Take 10 mg by mouth daily.     Yes [provider]  losartan (COZAAR) 100 MG tablet Take 100 mg by mouth daily. 07/28/22  Yes [provider]  metoprolol (LOPRESSOR) 50 MG tablet Take 50 mg by mouth 2 (two) times daily.     Yes [provider]  oxymetazoline (AFRIN) 0.05 % nasal spray Place 1 spray into both nostrils daily as needed for congestion.   Yes [provider]  pravastatin (PRAVACHOL) 10 MG tablet Take 10 mg by mouth at bedtime. 07/19/22  Yes [provider]      Allergies    Patient has no known allergies.    Review of Systems   Review of Systems  Gastrointestinal:  Positive for abdominal pain.    Physical Exam Updated Vital Signs BP 115/77 (BP Location: Right Arm)   Pulse (!) 111   Temp 97.7 F (36.5 C) (Oral)   Resp 19   Ht 5' 5"$  (1.651 m)   Wt 99.8 kg   SpO2 94%   BMI 36.61 kg/m  Physical Exam Vitals and nursing note reviewed. Exam conducted with a chaperone present.  Constitutional:      Appearance: Normal appearance.  HENT:     Head: Normocephalic and atraumatic.  Eyes:     General: No scleral icterus.       Right eye: No discharge.  Left eye: No discharge.     Extraocular Movements: Extraocular movements intact.     Pupils: Pupils are equal, round, and reactive to light.  Cardiovascular:     Rate and Rhythm: Normal rate and regular rhythm.     Pulses: Normal pulses.     Heart sounds: Normal heart sounds.     No friction rub. No gallop.  Pulmonary:     Effort: Pulmonary effort is normal. No respiratory distress.     Breath sounds: Normal breath sounds.  Abdominal:     General: Abdomen is flat. Bowel sounds are normal. There is distension.     Palpations: Abdomen is soft.     Tenderness: There is abdominal tenderness in the right upper quadrant and right lower quadrant. There is guarding.  Skin:    General: Skin is warm and dry.      Coloration: Skin is not jaundiced.  Neurological:     Mental Status: She is alert. Mental status is at baseline.     Coordination: Coordination normal.     ED Results / Procedures / Treatments   Labs (all labs ordered are listed, but only abnormal results are displayed) Labs Reviewed  CBC WITH DIFFERENTIAL/PLATELET - Abnormal; Notable for the following components:      Result Value   WBC 20.3 (*)    Neutro Abs 17.3 (*)    Monocytes Absolute 1.5 (*)    Abs Immature Granulocytes 0.22 (*)    All other components within normal limits  COMPREHENSIVE METABOLIC PANEL - Abnormal; Notable for the following components:   Sodium 134 (*)    Glucose, Bld 170 (*)    BUN 48 (*)    Creatinine, Ser 2.28 (*)    Calcium 8.5 (*)    Albumin 2.8 (*)    AST 55 (*)    ALT 53 (*)    Total Bilirubin 2.8 (*)    GFR, Estimated 22 (*)    All other components within normal limits  LIPASE, BLOOD - Abnormal; Notable for the following components:   Lipase 55 (*)    All other components within normal limits  LACTIC ACID, PLASMA - Abnormal; Notable for the following components:   Lactic Acid, Venous 2.2 (*)    All other components within normal limits  CBG MONITORING, ED - Abnormal; Notable for the following components:   Glucose-Capillary 152 (*)    All other components within normal limits  RESP PANEL BY RT-PCR (RSV, FLU A&B, COVID)  RVPGX2  CULTURE, BLOOD (ROUTINE X 2)  CULTURE, BLOOD (ROUTINE X 2)  LACTIC ACID, PLASMA  LACTATE DEHYDROGENASE  PHOSPHORUS  MAGNESIUM  URINALYSIS, ROUTINE W REFLEX MICROSCOPIC  PREALBUMIN  HEMOGLOBIN A1C  TROPONIN I (HIGH SENSITIVITY)  TROPONIN I (HIGH SENSITIVITY)    EKG None  Radiology CT ABDOMEN PELVIS WO CONTRAST  Result Date: 08/20/2022 CLINICAL DATA:  Right lower quadrant pain.  Sepsis. EXAM: CT ABDOMEN AND PELVIS WITHOUT CONTRAST TECHNIQUE: Multidetector CT imaging of the abdomen and pelvis was performed following the standard protocol without IV  contrast. RADIATION DOSE REDUCTION: This exam was performed according to the departmental dose-optimization program which includes automated exposure control, adjustment of the mA and/or kV according to patient size and/or use of iterative reconstruction technique. COMPARISON:  08/15/2010 FINDINGS: Lower chest: No acute findings. Hepatobiliary: No mass visualized on this unenhanced exam. Intrahepatic portal venous gas is seen. Gallstones are seen, however there is no evidence of cholecystitis or biliary dilatation. Pancreas: No mass or inflammatory process visualized  on this unenhanced exam. Spleen:  Within normal limits in size. Adrenals/Urinary tract: No evidence of urolithiasis or hydronephrosis. Unremarkable unopacified urinary bladder. Stomach/Bowel: Small hiatal hernia is seen. No evidence of bowel obstruction. Mild pneumatosis is seen in the gastric wall, however there is no evidence of gastric wall thickening. Small amount of venous gas is seen within the mesentery. No evidence of free intraperitoneal air or abscess. Moderate wall thickening is seen involving the transverse colon, with soft tissue stranding seen throughout the omental fat. Moderate ascites is noted. Vascular/Lymphatic: No pathologically enlarged lymph nodes identified. No evidence of abdominal aortic aneurysm. Aortic atherosclerotic calcification incidentally noted. Reproductive: Mildly enlarged uterus is stable with small fibroids better visualized on previous contrast enhanced exam. Other:  None. Musculoskeletal:  No suspicious bone lesions identified. IMPRESSION: Portal venous and mesenteric venous gas. Mild pneumatosis in the gastric wall, without evidence of gastric wall thickening. Emphysematous gastritis cannot be excluded. Moderate wall thickening involving the transverse colon. Differential diagnosis includes infectious or ischemic colitis, or less likely neoplasm. Moderate ascites and diffuse omental soft tissue stranding.  Differential diagnosis includes infectious etiologies and peritoneal carcinomatosis. Cholelithiasis. No radiographic evidence of cholecystitis. Electronically Signed   By: Marlaine Hind M.D.   On: 08/20/2022 12:38    Procedures .Critical Care  Performed by: Sherrill Raring, PA-C Authorized by: Sherrill Raring, PA-C   Critical care provider statement:    Critical care time (minutes):  34   Critical care start time:  08/20/2022 1:40 PM   Critical care end time:  08/20/2022 2:14 PM   Critical care was necessary to treat or prevent imminent or life-threatening deterioration of the following conditions:  Sepsis   Critical care was time spent personally by me on the following activities:  Development of treatment plan with patient or surrogate, discussions with consultants, evaluation of patient's response to treatment, examination of patient, ordering and review of laboratory studies, ordering and review of radiographic studies, ordering and performing treatments and interventions, pulse oximetry, re-evaluation of patient's condition and review of old charts   Care discussed with: admitting provider       Medications Ordered in ED Medications  lactated ringers infusion ( Intravenous Restarted 08/20/22 1223)  lactated ringers bolus 1,000 mL (has no administration in time range)  lactated ringers bolus 1,000 mL (has no administration in time range)  piperacillin-tazobactam (ZOSYN) IVPB 3.375 g (has no administration in time range)  HYDROmorphone (DILAUDID) injection 0.5-2 mg (has no administration in time range)  methocarbamol (ROBAXIN) 1,000 mg in dextrose 5 % 100 mL IVPB (has no administration in time range)  ondansetron (ZOFRAN) injection 4 mg (has no administration in time range)    Or  ondansetron (ZOFRAN) 8 mg in sodium chloride 0.9 % 50 mL IVPB (has no administration in time range)  prochlorperazine (COMPAZINE) injection 5-10 mg (has no administration in time range)  lip balm (CARMEX) ointment (has  no administration in time range)  phenol (CHLORASEPTIC) mouth spray 2 spray (has no administration in time range)  menthol-cetylpyridinium (CEPACOL) lozenge 3 mg (has no administration in time range)  magic mouthwash (has no administration in time range)  alum & mag hydroxide-simeth (MAALOX/MYLANTA) 200-200-20 MG/5ML suspension 30 mL (has no administration in time range)  simethicone (MYLICON) 40 [id number] suspension 80 mg (has no administration in time range)  insulin aspart (novoLOG) injection 0-15 Units (has no administration in time range)  pantoprazole (PROTONIX) injection 40 mg (has no administration in time range)  acetaminophen (TYLENOL) tablet 650 mg (has no  administration in time range)    Or  acetaminophen (TYLENOL) suppository 650 mg (has no administration in time range)  fentaNYL (SUBLIMAZE) injection 50 mcg (50 mcg Intravenous Given 08/20/22 1053)  ondansetron (ZOFRAN) injection 4 mg (4 mg Intravenous Given 08/20/22 1052)  lactated ringers bolus 1,000 mL (0 mLs Intravenous Stopped 08/20/22 1324)  piperacillin-tazobactam (ZOSYN) IVPB 3.375 g (0 g Intravenous Stopped 08/20/22 1223)  ondansetron (ZOFRAN) injection 4 mg (4 mg Intravenous Given 08/20/22 1153)  lactated ringers bolus 800 mL (800 mLs Intravenous New Bag/Given 08/20/22 1317)  metoCLOPramide (REGLAN) injection 10 mg (10 mg Intravenous Given 08/20/22 1317)  fentaNYL (SUBLIMAZE) injection 50 mcg (50 mcg Intravenous Given 08/20/22 1318)    ED Course/ Medical Decision Making/ A&P Clinical Course as of 08/20/22 1414  Sun Aug 20, 2022  1104 CBC with Differential(!) Significantly elevated white count of 20.3 with left shift. [HS]  1128 Lipase, blood(!) Mildly elevated [HS]  1129 Comprehensive metabolic panel(!) Transaminitis with AST and ALT in the 50s, hyperbilirubinemia bili 2.8, patient's creatinine is elevated at 2.28 [HS]  1129 Troponin I (High Sensitivity) Doubt ACS [HS]    Clinical Course User Index [HS] Sherrill Raring,  PA-C                             Medical Decision Making Amount and/or Complexity of Data Reviewed Labs: ordered. Decision-making details documented in ED Course. Radiology: ordered.  Risk Prescription drug management. Decision regarding hospitalization.   This is a 72 year old female presenting to the emergency department due to abdominal pain.  Her exam is peritoneal.  Differential includes not limited to appendicitis, perforated gastric ulcer, cholecystitis, mesenteric colitis.   Patient's daughter is at bedside providing independent history.  Will order fentanyl for pain as well as Zofran for antiemetic.  Patient on cardiac monitoring in sinus tachycardia, will add additional laboratory workup to include lactic and blood culture.   Patient's is leukocytosis with a white count of 20 with left shift, concern for SIRS criteria now being wet so will activate a sepsis patient ordered Zosyn as well as 30 cc/kg.   I reviewed interpreted laboratory workup as documented in ED course. Lactic acid 2.2----1.9  CT abdomen is concerning for ischemic colitis, there is a slight pneumonitis but no obvious perforation.  Will consult general surgery  I consulted general surgery and spoke with Dr. Jerrell Belfast, they recommend NG tube, n.p.o. and IV antibiotics and fluids.  They will consult on the patient.  I consulted the hospitalist service for admission, they accept the patient.        Final Clinical Impression(s) / ED Diagnoses Final diagnoses:  Ischemic colitis [facility])    Rx / [place] Orders ED Discharge Orders     None         Sherrill Raring, PA-C 08/20/22 [place], MD 08/24/22 1512

## 2022-08-20 NOTE — ED Notes (Signed)
Patient transported to CT 

## 2022-08-20 NOTE — Plan of Care (Signed)

## 2022-08-21 ENCOUNTER — Encounter (HOSPITAL_COMMUNITY): Payer: Self-pay | Admitting: Internal Medicine

## 2022-08-21 DIAGNOSIS — R197 Diarrhea, unspecified: Secondary | ICD-10-CM | POA: Diagnosis not present

## 2022-08-21 DIAGNOSIS — R112 Nausea with vomiting, unspecified: Secondary | ICD-10-CM | POA: Diagnosis not present

## 2022-08-21 LAB — CBC
HCT: 34.5 % — ABNORMAL LOW (ref 36.0–46.0)
Hemoglobin: 11.5 g/dL — ABNORMAL LOW (ref 12.0–15.0)
MCH: 32.3 pg (ref 26.0–34.0)
MCHC: 33.3 g/dL (ref 30.0–36.0)
MCV: 96.9 fL (ref 80.0–100.0)
Platelets: 248 10*3/uL (ref 150–400)
RBC: 3.56 MIL/uL — ABNORMAL LOW (ref 3.87–5.11)
RDW: 12 % (ref 11.5–15.5)
WBC: 16.3 10*3/uL — ABNORMAL HIGH (ref 4.0–10.5)
nRBC: 0 % (ref 0.0–0.2)

## 2022-08-21 LAB — GLUCOSE, CAPILLARY
Glucose-Capillary: 104 mg/dL — ABNORMAL HIGH (ref 70–99)
Glucose-Capillary: 111 mg/dL — ABNORMAL HIGH (ref 70–99)
Glucose-Capillary: 124 mg/dL — ABNORMAL HIGH (ref 70–99)
Glucose-Capillary: 82 mg/dL (ref 70–99)
Glucose-Capillary: 95 mg/dL (ref 70–99)
Glucose-Capillary: 95 mg/dL (ref 70–99)

## 2022-08-21 LAB — COMPREHENSIVE METABOLIC PANEL
ALT: 31 U/L (ref 0–44)
AST: 37 U/L (ref 15–41)
Albumin: 2.1 g/dL — ABNORMAL LOW (ref 3.5–5.0)
Alkaline Phosphatase: 81 U/L (ref 38–126)
Anion gap: 13 (ref 5–15)
BUN: 44 mg/dL — ABNORMAL HIGH (ref 8–23)
CO2: 23 mmol/L (ref 22–32)
Calcium: 8.1 mg/dL — ABNORMAL LOW (ref 8.9–10.3)
Chloride: 101 mmol/L (ref 98–111)
Creatinine, Ser: 2.02 mg/dL — ABNORMAL HIGH (ref 0.44–1.00)
GFR, Estimated: 26 mL/min — ABNORMAL LOW (ref >60)
Glucose, Bld: 110 mg/dL — ABNORMAL HIGH (ref 70–99)
Potassium: 3.6 mmol/L (ref 3.5–5.1)
Sodium: 137 mmol/L (ref 135–145)
Total Bilirubin: 2.5 mg/dL — ABNORMAL HIGH (ref 0.3–1.2)
Total Protein: 5.3 g/dL — ABNORMAL LOW (ref 6.5–8.1)

## 2022-08-21 MED ORDER — DEXTROSE IN LACTATED RINGERS 5 % IV SOLN: INTRAVENOUS | Status: DC

## 2022-08-21 MED ORDER — METOPROLOL TARTRATE 5 MG/5ML IV SOLN
2.5000 mg | Freq: Four times a day (QID) | INTRAVENOUS | Status: DC | PRN
  Administered 2022-08-21 (×2): 2.5 mg via INTRAVENOUS
  Filled 2022-08-21: qty 5

## 2022-08-21 MED ORDER — LACTATED RINGERS IV SOLN: INTRAVENOUS | Status: DC

## 2022-08-21 NOTE — Progress Notes (Signed)
Subjective: Patient continues to lay on her left side and states it hurts really bag when she turns to her back and she can't lay on her right side.  Says pain is better than admission.  Denies N/V/D   Objective: Vital signs in last 24 hours: Temp:  [97.7 F (36.5 C)-99.7 F (37.6 C)] 99.2 F (37.3 C) (02/12 0800) Pulse Rate:  [49-141] 122 (02/12 0600) Resp:  [14-29] 22 (02/12 0600) BP: (39-194)/(25-163) 147/68 (02/12 0600) SpO2:  [87 %-100 %] 98 % (02/12 0600) Weight:  [99.8 kg] 99.8 kg (02/11 1036) Last BM Date : 08/20/22  Intake/Output from previous day: 02/11 0701 - 02/12 0700 In: 8148 [I.V.:2445.7; IV Piggyback:5702.3] Out: 350 [Urine:350] Intake/Output this shift: No intake/output data recorded.  PE: Gen: NAD Heart: tachy in 130s Lungs: CTAB Abd: obese, somewhat tense, especially in her upper abdomen, not tympanitic so suspect this may be from ascites, few BS, diffusely tender and patient unable to clarify where more than the other.  No peritonitis.  Lab Results:  Recent Labs    08/20/22 1040 08/21/22 0254  WBC 20.3* 16.3*  HGB 14.3 11.5*  HCT 43.4 34.5*  PLT 327 248   BMET Recent Labs    08/20/22 1040 08/21/22 0254  NA 134* 137  K 3.9 3.6  CL 99 101  CO2 24 23  GLUCOSE 170* 110*  BUN 48* 44*  CREATININE 2.28* 2.02*  CALCIUM 8.5* 8.1*   PT/INR No results for input(s): "LABPROT", "INR" in the last 72 hours. CMP     Component Value Date/Time   NA 137 08/21/2022 0254   K 3.6 08/21/2022 0254   CL 101 08/21/2022 0254   CO2 23 08/21/2022 0254   GLUCOSE 110 (H) 08/21/2022 0254   BUN 44 (H) 08/21/2022 0254   CREATININE 2.02 (H) 08/21/2022 0254   CALCIUM 8.1 (L) 08/21/2022 0254   PROT 5.3 (L) 08/21/2022 0254   ALBUMIN 2.1 (L) 08/21/2022 0254   AST 37 08/21/2022 0254   ALT 31 08/21/2022 0254   ALKPHOS 81 08/21/2022 0254   BILITOT 2.5 (H) 08/21/2022 0254   GFRNONAA 26 (L) 08/21/2022 0254   GFRAA  09/04/2010 1019    >60        The eGFR  has been calculated using the MDRD equation. This calculation has not been validated in all clinical situations. eGFR's persistently <60 mL/min signify possible Chronic Kidney Disease.   Lipase     Component Value Date/Time   LIPASE 55 (H) 08/20/2022 1040       Studies/Results: CT ABDOMEN PELVIS WO CONTRAST  Result Date: 08/20/2022 CLINICAL DATA:  Right lower quadrant pain.  Sepsis. EXAM: CT ABDOMEN AND PELVIS WITHOUT CONTRAST TECHNIQUE: Multidetector CT imaging of the abdomen and pelvis was performed following the standard protocol without IV contrast. RADIATION DOSE REDUCTION: This exam was performed according to the departmental dose-optimization program which includes automated exposure control, adjustment of the mA and/or kV according to patient size and/or use of iterative reconstruction technique. COMPARISON:  08/15/2010 FINDINGS: Lower chest: No acute findings. Hepatobiliary: No mass visualized on this unenhanced exam. Intrahepatic portal venous gas is seen. Gallstones are seen, however there is no evidence of cholecystitis or biliary dilatation. Pancreas: No mass or inflammatory process visualized on this unenhanced exam. Spleen:  Within normal limits in size. Adrenals/Urinary tract: No evidence of urolithiasis or hydronephrosis. Unremarkable unopacified urinary bladder. Stomach/Bowel: Small hiatal hernia is seen. No evidence of bowel obstruction. Mild pneumatosis is seen in  the gastric wall, however there is no evidence of gastric wall thickening. Small amount of venous gas is seen within the mesentery. No evidence of free intraperitoneal air or abscess. Moderate wall thickening is seen involving the transverse colon, with soft tissue stranding seen throughout the omental fat. Moderate ascites is noted. Vascular/Lymphatic: No pathologically enlarged lymph nodes identified. No evidence of abdominal aortic aneurysm. Aortic atherosclerotic calcification incidentally noted. Reproductive:  Mildly enlarged uterus is stable with small fibroids better visualized on previous contrast enhanced exam. Other:  None. Musculoskeletal:  No suspicious bone lesions identified. IMPRESSION: Portal venous and mesenteric venous gas. Mild pneumatosis in the gastric wall, without evidence of gastric wall thickening. Emphysematous gastritis cannot be excluded. Moderate wall thickening involving the transverse colon. Differential diagnosis includes infectious or ischemic colitis, or less likely neoplasm. Moderate ascites and diffuse omental soft tissue stranding. Differential diagnosis includes infectious etiologies and peritoneal carcinomatosis. Cholelithiasis. No radiographic evidence of cholecystitis. Electronically Signed   By: Marlaine Hind M.D.   On: 08/20/2022 12:38    Anti-infectives: Anti-infectives (From admission, onward)    Start     Dose/Rate Route Frequency Ordered Stop   08/20/22 2000  piperacillin-tazobactam (ZOSYN) IVPB 3.375 g        3.375 g 12.5 mL/hr over 240 Minutes Intravenous Every 8 hours 08/20/22 1325 08/25/22 1959   08/20/22 1130  piperacillin-tazobactam (ZOSYN) IVPB 3.375 g        3.375 g 100 mL/hr over 30 Minutes Intravenous  Once 08/20/22 1126 08/20/22 1223        Assessment/Plan Abdominal pain, pneumobilia, gastric pneumatosis, N/V/D -says her pain is better today, but is still very tachy and doesn't want to come off her left side -WBC 16K down from 20K, follow labs in am -N/V/D and some of her abdominal pain seem to be improved from yesterday though. -still concerned about CT findings, ascites, and tenderness that is present.  Will discuss with MD to determine if she needs at least dx lap. -cont abx therapy -cont NPO   FEN - NPO/IVFs VTE - ok for chemical prophylaxis from our standpoint ID - zosyn  DM Hx of seizures HTN IBS Morbid obesity GAD AKI - 2.02 today  I reviewed hospitalist notes, last 24 h vitals and pain scores, last 48 h intake and output,  last 24 h labs and trends, and last 24 h imaging results.   LOS: 1 day    Henreitta Cea , [facility] Surgery 08/21/2022, 8:23 AM Please see Amion for pager number during day hours 7:00am-4:30pm or 7:00am -11:30am on weekends

## 2022-08-21 NOTE — Progress Notes (Addendum)
       Overnight   NAME: Rubi Tooley MRN: [id number] DOB : Aug 10, 1950    Date of Service   08/21/2022   HPI/Events of Note   Notified by RN for hypotension.  72 year old female patient with history of class II obesity, type 2 diabetes, anxiety, depression, panic attacks, history of CVA in 2013, hypertension, cholelithiasis, IBS57 year old female patient with medical history, history of seizures. Admitted through ER with progressively worsening pain for 3 weeks associated with decreased oral intake, diarrhea, unknown amount of weight loss, emesis.  Patient was awake and oriented throughout with blood pressures into the 70s SBP. Initially 500 mL bolus was given with some response. Albumin was given with much improved blood pressure. First dose of albumin was completed, second dose is currently on hold.  Patient is currently making urine; see flowsheet for RN totals.  Patient is asleep and appears in no obvious or stated distress, awakes easily.   Interventions/ Plan   Continue current monitoring Consider second dose of albumin if blood pressure decreases. Consider restarting beta-blocker as able. Continue holding antihypertensives until reassessment. Monitor abdomen - ongoing (patient still refuses NG tube)      Gershon Cull BSN MSNA MSN [place]

## 2022-08-21 NOTE — Progress Notes (Signed)
PROGRESS NOTE    [name]  [id number] DOB: Nov 14, 1950 DOA: 08/20/2022 PCP: London Pepper, MD   Brief Narrative: 72 year old with past medical history significant for class II obesity, diabetes type 2, anxiety, depression, panic attack, history of CVA 2013, hypertension, cholelithiasis, IBS, history of seizure presents complaining of worsening abdominal pain, associated with diarrhea, decreased oral intake, vomiting.  He presented with leukocytosis, AKI, mild transaminases, CT scan showed portal venous and mesenteric venous gas, mild pneumatosis in the gastric wall without evidence of gastric wall thickening.  Concern for emphysematous gastritis.  Or thickening of the transverse colon with differential including infectious or ischemic colitis, less likely neoplasm.  Surgery was consulted recommended n.p.o., NG tube for decompression, IV antibiotics and IV PPI.   Assessment & Plan:   Principal Problem:   Nausea, vomiting, and diarrhea Active Problems:   Hypertension   AKI (acute kidney injury) ([place])   Morbid obesity ([place])   Hyperglycemia   Ascites   Pneumobilia   Diabetes mellitus type 2, noninsulin dependent ([facility])   Generalized anxiety disorder   Cholelithiasis   Slow rate of speech - history of CVA 2012   Abdominal pain, diffuse   1-Pneumobilia, Gastric pneumatosis: Question bowel ischemia Mild elevation Lactic acid.  -Patient presented with abdominal pain, nausea vomiting diarrhea. -CT abdomen pelvis: Portal venous and mesenteric venous gas. Mild pneumatosis in the gastric wall, without evidence of gastric wall thickening. Emphysematous gastritis cannot be excluded.Moderate wall thickening involving the transverse colon. Differential diagnosis includes infectious or ischemic colitis, or less likely neoplasm.Moderate ascites and diffuse omental soft tissue stranding. Differential diagnosis includes infectious etiologies and peritoneal carcinomatosis. -Continue with IV  PPI -N.p.o. -Appreciate  surgery evaluation -IV fluids, IV antibiotics.  Surgery recommend paracentesis. If unrevealing might need laparoscopy Sx.   Hypotension:  SIRS;  Leokocytosis;  UA; 6-10 WBC,.  Responded to IV fluids, albumin.  PRN Metoprolol for tachycardia, hold for SBP less than 100  AKI on CKD stage IIIa: Prior Cr per records 1.1 (2 years ago) Present with CR 2. 2 Continue with IV fluids.   Transaminases, Hyperbilirubinemia:  Suspect related to hypoperfusion.  Monitor.   Hypertension: Hold BP medication. BP soft this am.   Obesity: Needs life style modification.    Diabetes type 2 SSI  History of CVA 2012 resulting in slow rate of speech    Estimated body mass index is 36.61 kg/m as calculated from the following:   Height as of this encounter: 5' 5"$  (1.651 m).   Weight as of this encounter: 99.8 kg.   DVT prophylaxis: SCD Code Status: Full code Family Communication:Will contact sister Disposition Plan:  Status is: Inpatient Remains inpatient appropriate because: management of Abdominal pain, hypotension     Consultants:  General Sx  Procedures:  None  Antimicrobials:    Subjective: She report right side lower quadrant abdominal pain. No further vomiting.   Objective: Vitals:   08/21/22 0300 08/21/22 0400 08/21/22 0500 08/21/22 0600  BP: 138/60 (!) 143/78 (!) 150/73 (!) 147/68  Pulse: (!) 120 (!) 120 (!) 121 (!) 122  Resp: (!) 23 (!) 24 (!) 22 (!) 22  Temp:  99.5 F (37.5 C)    TempSrc:  Oral    SpO2: 93% 95% 97% 98%  Weight:      Height:        Intake/Output Summary (Last 24 hours) at 08/21/2022 0734 Last data filed at 08/21/2022 [id number] Gross per 24 hour  Intake 8147.98 ml  Output 350 ml  Net  7797.98 ml   Filed Weights   08/20/22 1036  Weight: 99.8 kg    Examination:  General exam: Appears calm and comfortable  Respiratory system: Clear to auscultation. Respiratory effort normal. Cardiovascular system: S1 & S2 heard,  RRR. No JVD, murmurs, rubs, gallops or clicks. No pedal edema. Gastrointestinal system: Abdomen is obese , distended , tender.  Central nervous system: Alert and oriented. Extremities: Symmetric 5 x 5 power.    Data Reviewed: I have personally reviewed following labs and imaging studies  CBC: Recent Labs  Lab 08/20/22 1040 08/21/22 0254  WBC 20.3* 16.3*  NEUTROABS 17.3*  --   HGB 14.3 11.5*  HCT 43.4 34.5*  MCV 96.2 96.9  PLT 327 [id number]   Basic Metabolic Panel: Recent Labs  Lab 08/20/22 1040 08/20/22 1325 08/21/22 0254  NA 134*  --  137  K 3.9  --  3.6  CL 99  --  101  CO2 24  --  23  GLUCOSE 170*  --  110*  BUN 48*  --  44*  CREATININE 2.28*  --  2.02*  CALCIUM 8.5*  --  8.1*  MG  --  1.9  --   PHOS  --  4.1  --    GFR: Estimated Creatinine Clearance: 29.4 mL/min (A) (by C-G formula based on SCr of 2.02 mg/dL (H)). Liver Function Tests: Recent Labs  Lab 08/20/22 1040 08/21/22 0254  AST 55* 37  ALT 53* 31  ALKPHOS 126 81  BILITOT 2.8* 2.5*  PROT 7.2 5.3*  ALBUMIN 2.8* 2.1*   Recent Labs  Lab 08/20/22 1040  LIPASE 55*   No results for input(s): "AMMONIA" in the last 168 hours. Coagulation Profile: No results for input(s): "INR", "PROTIME" in the last 168 hours. Cardiac Enzymes: No results for input(s): "CKTOTAL", "CKMB", "CKMBINDEX", "TROPONINI" in the last 168 hours. BNP (last 3 results) No results for input(s): "PROBNP" in the last 8760 hours. HbA1C: Recent Labs    08/20/22 1803  HGBA1C 6.1*   CBG: Recent Labs  Lab 08/20/22 1615 08/20/22 1758 08/20/22 2119 08/20/22 2306 08/21/22 0400  GLUCAP 127* 94 96 103* 124*   Lipid Profile: No results for input(s): "CHOL", "HDL", "LDLCALC", "TRIG", "CHOLHDL", "LDLDIRECT" in the last 72 hours. Thyroid Function Tests: No results for input(s): "TSH", "T4TOTAL", "FREET4", "T3FREE", "THYROIDAB" in the last 72 hours. Anemia Panel: No results for input(s): "VITAMINB12", "FOLATE", "FERRITIN", "TIBC",  "IRON", "RETICCTPCT" in the last 72 hours. Sepsis Labs: Recent Labs  Lab 08/20/22 1105 08/20/22 1325  LATICACIDVEN 2.2* 1.9    Recent Results (from the past 240 hour(s))  Resp panel by RT-PCR (RSV, Flu A&B, Covid) Anterior Nasal Swab     Status: None   Collection Time: 08/20/22 10:47 AM   Specimen: Anterior Nasal Swab  Result Value Ref Range Status   SARS Coronavirus 2 by RT PCR NEGATIVE NEGATIVE Final    Comment: (NOTE) SARS-CoV-2 target nucleic acids are NOT DETECTED.  The SARS-CoV-2 RNA is generally detectable in upper respiratory specimens during the acute phase of infection. The lowest concentration of SARS-CoV-2 viral copies this assay can detect is 138 copies/mL. A negative result does not preclude SARS-Cov-2 infection and should not be used as the sole basis for treatment or other patient management decisions. A negative result may occur with  improper specimen collection/handling, submission of specimen other than nasopharyngeal swab, presence of viral mutation(s) within the areas targeted by this assay, and inadequate number of viral copies(<138 copies/mL). A negative result must be  combined with clinical observations, patient history, and epidemiological information. The expected result is Negative.  Fact Sheet for Patients:  EntrepreneurPulse.com.au  Fact Sheet for Healthcare Providers:  IncredibleEmployment.be  This test is no t yet approved or cleared by the [place] FDA and  has been authorized for detection and/or diagnosis of SARS-CoV-2 by FDA under an Emergency Use Authorization (EUA). This EUA will remain  in effect (meaning this test can be used) for the duration of the COVID-19 declaration under Section 564(b)(1) of the Act, 21 U.S.C.section 360bbb-3(b)(1), unless the authorization is terminated  or revoked sooner.       Influenza A by PCR NEGATIVE NEGATIVE Final   Influenza B by PCR NEGATIVE NEGATIVE Final     Comment: (NOTE) The Xpert Xpress SARS-CoV-2/FLU/RSV plus assay is intended as an aid in the diagnosis of influenza from Nasopharyngeal swab specimens and should not be used as a sole basis for treatment. Nasal washings and aspirates are unacceptable for Xpert Xpress SARS-CoV-2/FLU/RSV testing.  Fact Sheet for Patients: EntrepreneurPulse.com.au  Fact Sheet for Healthcare Providers: IncredibleEmployment.be  This test is not yet approved or cleared by the [place] FDA and has been authorized for detection and/or diagnosis of SARS-CoV-2 by FDA under an Emergency Use Authorization (EUA). This EUA will remain in effect (meaning this test can be used) for the duration of the COVID-19 declaration under Section 564(b)(1) of the Act, 21 U.S.C. section 360bbb-3(b)(1), unless the authorization is terminated or revoked.     Resp Syncytial Virus by PCR NEGATIVE NEGATIVE Final    Comment: (NOTE) Fact Sheet for Patients: EntrepreneurPulse.com.au  Fact Sheet for Healthcare Providers: IncredibleEmployment.be  This test is not yet approved or cleared by the [place] FDA and has been authorized for detection and/or diagnosis of SARS-CoV-2 by FDA under an Emergency Use Authorization (EUA). This EUA will remain in effect (meaning this test can be used) for the duration of the COVID-19 declaration under Section 564(b)(1) of the Act, 21 U.S.C. section 360bbb-3(b)(1), unless the authorization is terminated or revoked.  Performed at [facility], [place] [address] [place], [place] [address]   MRSA Next Gen by PCR, Nasal     Status: None   Collection Time: 08/20/22  5:49 PM   Specimen: Nasal Mucosa; Nasal Swab  Result Value Ref Range Status   MRSA by PCR Next Gen NOT DETECTED NOT DETECTED Final    Comment: (NOTE) The GeneXpert MRSA Assay (FDA approved for NASAL specimens only), is one component  of a comprehensive MRSA colonization surveillance program. It is not intended to diagnose MRSA infection nor to guide or monitor treatment for MRSA infections. Test performance is not FDA approved in patients less than 91 years old. Performed at [facility], [place] [address], [place], [place] [address]          Radiology Studies: CT ABDOMEN PELVIS WO CONTRAST  Result Date: 08/20/2022 CLINICAL DATA:  Right lower quadrant pain.  Sepsis. EXAM: CT ABDOMEN AND PELVIS WITHOUT CONTRAST TECHNIQUE: Multidetector CT imaging of the abdomen and pelvis was performed following the standard protocol without IV contrast. RADIATION DOSE REDUCTION: This exam was performed according to the departmental dose-optimization program which includes automated exposure control, adjustment of the mA and/or kV according to patient size and/or use of iterative reconstruction technique. COMPARISON:  08/15/2010 FINDINGS: Lower chest: No acute findings. Hepatobiliary: No mass visualized on this unenhanced exam. Intrahepatic portal venous gas is seen. Gallstones are seen, however there is no evidence of cholecystitis  or biliary dilatation. Pancreas: No mass or inflammatory process visualized on this unenhanced exam. Spleen:  Within normal limits in size. Adrenals/Urinary tract: No evidence of urolithiasis or hydronephrosis. Unremarkable unopacified urinary bladder. Stomach/Bowel: Small hiatal hernia is seen. No evidence of bowel obstruction. Mild pneumatosis is seen in the gastric wall, however there is no evidence of gastric wall thickening. Small amount of venous gas is seen within the mesentery. No evidence of free intraperitoneal air or abscess. Moderate wall thickening is seen involving the transverse colon, with soft tissue stranding seen throughout the omental fat. Moderate ascites is noted. Vascular/Lymphatic: No pathologically enlarged lymph nodes identified. No evidence of abdominal aortic aneurysm. Aortic  atherosclerotic calcification incidentally noted. Reproductive: Mildly enlarged uterus is stable with small fibroids better visualized on previous contrast enhanced exam. Other:  None. Musculoskeletal:  No suspicious bone lesions identified. IMPRESSION: Portal venous and mesenteric venous gas. Mild pneumatosis in the gastric wall, without evidence of gastric wall thickening. Emphysematous gastritis cannot be excluded. Moderate wall thickening involving the transverse colon. Differential diagnosis includes infectious or ischemic colitis, or less likely neoplasm. Moderate ascites and diffuse omental soft tissue stranding. Differential diagnosis includes infectious etiologies and peritoneal carcinomatosis. Cholelithiasis. No radiographic evidence of cholecystitis. Electronically Signed   By: Marlaine Hind M.D.   On: 08/20/2022 12:38        Scheduled Meds:  Chlorhexidine Gluconate Cloth  6 each Topical Daily   insulin aspart  0-15 Units Subcutaneous Q4H   lip balm   Topical BID   pantoprazole (PROTONIX) IV  40 mg Intravenous Q12H   Continuous Infusions:  sodium chloride Stopped (08/20/22 2011)   albumin human 60 mL/hr at 08/21/22 0509   lactated ringers Stopped (08/20/22 1736)   lactated ringers     methocarbamol (ROBAXIN) IV     ondansetron (ZOFRAN) IV     piperacillin-tazobactam (ZOSYN)  IV 12.5 mL/hr at 08/21/22 0509     LOS: 1 day    Time spent: 35 minutes.     Elmarie Shiley, MD [facility]   If 7PM-7AM, please contact night-coverage [email or web address]  08/21/2022, 7:34 AM

## 2022-08-21 NOTE — Progress Notes (Signed)
Initial Nutrition Assessment  DOCUMENTATION CODES:   Obesity unspecified  INTERVENTION:  - NPO per MD. Advance diet as medically appropriate.  - If unable to advance oral diet in 3-5 days would recommend TPN if appropriate.  - Monitor weight trends.    NUTRITION DIAGNOSIS:   Inadequate oral intake related to inability to eat as evidenced by NPO status.  GOAL:   Patient will meet greater than or equal to 90% of their needs  MONITOR:   Weight trends, Diet advancement  REASON FOR ASSESSMENT:   Malnutrition Screening Tool    ASSESSMENT:   72 year old with past medical history significant for class II obesity, DM2, anxiety, depression, panic attack, history of CVA 2013, HTN, cholelithiasis, IBS, history of seizure who presented complaining of worsening abdominal pain, associated with diarrhea, decreased oral intake, vomiting.  Patient reports a UBW of 220# and that she may have lost some weight recently due to being sick. Per EMR, no weight history prior to this admission but patient weighed at 220#.  Patient reports she typically eats well at home with 2 meals a day, breakfast and dinner. However, she reports she has not been eating well the past month due to ongoing nausea and vomiting.   Patient currently NPO per surgery recommendations. Patient is agreeable to receive nutrition supplements once diet advanced.  Medications reviewed and include: Zofran, Zosyn  Labs reviewed:  Creatinine 2.02 HA1C 6.1   NUTRITION - FOCUSED PHYSICAL EXAM:  Flowsheet Row Most Recent Value  Orbital Region No depletion  Upper Arm Region No depletion  Thoracic and Lumbar Region No depletion  Buccal Region No depletion  Temple Region No depletion  Clavicle Bone Region No depletion  Clavicle and Acromion Bone Region No depletion  Scapular Bone Region Unable to assess  Dorsal Hand No depletion  Patellar Region No depletion  Anterior Thigh Region No depletion  Posterior Calf Region No  depletion  Edema (RD Assessment) None  Hair Reviewed  Eyes Reviewed  Mouth Reviewed  Skin Reviewed  Nails Reviewed       Diet Order:   Diet Order             Diet NPO time specified Except for: Ice Chips  Diet effective now                   EDUCATION NEEDS:  Education needs have been addressed  Skin:  Skin Assessment: Reviewed RN Assessment  Last BM:  2/11  Height:  Ht Readings from Last 1 Encounters:  08/20/22 5' 5"$  (1.651 m)   Weight:  Wt Readings from Last 1 Encounters:  08/20/22 99.8 kg   Ideal Body Weight:  56.82 kg  BMI:  Body mass index is 36.61 kg/m.  Estimated Nutritional Needs:  Kcal:  1800-2000 kcals Protein:  80-100 grams Fluid:  >/= 1.7L    [name] RD, LDN For contact information, refer to [facility].

## 2022-08-22 ENCOUNTER — Encounter (HOSPITAL_COMMUNITY): Payer: Self-pay | Admitting: Radiology

## 2022-08-22 ENCOUNTER — Inpatient Hospital Stay (HOSPITAL_COMMUNITY): Payer: Medicare HMO

## 2022-08-22 DIAGNOSIS — R197 Diarrhea, unspecified: Secondary | ICD-10-CM | POA: Diagnosis not present

## 2022-08-22 DIAGNOSIS — R112 Nausea with vomiting, unspecified: Secondary | ICD-10-CM | POA: Diagnosis not present

## 2022-08-22 LAB — COMPREHENSIVE METABOLIC PANEL
ALT: 35 U/L (ref 0–44)
AST: 51 U/L — ABNORMAL HIGH (ref 15–41)
Albumin: 2.3 g/dL — ABNORMAL LOW (ref 3.5–5.0)
Alkaline Phosphatase: 75 U/L (ref 38–126)
Anion gap: 12 (ref 5–15)
BUN: 33 mg/dL — ABNORMAL HIGH (ref 8–23)
CO2: 22 mmol/L (ref 22–32)
Calcium: 7.9 mg/dL — ABNORMAL LOW (ref 8.9–10.3)
Chloride: 102 mmol/L (ref 98–111)
Creatinine, Ser: 1.53 mg/dL — ABNORMAL HIGH (ref 0.44–1.00)
GFR, Estimated: 36 mL/min — ABNORMAL LOW (ref >60)
Glucose, Bld: 96 mg/dL (ref 70–99)
Potassium: 3.9 mmol/L (ref 3.5–5.1)
Sodium: 136 mmol/L (ref 135–145)
Total Bilirubin: 2.2 mg/dL — ABNORMAL HIGH (ref 0.3–1.2)
Total Protein: 5.3 g/dL — ABNORMAL LOW (ref 6.5–8.1)

## 2022-08-22 LAB — CBC
HCT: 39.7 % (ref 36.0–46.0)
Hemoglobin: 12.5 g/dL (ref 12.0–15.0)
MCH: 31.6 pg (ref 26.0–34.0)
MCHC: 31.5 g/dL (ref 30.0–36.0)
MCV: 100.5 fL — ABNORMAL HIGH (ref 80.0–100.0)
Platelets: 271 10*3/uL (ref 150–400)
RBC: 3.95 MIL/uL (ref 3.87–5.11)
RDW: 12.3 % (ref 11.5–15.5)
WBC: 13.3 10*3/uL — ABNORMAL HIGH (ref 4.0–10.5)
nRBC: 0 % (ref 0.0–0.2)

## 2022-08-22 LAB — GLUCOSE, CAPILLARY
Glucose-Capillary: 101 mg/dL — ABNORMAL HIGH (ref 70–99)
Glucose-Capillary: 106 mg/dL — ABNORMAL HIGH (ref 70–99)
Glucose-Capillary: 68 mg/dL — ABNORMAL LOW (ref 70–99)
Glucose-Capillary: 78 mg/dL (ref 70–99)
Glucose-Capillary: 85 mg/dL (ref 70–99)
Glucose-Capillary: 89 mg/dL (ref 70–99)
Glucose-Capillary: 97 mg/dL (ref 70–99)

## 2022-08-22 LAB — LACTATE DEHYDROGENASE, PLEURAL OR PERITONEAL FLUID: LD, Fluid: 302 U/L — ABNORMAL HIGH (ref 3–23)

## 2022-08-22 LAB — ALBUMIN, PLEURAL OR PERITONEAL FLUID: Albumin, Fluid: 2 g/dL

## 2022-08-22 LAB — BODY FLUID CELL COUNT WITH DIFFERENTIAL
Eos, Fluid: 0 %
Lymphs, Fluid: 19 %
Monocyte-Macrophage-Serous Fluid: 18 % — ABNORMAL LOW (ref 50–90)
Neutrophil Count, Fluid: 63 % — ABNORMAL HIGH (ref 0–25)
Total Nucleated Cell Count, Fluid: 1399 cu mm — ABNORMAL HIGH (ref 0–1000)

## 2022-08-22 LAB — PROTEIN, PLEURAL OR PERITONEAL FLUID: Total protein, fluid: 4.3 g/dL

## 2022-08-22 LAB — GLUCOSE, PLEURAL OR PERITONEAL FLUID: Glucose, Fluid: 70 mg/dL

## 2022-08-22 MED ORDER — DEXTROSE 50 % IV SOLN
INTRAVENOUS | Status: AC
  Administered 2022-08-22: 50 mL
  Filled 2022-08-22: qty 50

## 2022-08-22 MED ORDER — LIDOCAINE HCL 1 % IJ SOLN
INTRAMUSCULAR | Status: AC
  Administered 2022-08-22: 15 mL
  Filled 2022-08-22: qty 20

## 2022-08-22 MED ORDER — LACTATED RINGERS IV BOLUS
500.0000 mL | Freq: Once | INTRAVENOUS | Status: AC
  Administered 2022-08-22: 500 mL via INTRAVENOUS

## 2022-08-22 MED ORDER — DEXTROSE-NACL 5-0.9 % IV SOLN: INTRAVENOUS | Status: DC

## 2022-08-22 MED ORDER — DEXTROSE IN LACTATED RINGERS 5 % IV SOLN: INTRAVENOUS | Status: AC

## 2022-08-22 MED ORDER — DEXTROSE 50 % IV SOLN: 25.0000 mL | Freq: Once | INTRAVENOUS | Status: AC

## 2022-08-22 NOTE — Progress Notes (Signed)
PROGRESS NOTE    [name]  [id number] DOB: March 13, 1951 DOA: 08/20/2022 PCP: London Pepper, MD   Brief Narrative: 72 year old with past medical history significant for class II obesity, diabetes type 2, anxiety, depression, panic attack, history of CVA 2013, hypertension, cholelithiasis, IBS, history of seizure presents complaining of worsening abdominal pain, associated with diarrhea, decreased oral intake, vomiting.  He presented with leukocytosis, AKI, mild transaminases, CT scan showed portal venous and mesenteric venous gas, mild pneumatosis in the gastric wall without evidence of gastric wall thickening.  Concern for emphysematous gastritis.  Or thickening of the transverse colon with differential including infectious or ischemic colitis, less likely neoplasm.  Surgery was consulted recommended n.p.o., NG tube for decompression, IV antibiotics and IV PPI.   Assessment & Plan:   Principal Problem:   Nausea, vomiting, and diarrhea Active Problems:   Hypertension   AKI (acute kidney injury) ([facility])   Morbid obesity ([facility])   Hyperglycemia   Ascites   Pneumobilia   Diabetes mellitus type 2, noninsulin dependent (HCC)   Generalized anxiety disorder   Cholelithiasis   Slow rate of speech - history of CVA 2012   Abdominal pain, diffuse   1-Pneumobilia, Gastric pneumatosis: Question bowel ischemia Mild elevation Lactic acid.  -Patient presented with abdominal pain, nausea vomiting diarrhea. -CT abdomen pelvis: Portal venous and mesenteric venous gas. Mild pneumatosis in the gastric wall, without evidence of gastric wall thickening. Emphysematous gastritis cannot be excluded.Moderate wall thickening involving the transverse colon. Differential diagnosis includes infectious or ischemic colitis, or less likely neoplasm.Moderate ascites and diffuse omental soft tissue stranding. Differential diagnosis includes infectious etiologies and peritoneal carcinomatosis. -Continue with IV  PPI -N.p.o. -Appreciate  surgery evaluation -IV fluids, IV antibiotics.  Surgery recommend paracentesis. If unrevealing might need laparoscopy Sx.  For paracentesis today.  WBC trending down, renal function improved.   Hypotension:  SIRS;  Leokocytosis;  UA; 6-10 WBC,.  Responded to IV fluids, albumin.  PRN Metoprolol for tachycardia, hold for SBP less than 100  AKI on CKD stage IIIa: Prior Cr per records 1.1 (2 years ago) Present with CR 2. 2 Continue with IV fluids.   Transaminases, Hyperbilirubinemia:  Suspect related to hypoperfusion.  Monitor.   Hypertension: Hold BP medication. BP soft this am.   Obesity: Needs life style modification.    Diabetes type 2 SSI  History of CVA 2012 resulting in slow rate of speech    Estimated body mass index is 36.61 kg/m as calculated from the following:   Height as of this encounter: 5' 5"$  (1.651 m).   Weight as of this encounter: 99.8 kg.   DVT prophylaxis: SCD Code Status: Full code Family Communication: sister updated 2/12 Disposition Plan:  Status is: Inpatient Remains inpatient appropriate because: management of Abdominal pain, hypotension     Consultants:  General Sx  Procedures:  None  Antimicrobials:    Subjective: Report pain is the same, is not worse, no further vomiting.   Objective: Vitals:   08/22/22 0400 08/22/22 0500 08/22/22 0506 08/22/22 0600  BP: (!) 142/66  121/78 104/60  Pulse: (!) 107  (!) 123 (!) 121  Resp: 18 (!) 21 (!) 21 13  Temp:      TempSrc:      SpO2: 96%  99% 97%  Weight:      Height:        Intake/Output Summary (Last 24 hours) at 08/22/2022 0651 Last data filed at 08/22/2022 0600 Gross per 24 hour  Intake 2522.41 ml  Output  950 ml  Net 1572.41 ml    Filed Weights   08/20/22 1036  Weight: 99.8 kg    Examination:  General exam: NAD Respiratory system: CTA Cardiovascular system: S 1. S 2 RRR Gastrointestinal system: BS decreased, distended, tender,   Central nervous system: Alert, and oriented.  Extremities: no edema    Data Reviewed: I have personally reviewed following labs and imaging studies  CBC: Recent Labs  Lab 08/20/22 1040 08/21/22 0254 08/22/22 0249  WBC 20.3* 16.3* 13.3*  NEUTROABS 17.3*  --   --   HGB 14.3 11.5* 12.5  HCT 43.4 34.5* 39.7  MCV 96.2 96.9 100.5*  PLT 327 248 [id number]    Basic Metabolic Panel: Recent Labs  Lab 08/20/22 1040 08/20/22 1325 08/21/22 0254 08/22/22 0249  NA 134*  --  137 136  K 3.9  --  3.6 3.9  CL 99  --  101 102  CO2 24  --  23 22  GLUCOSE 170*  --  110* 96  BUN 48*  --  44* 33*  CREATININE 2.28*  --  2.02* 1.53*  CALCIUM 8.5*  --  8.1* 7.9*  MG  --  1.9  --   --   PHOS  --  4.1  --   --     GFR: Estimated Creatinine Clearance: 38.9 mL/min (A) (by C-G formula based on SCr of 1.53 mg/dL (H)). Liver Function Tests: Recent Labs  Lab 08/20/22 1040 08/21/22 0254 08/22/22 0249  AST 55* 37 51*  ALT 53* 31 35  ALKPHOS 126 81 75  BILITOT 2.8* 2.5* 2.2*  PROT 7.2 5.3* 5.3*  ALBUMIN 2.8* 2.1* 2.3*    Recent Labs  Lab 08/20/22 1040  LIPASE 55*    No results for input(s): "AMMONIA" in the last 168 hours. Coagulation Profile: No results for input(s): "INR", "PROTIME" in the last 168 hours. Cardiac Enzymes: No results for input(s): "CKTOTAL", "CKMB", "CKMBINDEX", "TROPONINI" in the last 168 hours. BNP (last 3 results) No results for input(s): "PROBNP" in the last 8760 hours. HbA1C: Recent Labs    08/20/22 1803  HGBA1C 6.1*    CBG: Recent Labs  Lab 08/21/22 1133 08/21/22 1545 08/21/22 1935 08/21/22 2334 08/22/22 0432  GLUCAP 111* 95 95 82 97    Lipid Profile: No results for input(s): "CHOL", "HDL", "LDLCALC", "TRIG", "CHOLHDL", "LDLDIRECT" in the last 72 hours. Thyroid Function Tests: No results for input(s): "TSH", "T4TOTAL", "FREET4", "T3FREE", "THYROIDAB" in the last 72 hours. Anemia Panel: No results for input(s): "VITAMINB12", "FOLATE",  "FERRITIN", "TIBC", "IRON", "RETICCTPCT" in the last 72 hours. Sepsis Labs: Recent Labs  Lab 08/20/22 1105 08/20/22 1325  LATICACIDVEN 2.2* 1.9     Recent Results (from the past 240 hour(s))  Resp panel by RT-PCR (RSV, Flu A&B, Covid) Anterior Nasal Swab     Status: None   Collection Time: 08/20/22 10:47 AM   Specimen: Anterior Nasal Swab  Result Value Ref Range Status   SARS Coronavirus 2 by RT PCR NEGATIVE NEGATIVE Final    Comment: (NOTE) SARS-CoV-2 target nucleic acids are NOT DETECTED.  The SARS-CoV-2 RNA is generally detectable in upper respiratory specimens during the acute phase of infection. The lowest concentration of SARS-CoV-2 viral copies this assay can detect is 138 copies/mL. A negative result does not preclude SARS-Cov-2 infection and should not be used as the sole basis for treatment or other patient management decisions. A negative result may occur with  improper specimen collection/handling, submission of specimen other than nasopharyngeal swab, presence  of viral mutation(s) within the areas targeted by this assay, and inadequate number of viral copies(<138 copies/mL). A negative result must be combined with clinical observations, patient history, and epidemiological information. The expected result is Negative.  Fact Sheet for Patients:  EntrepreneurPulse.com.au  Fact Sheet for Healthcare Providers:  IncredibleEmployment.be  This test is no t yet approved or cleared by the [place] FDA and  has been authorized for detection and/or diagnosis of SARS-CoV-2 by FDA under an Emergency Use Authorization (EUA). This EUA will remain  in effect (meaning this test can be used) for the duration of the COVID-19 declaration under Section 564(b)(1) of the Act, 21 U.S.C.section 360bbb-3(b)(1), unless the authorization is terminated  or revoked sooner.       Influenza A by PCR NEGATIVE NEGATIVE Final   Influenza B by PCR  NEGATIVE NEGATIVE Final    Comment: (NOTE) The Xpert Xpress SARS-CoV-2/FLU/RSV plus assay is intended as an aid in the diagnosis of influenza from Nasopharyngeal swab specimens and should not be used as a sole basis for treatment. Nasal washings and aspirates are unacceptable for Xpert Xpress SARS-CoV-2/FLU/RSV testing.  Fact Sheet for Patients: EntrepreneurPulse.com.au  Fact Sheet for Healthcare Providers: IncredibleEmployment.be  This test is not yet approved or cleared by the [place] FDA and has been authorized for detection and/or diagnosis of SARS-CoV-2 by FDA under an Emergency Use Authorization (EUA). This EUA will remain in effect (meaning this test can be used) for the duration of the COVID-19 declaration under Section 564(b)(1) of the Act, 21 U.S.C. section 360bbb-3(b)(1), unless the authorization is terminated or revoked.     Resp Syncytial Virus by PCR NEGATIVE NEGATIVE Final    Comment: (NOTE) Fact Sheet for Patients: EntrepreneurPulse.com.au  Fact Sheet for Healthcare Providers: IncredibleEmployment.be  This test is not yet approved or cleared by the [place] FDA and has been authorized for detection and/or diagnosis of SARS-CoV-2 by FDA under an Emergency Use Authorization (EUA). This EUA will remain in effect (meaning this test can be used) for the duration of the COVID-19 declaration under Section 564(b)(1) of the Act, 21 U.S.C. section 360bbb-3(b)(1), unless the authorization is terminated or revoked.  Performed at [facility], [place] [address], [place], [place] [address]   Blood culture (routine x 2)     Status: None (Preliminary result)   Collection Time: 08/20/22 11:15 AM   Specimen: BLOOD  Result Value Ref Range Status   Specimen Description   Final    BLOOD LEFT ANTECUBITAL Performed at [facility] [address]  [place], [place] [address]    Special Requests   Final    BOTTLES DRAWN AEROBIC AND ANAEROBIC Blood Culture adequate volume Performed at [facility] [address], [place], [place] [address]    Culture   Final    NO GROWTH < 24 HOURS Performed at [facility] [address] [place], [place] [address]    Report Status PENDING  Incomplete  Blood culture (routine x 2)     Status: None (Preliminary result)   Collection Time: 08/20/22 11:20 AM   Specimen: BLOOD  Result Value Ref Range Status   Specimen Description   Final    BLOOD BLOOD RIGHT HAND Performed at [facility] [address], [place], [place] [address]    Special Requests   Final    BOTTLES DRAWN AEROBIC AND ANAEROBIC Blood Culture results may not be optimal due to an inadequate volume of  blood received in culture bottles Performed at [facility], [place] [address], [place], [place] [address]    Culture   Final    NO GROWTH < 24 HOURS Performed at [facility] [address] [place], [place] [address]    Report Status PENDING  Incomplete  MRSA Next Gen by PCR, Nasal     Status: None   Collection Time: 08/20/22  5:49 PM   Specimen: Nasal Mucosa; Nasal Swab  Result Value Ref Range Status   MRSA by PCR Next Gen NOT DETECTED NOT DETECTED Final    Comment: (NOTE) The GeneXpert MRSA Assay (FDA approved for NASAL specimens only), is one component of a comprehensive MRSA colonization surveillance program. It is not intended to diagnose MRSA infection nor to guide or monitor treatment for MRSA infections. Test performance is not FDA approved in patients less than 40 years old. Performed at [facility], [place] [address], [place], [place] [address]          Radiology Studies: CT ABDOMEN PELVIS WO CONTRAST  Result Date: 08/20/2022 CLINICAL DATA:  Right lower quadrant pain.  Sepsis. EXAM: CT ABDOMEN AND PELVIS WITHOUT CONTRAST  TECHNIQUE: Multidetector CT imaging of the abdomen and pelvis was performed following the standard protocol without IV contrast. RADIATION DOSE REDUCTION: This exam was performed according to the departmental dose-optimization program which includes automated exposure control, adjustment of the mA and/or kV according to patient size and/or use of iterative reconstruction technique. COMPARISON:  08/15/2010 FINDINGS: Lower chest: No acute findings. Hepatobiliary: No mass visualized on this unenhanced exam. Intrahepatic portal venous gas is seen. Gallstones are seen, however there is no evidence of cholecystitis or biliary dilatation. Pancreas: No mass or inflammatory process visualized on this unenhanced exam. Spleen:  Within normal limits in size. Adrenals/Urinary tract: No evidence of urolithiasis or hydronephrosis. Unremarkable unopacified urinary bladder. Stomach/Bowel: Small hiatal hernia is seen. No evidence of bowel obstruction. Mild pneumatosis is seen in the gastric wall, however there is no evidence of gastric wall thickening. Small amount of venous gas is seen within the mesentery. No evidence of free intraperitoneal air or abscess. Moderate wall thickening is seen involving the transverse colon, with soft tissue stranding seen throughout the omental fat. Moderate ascites is noted. Vascular/Lymphatic: No pathologically enlarged lymph nodes identified. No evidence of abdominal aortic aneurysm. Aortic atherosclerotic calcification incidentally noted. Reproductive: Mildly enlarged uterus is stable with small fibroids better visualized on previous contrast enhanced exam. Other:  None. Musculoskeletal:  No suspicious bone lesions identified. IMPRESSION: Portal venous and mesenteric venous gas. Mild pneumatosis in the gastric wall, without evidence of gastric wall thickening. Emphysematous gastritis cannot be excluded. Moderate wall thickening involving the transverse colon. Differential diagnosis includes  infectious or ischemic colitis, or less likely neoplasm. Moderate ascites and diffuse omental soft tissue stranding. Differential diagnosis includes infectious etiologies and peritoneal carcinomatosis. Cholelithiasis. No radiographic evidence of cholecystitis. Electronically Signed   By: Marlaine Hind M.D.   On: 08/20/2022 12:38        Scheduled Meds:  Chlorhexidine Gluconate Cloth  6 each Topical Daily   insulin aspart  0-15 Units Subcutaneous Q4H   lip balm   Topical BID   pantoprazole (PROTONIX) IV  40 mg Intravenous Q12H   Continuous Infusions:  sodium chloride Stopped (08/22/22 0531)   lactated ringers Stopped (08/20/22 1736)   lactated ringers 100 mL/hr at 08/22/22 0600   methocarbamol (ROBAXIN) IV     ondansetron (ZOFRAN) IV  piperacillin-tazobactam (ZOSYN)  IV 12.5 mL/hr at 08/22/22 0600     LOS: 2 days    Time spent: 35 minutes.     Elmarie Shiley, MD [facility]   If 7PM-7AM, please contact night-coverage [email or web address]  08/22/2022, 6:51 AM

## 2022-08-22 NOTE — Progress Notes (Signed)
  Transition of Care [facility]) Screening Note   Patient Details  Name: [name] Date of Birth: [birth year]   Transition of Care [facility] [place] [facility]) CM/SW Contact:    Roseanne Kaufman, RN Phone Number: 08/22/2022, 6:20 PM    Transition of Care Department [facility]) has reviewed patient and no TOC needs have been identified at this time. We will continue to monitor patient advancement through interdisciplinary progression rounds. If new patient transition needs arise, please place a TOC consult.

## 2022-08-22 NOTE — Progress Notes (Signed)
Subjective: Patient continues to lay on her left side but says her pain is better in that she has no further N/V.  She describes her pain as mostly like gas pain.     Objective: Vital signs in last 24 hours: Temp:  [97.8 F (36.6 C)-99.4 F (37.4 C)] 97.8 F (36.6 C) (02/13 0754) Pulse Rate:  [107-135] 114 (02/13 0800) Resp:  [13-26] 14 (02/13 0800) BP: (103-185)/(51-86) 103/51 (02/13 0800) SpO2:  [95 %-100 %] 96 % (02/13 0800) Last BM Date : 08/20/22  Intake/Output from previous day: 02/12 0701 - 02/13 0700 In: 2522.4 [I.V.:2191.2; IV Piggyback:331.2] Out: 950 [Urine:950] Intake/Output this shift: No intake/output data recorded.  PE: Gen: NAD Heart: tachy in 110-120s Abd: obese, somewhat tense, especially in her upper abdomen, not tympanitic so suspect this may be from ascites, few BS, diffusely tender but less than yesterday.  No peritonitis  Lab Results:  Recent Labs    08/21/22 0254 08/22/22 0249  WBC 16.3* 13.3*  HGB 11.5* 12.5  HCT 34.5* 39.7  PLT 248 271   BMET Recent Labs    08/21/22 0254 08/22/22 0249  NA 137 136  K 3.6 3.9  CL 101 102  CO2 23 22  GLUCOSE 110* 96  BUN 44* 33*  CREATININE 2.02* 1.53*  CALCIUM 8.1* 7.9*   PT/INR No results for input(s): "LABPROT", "INR" in the last 72 hours. CMP     Component Value Date/Time   NA 136 08/22/2022 0249   K 3.9 08/22/2022 0249   CL 102 08/22/2022 0249   CO2 22 08/22/2022 0249   GLUCOSE 96 08/22/2022 0249   BUN 33 (H) 08/22/2022 0249   CREATININE 1.53 (H) 08/22/2022 0249   CALCIUM 7.9 (L) 08/22/2022 0249   PROT 5.3 (L) 08/22/2022 0249   ALBUMIN 2.3 (L) 08/22/2022 0249   AST 51 (H) 08/22/2022 0249   ALT 35 08/22/2022 0249   ALKPHOS 75 08/22/2022 0249   BILITOT 2.2 (H) 08/22/2022 0249   GFRNONAA 36 (L) 08/22/2022 0249   GFRAA  09/04/2010 1019    >60        The eGFR has been calculated using the MDRD equation. This calculation has not been validated in all  clinical situations. eGFR's persistently <60 mL/min signify possible Chronic Kidney Disease.   Lipase     Component Value Date/Time   LIPASE 55 (H) 08/20/2022 1040       Studies/Results: CT ABDOMEN PELVIS WO CONTRAST  Result Date: 08/20/2022 CLINICAL DATA:  Right lower quadrant pain.  Sepsis. EXAM: CT ABDOMEN AND PELVIS WITHOUT CONTRAST TECHNIQUE: Multidetector CT imaging of the abdomen and pelvis was performed following the standard protocol without IV contrast. RADIATION DOSE REDUCTION: This exam was performed according to the departmental dose-optimization program which includes automated exposure control, adjustment of the mA and/or kV according to patient size and/or use of iterative reconstruction technique. COMPARISON:  08/15/2010 FINDINGS: Lower chest: No acute findings. Hepatobiliary: No mass visualized on this unenhanced exam. Intrahepatic portal venous gas is seen. Gallstones are seen, however there is no evidence of cholecystitis or biliary dilatation. Pancreas: No mass or inflammatory process visualized on this unenhanced exam. Spleen:  Within normal limits in size. Adrenals/Urinary tract: No evidence of urolithiasis or hydronephrosis. Unremarkable unopacified urinary bladder. Stomach/Bowel: Small hiatal hernia is seen. No evidence of bowel obstruction. Mild pneumatosis is seen in the gastric wall, however there is no evidence of gastric wall thickening. Small amount of venous gas is seen within the  mesentery. No evidence of free intraperitoneal air or abscess. Moderate wall thickening is seen involving the transverse colon, with soft tissue stranding seen throughout the omental fat. Moderate ascites is noted. Vascular/Lymphatic: No pathologically enlarged lymph nodes identified. No evidence of abdominal aortic aneurysm. Aortic atherosclerotic calcification incidentally noted. Reproductive: Mildly enlarged uterus is stable with small fibroids better visualized on previous contrast  enhanced exam. Other:  None. Musculoskeletal:  No suspicious bone lesions identified. IMPRESSION: Portal venous and mesenteric venous gas. Mild pneumatosis in the gastric wall, without evidence of gastric wall thickening. Emphysematous gastritis cannot be excluded. Moderate wall thickening involving the transverse colon. Differential diagnosis includes infectious or ischemic colitis, or less likely neoplasm. Moderate ascites and diffuse omental soft tissue stranding. Differential diagnosis includes infectious etiologies and peritoneal carcinomatosis. Cholelithiasis. No radiographic evidence of cholecystitis. Electronically Signed   By: Marlaine Hind M.D.   On: 08/20/2022 12:38    Anti-infectives: Anti-infectives (From admission, onward)    Start     Dose/Rate Route Frequency Ordered Stop   08/20/22 2000  piperacillin-tazobactam (ZOSYN) IVPB 3.375 g        3.375 g 12.5 mL/hr over 240 Minutes Intravenous Every 8 hours 08/20/22 1325 08/25/22 2159   08/20/22 1130  piperacillin-tazobactam (ZOSYN) IVPB 3.375 g        3.375 g 100 mL/hr over 30 Minutes Intravenous  Once 08/20/22 1126 08/20/22 1223        Assessment/Plan Abdominal pain, pneumobilia, gastric pneumatosis, N/V/D -says her pain is better today, no N/V, but is still tachy (although less than yesterday) and doesn't want to come off her left side -WBC down to 13K today -cont abx therapy -awaiting IR for paracentesis which will hopefully give [place] some insight into the etiology of what is going on -no indications currently for surgical intervention.  Will continue to follow. -d/w primary service at the bedside regarding plans of care moving forward   FEN - NPO/IVFs VTE - ok for chemical prophylaxis from our standpoint ID - [email or web address]  DM Hx of seizures HTN IBS Morbid obesity GAD AKI - improved 1.53  I reviewed hospitalist notes, last 24 h vitals and pain scores, last 48 h intake and output, last 24 h labs and trends, and last 24 h  imaging results.   LOS: 2 days    Henreitta Cea , [facility] Surgery 08/22/2022, 8:29 AM Please see Amion for pager number during day hours 7:00am-4:30pm or 7:00am -11:30am on weekends

## 2022-08-23 ENCOUNTER — Inpatient Hospital Stay (HOSPITAL_COMMUNITY): Payer: Medicare HMO

## 2022-08-23 DIAGNOSIS — R112 Nausea with vomiting, unspecified: Secondary | ICD-10-CM | POA: Diagnosis not present

## 2022-08-23 DIAGNOSIS — R197 Diarrhea, unspecified: Secondary | ICD-10-CM | POA: Diagnosis not present

## 2022-08-23 LAB — MAGNESIUM: Magnesium: 1.7 mg/dL (ref 1.7–2.4)

## 2022-08-23 LAB — COMPREHENSIVE METABOLIC PANEL
ALT: 31 U/L (ref 0–44)
AST: 42 U/L — ABNORMAL HIGH (ref 15–41)
Albumin: 1.9 g/dL — ABNORMAL LOW (ref 3.5–5.0)
Alkaline Phosphatase: 63 U/L (ref 38–126)
Anion gap: 9 (ref 5–15)
BUN: 23 mg/dL (ref 8–23)
CO2: 26 mmol/L (ref 22–32)
Calcium: 8.1 mg/dL — ABNORMAL LOW (ref 8.9–10.3)
Chloride: 103 mmol/L (ref 98–111)
Creatinine, Ser: 1.14 mg/dL — ABNORMAL HIGH (ref 0.44–1.00)
GFR, Estimated: 51 mL/min — ABNORMAL LOW (ref >60)
Glucose, Bld: 110 mg/dL — ABNORMAL HIGH (ref 70–99)
Potassium: 4.1 mmol/L (ref 3.5–5.1)
Sodium: 138 mmol/L (ref 135–145)
Total Bilirubin: 1.4 mg/dL — ABNORMAL HIGH (ref 0.3–1.2)
Total Protein: 4.9 g/dL — ABNORMAL LOW (ref 6.5–8.1)

## 2022-08-23 LAB — CBC
HCT: 42.5 % (ref 36.0–46.0)
Hemoglobin: 13.2 g/dL (ref 12.0–15.0)
MCH: 31.4 pg (ref 26.0–34.0)
MCHC: 31.1 g/dL (ref 30.0–36.0)
MCV: 101.2 fL — ABNORMAL HIGH (ref 80.0–100.0)
Platelets: 283 10*3/uL (ref 150–400)
RBC: 4.2 MIL/uL (ref 3.87–5.11)
RDW: 12.3 % (ref 11.5–15.5)
WBC: 11 10*3/uL — ABNORMAL HIGH (ref 4.0–10.5)
nRBC: 0 % (ref 0.0–0.2)

## 2022-08-23 LAB — TRIGLYCERIDES, BODY FLUIDS: Triglycerides, Fluid: 49 mg/dL

## 2022-08-23 LAB — GLUCOSE, CAPILLARY
Glucose-Capillary: 89 mg/dL (ref 70–99)
Glucose-Capillary: 103 mg/dL — ABNORMAL HIGH (ref 70–99)
Glucose-Capillary: 106 mg/dL — ABNORMAL HIGH (ref 70–99)
Glucose-Capillary: 95 mg/dL (ref 70–99)
Glucose-Capillary: 84 mg/dL (ref 70–99)

## 2022-08-23 MED ORDER — SODIUM CHLORIDE 0.9 % IV BOLUS
250.0000 mL | Freq: Once | INTRAVENOUS | Status: AC
  Administered 2022-08-24: 250 mL via INTRAVENOUS

## 2022-08-23 NOTE — Progress Notes (Signed)
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  Chief complaint of progressively worsening abdominal pain that has been ongoing for the past three weeks. In addition to the pain, she reports several accompanying symptoms that have manifested during this period, including a decrease in appetite, unintentional weight loss (the exact quantity of which remains unknown), episodes of diarrhea, and repeated instances of vomiting. The patient has specified that she has not engaged in any recent travel nor has she made any changes to her diet that could potentially explain her symptoms.  In ED : WBC 20.3, LA 2.2  CT > Possible infectious or ischemic colitis indicated by colonic wall thickening and symptomatology. ? Peritoneal carcinomatosis due to the presence of ascites and omental stranding. Mesenteric venous gas and pneumatosis: These findings necessitate investigation for underlying gastrointestinal pathology.   Subjective:  Has mid abdominal pain. No nausea, vomiting or diarrhea.   Assessment and Plan: Abdominal pain with ascites, lactic acidosis,  pneumobilia, gastric pneumatosis, elevated LFTs, leukocytosis elevated lactic acid. Maintain NPO   Continue PPI, IV fluids, Zosyn to cover potential infectious etiologies. 2/13> Paracentesis ~ 5 L of fluid: Albumin, Fluid: 0 Glucose, Fluid: 70 LD (Lactate Dehydrogenase), Fluid: 302 Total protein, fluid: 4.3 Color, Fluid: YELLOW Total Nucleated Cell Count, Fluid: 1,399 Lymphs (Lymphocytes), Fluid: 19 Eos (Eosinophils), Fluid: 0 Appearance, Fluid: HAZY Neutrophil Count, Fluid:  63 Monocyte-Macrophage-Serous Fluid: 18 No organisms seen on gram stain- culture pending Appreciate general surgery - no need for surgery    SIRS  - responded well to IV fluids and albumin.  Acute kidney injury on top of chronic kidney disease stage IIIa  - baseline creatinine level recorded as 1.1 mg/dL -  elevated at 2.2 mg/d in ED - improved to baseline now  Elevated liver enzymes and hyperbilirubinemia: - likely secondary to hypoperfusion.  History of hypertension - current blood pressure medications are being held due to soft blood pressure presentations.  Type 2 diabetes mellitus  - managed with sliding scale insulin.  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  Estimated BMI of 36.61 kg/m, indicating obesity which requires lifestyle modification for long-term management.    Code Status: Full Code Consultants: general surgery Level of Care: Level of care: Stepdown Total time on patient care: 35 min DVT prophylaxis:  SCDs Start: 08/20/22 1402    Objective:   Vitals:   08/22/22 2344 08/23/22 0000 08/23/22 0119 08/23/22 0356  BP:  (!) 93/43 107/65 (!) 128/52  Pulse:  (!) 113 (!) 111 (!) 113  Resp:  17 19 15  $ Temp: 98.3 F (36.8 C)   98.8 F (37.1 C)  TempSrc: Oral   Oral  SpO2:  98% 99% 99%  Weight:      Height:       Filed Weights   08/20/22 1036  Weight: 99.8 kg   Exam: General exam: Appears comfortable  HEENT: oral mucosa moist Respiratory system: Clear to auscultation.  Cardiovascular system: S1 & S2 heard  Gastrointestinal system: Abdomen soft,epigastric and mid abdominal tenderness, moderately distended. Normal bowel sounds   Extremities: No cyanosis, clubbing or edema Psychiatry:  Mood & affect appropriate.  CBC: Recent Labs  Lab 08/20/22 1040 08/21/22 0254 08/22/22 0249 08/23/22 0540  WBC 20.3* 16.3* 13.3* 11.0*  NEUTROABS 17.3*  --   --   --   HGB 14.3 11.5* 12.5 13.2  HCT 43.4 34.5* 39.7 42.5   MCV 96.2 96.9 100.5* 101.2*  PLT 327 248 271 [id number]   Basic Metabolic Panel: Recent Labs  Lab 08/20/22 1040 08/20/22 1325 08/21/22 0254 08/22/22 0249 08/23/22 0540  NA 134*  --  137 136 138  K 3.9  --  3.6 3.9 4.1  CL 99  --  101 102 103  CO2 24  --  23 22 26  $ GLUCOSE 170*  --  110* 96 110*  BUN 48*  --  44* 33* 23  CREATININE 2.28*  --  2.02* 1.53* 1.14*  CALCIUM 8.5*  --  8.1* 7.9* 8.1*  MG  --  1.9  --   --  1.7  PHOS  --  4.1  --   --   --    GFR: Estimated Creatinine Clearance: 52.2 mL/min (A) (by C-G formula based on SCr of 1.14 mg/dL (H)).  Scheduled Meds:  Chlorhexidine Gluconate Cloth  6 each Topical Daily   insulin aspart  0-15 Units Subcutaneous Q4H   lip balm   Topical BID   pantoprazole (PROTONIX) IV  40 mg Intravenous Q12H   Continuous Infusions:  sodium chloride Stopped (08/23/22 0614)   dextrose 5% lactated ringers 20 mL/hr at 08/23/22 0622   lactated ringers 80 mL/hr at 08/23/22 0622   methocarbamol (ROBAXIN) IV     ondansetron (ZOFRAN) IV     piperacillin-tazobactam (ZOSYN)  IV 12.5 mL/hr at 08/23/22 [id number]   Imaging and lab data was personally reviewed US Paracentesis  Result Date: 08/22/2022 INDICATION: Abdominal distention. Ascites. Request for diagnostic and therapeutic paracentesis. EXAM: ULTRASOUND GUIDED LEFT LOWER QUADRANT PARACENTESIS MEDICATIONS: 1% plain lidocaine, 10 mL COMPLICATIONS: None immediate. PROCEDURE: Informed written consent was obtained from the patient after a discussion of the risks, benefits and alternatives to treatment. A timeout was performed prior to the initiation of the procedure. Initial ultrasound scanning demonstrates a large amount of ascites within the left lower abdominal quadrant. The left lower abdomen was prepped and draped in the usual sterile fashion. 1% lidocaine was used for local anesthesia. Following this, a 19 gauge, 10-cm, Yueh catheter was introduced. An ultrasound image was saved for documentation purposes.  The paracentesis was performed. The catheter was removed and a dressing was applied. The patient tolerated the procedure well without immediate post procedural complication. FINDINGS: A total of approximately 5.5 L of clear yellow fluid was removed. Samples were sent to the laboratory as requested by the clinical team. IMPRESSION: Successful ultrasound-guided paracentesis yielding 5.5 liters of peritoneal fluid. Read by: Ascencion Dike PA-C Electronically Signed   By: Corrie Mckusick D.O.   On: 08/22/2022 12:27    LOS: 3 days   Author: Debbe Odea  08/23/2022 7:41 AM  To contact [facility]>   Check the care team in [facility] and look for the attending/consulting [place] provider listed  Log into [email or web address] and use [place]'s universal password   Go to> "[facility]"  and find provider  If you still have difficulty reaching the provider, please page the [facility] (Director on Call) for the Hospitalists listed on amion

## 2022-08-23 NOTE — Plan of Care (Signed)
Remains NPO, encouraging activity

## 2022-08-23 NOTE — Progress Notes (Signed)
Subjective: Patient continues to lay on her left side and doesn't want to move to her back or right side.  Had 5.5L of yellow serous fluid removed yesterday with paracentesis.  Cytology pending.  Increasing nausea today.   Objective: Vital signs in last 24 hours: Temp:  [98.3 F (36.8 C)-99.2 F (37.3 C)] 98.8 F (37.1 C) (02/14 0356) Pulse Rate:  [107-127] 113 (02/14 0356) Resp:  [11-29] 15 (02/14 0356) BP: (93-145)/(43-86) 128/52 (02/14 0356) SpO2:  [73 %-100 %] 99 % (02/14 0356) Last BM Date : 08/19/22  Intake/Output from previous day: 02/13 0701 - 02/14 0700 In: 1720.6 [I.V.:1619.1; IV Piggyback:101.4] Out: 875 [Urine:875] Intake/Output this shift: No intake/output data recorded.  PE: Gen: NAD Heart: tachy in 130s still today Abd: obese, tense still in upper abdomen, few BS, diffusely tender but no peritonitis  Lab Results:  Recent Labs    08/22/22 0249 08/23/22 0540  WBC 13.3* 11.0*  HGB 12.5 13.2  HCT 39.7 42.5  PLT 271 283   BMET Recent Labs    08/22/22 0249 08/23/22 0540  NA 136 138  K 3.9 4.1  CL 102 103  CO2 22 26  GLUCOSE 96 110*  BUN 33* 23  CREATININE 1.53* 1.14*  CALCIUM 7.9* 8.1*   PT/INR No results for input(s): "LABPROT", "INR" in the last 72 hours. CMP     Component Value Date/Time   NA 138 08/23/2022 0540   K 4.1 08/23/2022 0540   CL 103 08/23/2022 0540   CO2 26 08/23/2022 0540   GLUCOSE 110 (H) 08/23/2022 0540   BUN 23 08/23/2022 0540   CREATININE 1.14 (H) 08/23/2022 0540   CALCIUM 8.1 (L) 08/23/2022 0540   PROT 4.9 (L) 08/23/2022 0540   ALBUMIN 1.9 (L) 08/23/2022 0540   AST 42 (H) 08/23/2022 0540   ALT 31 08/23/2022 0540   ALKPHOS 63 08/23/2022 0540   BILITOT 1.4 (H) 08/23/2022 0540   GFRNONAA 51 (L) 08/23/2022 0540   GFRAA  09/04/2010 1019    >60        The eGFR has been calculated using the MDRD equation. This calculation has not been validated in all clinical situations. eGFR's persistently <60 mL/min  signify possible Chronic Kidney Disease.   Lipase     Component Value Date/Time   LIPASE 55 (H) 08/20/2022 1040       Studies/Results: US Paracentesis  Result Date: 08/22/2022 INDICATION: Abdominal distention. Ascites. Request for diagnostic and therapeutic paracentesis. EXAM: ULTRASOUND GUIDED LEFT LOWER QUADRANT PARACENTESIS MEDICATIONS: 1% plain lidocaine, 10 mL COMPLICATIONS: None immediate. PROCEDURE: Informed written consent was obtained from the patient after a discussion of the risks, benefits and alternatives to treatment. A timeout was performed prior to the initiation of the procedure. Initial ultrasound scanning demonstrates a large amount of ascites within the left lower abdominal quadrant. The left lower abdomen was prepped and draped in the usual sterile fashion. 1% lidocaine was used for local anesthesia. Following this, a 19 gauge, 10-cm, Yueh catheter was introduced. An ultrasound image was saved for documentation purposes. The paracentesis was performed. The catheter was removed and a dressing was applied. The patient tolerated the procedure well without immediate post procedural complication. FINDINGS: A total of approximately 5.5 L of clear yellow fluid was removed. Samples were sent to the laboratory as requested by the clinical team. IMPRESSION: Successful ultrasound-guided paracentesis yielding 5.5 liters of peritoneal fluid. Read by: Ascencion Dike PA-C Electronically Signed   By: Corrie Mckusick D.O.  On: 08/22/2022 12:27    Anti-infectives: Anti-infectives (From admission, onward)    Start     Dose/Rate Route Frequency Ordered Stop   08/20/22 2000  piperacillin-tazobactam (ZOSYN) IVPB 3.375 g        3.375 g 12.5 mL/hr over 240 Minutes Intravenous Every 8 hours 08/20/22 1325 08/25/22 2159   08/20/22 1130  piperacillin-tazobactam (ZOSYN) IVPB 3.375 g        3.375 g 100 mL/hr over 30 Minutes Intravenous  Once 08/20/22 1126 08/20/22 1223         Assessment/Plan Abdominal pain, pneumobilia, gastric pneumatosis, N/V/D -says her pain is still present today despite having 5.5L removed from abdomen yesterday.  Still won't roll off of her left side.   -increasing nausea today.  Will check plain film this am.  -WBC down to 11K today.  Overall WBC downtrending/AF which is suggestive of an improving process; however, she continues to have distention, pain, tachycardia, and today some hypotension.  Will await film and continue to closely monitor and await cytology as stated below. -cont abx therapy -awaiting cytology from paracentesis -no indications currently for surgical intervention.  Will continue to follow.   FEN - NPO/IVFs VTE - ok for chemical prophylaxis from our standpoint ID - zosyn  DM Hx of seizures HTN IBS Morbid obesity GAD AKI - improved 1.53  I reviewed Consultant IR notes, hospitalist notes, last 24 h vitals and pain scores, last 48 h intake and output, last 24 h labs and trends, and last 24 h imaging results.   LOS: 3 days    Henreitta Cea , [facility] 08/23/2022, 8:23 AM Please see Amion for pager number during day hours 7:00am-4:30pm or 7:00am -11:30am on weekends

## 2022-08-24 DIAGNOSIS — R112 Nausea with vomiting, unspecified: Secondary | ICD-10-CM | POA: Diagnosis not present

## 2022-08-24 DIAGNOSIS — R197 Diarrhea, unspecified: Secondary | ICD-10-CM | POA: Diagnosis not present

## 2022-08-24 LAB — GLUCOSE, CAPILLARY
Glucose-Capillary: 102 mg/dL — ABNORMAL HIGH (ref 70–99)
Glucose-Capillary: 85 mg/dL (ref 70–99)
Glucose-Capillary: 94 mg/dL (ref 70–99)
Glucose-Capillary: 99 mg/dL (ref 70–99)

## 2022-08-24 LAB — CBC
HCT: 43.9 % (ref 36.0–46.0)
Hemoglobin: 13.4 g/dL (ref 12.0–15.0)
MCH: 31.1 pg (ref 26.0–34.0)
MCHC: 30.5 g/dL (ref 30.0–36.0)
MCV: 101.9 fL — ABNORMAL HIGH (ref 80.0–100.0)
Platelets: 299 K/uL (ref 150–400)
RBC: 4.31 MIL/uL (ref 3.87–5.11)
RDW: 12.3 % (ref 11.5–15.5)
WBC: 11.8 K/uL — ABNORMAL HIGH (ref 4.0–10.5)
nRBC: 0 % (ref 0.0–0.2)

## 2022-08-24 LAB — COMPREHENSIVE METABOLIC PANEL
ALT: 25 U/L (ref 0–44)
AST: 35 U/L (ref 15–41)
Albumin: 2.2 g/dL — ABNORMAL LOW (ref 3.5–5.0)
Alkaline Phosphatase: 56 U/L (ref 38–126)
Anion gap: 11 (ref 5–15)
BUN: 19 mg/dL (ref 8–23)
CO2: 26 mmol/L (ref 22–32)
Calcium: 7.9 mg/dL — ABNORMAL LOW (ref 8.9–10.3)
Chloride: 102 mmol/L (ref 98–111)
Creatinine, Ser: 1.21 mg/dL — ABNORMAL HIGH (ref 0.44–1.00)
GFR, Estimated: 48 mL/min — ABNORMAL LOW (ref >60)
Glucose, Bld: 99 mg/dL (ref 70–99)
Potassium: 4.1 mmol/L (ref 3.5–5.1)
Sodium: 139 mmol/L (ref 135–145)
Total Bilirubin: 1.9 mg/dL — ABNORMAL HIGH (ref 0.3–1.2)
Total Protein: 5.1 g/dL — ABNORMAL LOW (ref 6.5–8.1)

## 2022-08-24 MED ORDER — ALBUMIN HUMAN 25 % IV SOLN
25.0000 g | Freq: Once | INTRAVENOUS | Status: AC
  Administered 2022-08-24: 25 g via INTRAVENOUS
  Filled 2022-08-24: qty 100

## 2022-08-24 MED ORDER — HYDROMORPHONE HCL 1 MG/ML IJ SOLN
1.0000 mg | INTRAMUSCULAR | Status: DC | PRN
  Administered 2022-08-24 – 2022-09-02 (×20): 1 mg via INTRAVENOUS
  Filled 2022-08-24 (×24): qty 1

## 2022-08-24 MED ORDER — HYDROCODONE-ACETAMINOPHEN 5-325 MG PO TABS
1.0000 | ORAL_TABLET | Freq: Four times a day (QID) | ORAL | Status: DC | PRN
  Administered 2022-08-25 – 2022-08-29 (×12): 1 via ORAL
  Filled 2022-08-24 (×12): qty 1

## 2022-08-24 MED ORDER — SIMETHICONE 40 MG/0.6ML PO SUSP
80.0000 mg | Freq: Four times a day (QID) | ORAL | Status: DC
  Administered 2022-08-25 – 2022-09-05 (×42): 80 mg via ORAL
  Filled 2022-08-24 (×49): qty 1.2

## 2022-08-24 MED ORDER — SIMETHICONE 80 MG PO CHEW
80.0000 mg | CHEWABLE_TABLET | Freq: Four times a day (QID) | ORAL | Status: DC
  Administered 2022-08-24: 80 mg via ORAL
  Filled 2022-08-24 (×2): qty 1

## 2022-08-24 MED ORDER — SIMETHICONE 40 MG/0.6ML PO SUSP
80.0000 mg | Freq: Four times a day (QID) | ORAL | Status: DC | PRN
  Administered 2022-08-25: 80 mg via ORAL
  Filled 2022-08-24 (×2): qty 1.2

## 2022-08-24 NOTE — Progress Notes (Signed)
[facility] Progress Note  Patient: [name]     [id number]  DOA: 08/20/2022   PCP: London Pepper, MD       Brief hospital course: 72 y/o with Class II obesity and Type 2 Diabetes Mellitus,anxiety, depression, a panic attacks, CVA that occurred in 2013, hypertension, previous diagnosis of cholelithiasis, as well as a persistent irritable bowel syndrome (IBS) & a seizure disorder.  Chief complaint of progressively worsening abdominal pain that has been ongoing for the past three weeks. In addition to the pain, she reports several accompanying symptoms that have manifested during this period, including a decrease in appetite, unintentional weight loss (the exact quantity of which remains unknown), episodes of diarrhea, and repeated instances of vomiting. The patient has specified that she has not engaged in any recent travel nor has she made any changes to her diet that could potentially explain her symptoms.  In ED : WBC 20.3, LA 2.2  CT > Possible infectious or ischemic colitis indicated by colonic wall thickening and symptomatology. ? Peritoneal carcinomatosis due to the presence of ascites and omental stranding. Mesenteric venous gas and pneumatosis: These findings necessitate investigation for underlying gastrointestinal pathology.   Subjective:   Drank clear liquids which felt great but now has an ache in her abdomen. It feels better after she belches. No heart burn. No other complaints.   Assessment and Plan:  Infectious colitis vs ischemic colitis Abdominal pain with ascites, lactic acidosis,  pneumobilia, gastric pneumatosis, elevated LFTs, leukocytosis elevated lactic acid.   - cont Zosyn to cover potential infectious etiologies. 2/13> Paracentesis ~ 5 L of fluid: Total Nucleated Cell Count, Fluid: 1,399 Lymphs (Lymphocytes), Fluid: 19 Eos (Eosinophils), Fluid: 0 Neutrophil Count, Fluid: 63 Monocyte-Macrophage-Serous Fluid: 18 No organisms seen on gram stain-  culture pending - Appreciate general surgery - no need for surgery - cont PPI - has started clears- I will stop IVF - add Mylicon for gaseous distention- have asked her to avoid carbonated beverages, start getting OOB to chair & ambulate if possible - reduce use of IV Dilaudid if possible now - add Hydrocodone as an alternative     SIRS  History of hypertension - current blood pressure medications are being held as BP has not been elevated - still tachycardic in reflex to holding lopressor  - mild leukocytosis still present  Macrocytosis  -MCV 101.9- check folate and B12 levels  Acute kidney injury on top of chronic kidney disease stage IIIa  - baseline creatinine level recorded as 1.1 mg/dL -  elevated at 2.2 mg/d in ED - improved to baseline now  Elevated liver enzymes and hyperbilirubinemia: - likely secondary to hypoperfusion.  Type 2 diabetes mellitus  - managed with sliding scale insulin.  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  Estimated BMI of 36.61 kg/m, indicating obesity which requires lifestyle modification for long-term management.    Code Status: Full Code Consultants: general surgery Level of Care: Level of care: Med-Surg Total time on patient care: 35 min DVT prophylaxis:  SCDs Start: 08/20/22 1402    Objective:   Vitals:   08/24/22 1300 08/24/22 1400 08/24/22 1603 08/24/22 1619  BP:   103/63   Pulse: (!) 123     Resp: 16 14 (!) 24   Temp:    97.8 F (36.6 C)  TempSrc:    Oral  SpO2: 100%  93%   Weight:      Height:       [employer]  08/20/22 1036  Weight: 99.8 kg   Exam: General exam: Appears comfortable  HEENT: oral mucosa moist Respiratory system: Clear to auscultation.  Cardiovascular system: S1 & S2 heard  Gastrointestinal system: Abdomen soft, significantly distended and tight, mildly tender. Normal bowel sounds   Extremities: No cyanosis, clubbing or edema Psychiatry:  Mood & affect  appropriate.      CBC: Recent Labs  Lab 08/20/22 1040 08/21/22 0254 08/22/22 0249 08/23/22 0540 08/24/22 0308  WBC 20.3* 16.3* 13.3* 11.0* 11.8*  NEUTROABS 17.3*  --   --   --   --   HGB 14.3 11.5* 12.5 13.2 13.4  HCT 43.4 34.5* 39.7 42.5 43.9  MCV 96.2 96.9 100.5* 101.2* 101.9*  PLT 327 248 271 283 [id number]   Basic Metabolic Panel: Recent Labs  Lab 08/20/22 1040 08/20/22 1325 08/21/22 0254 08/22/22 0249 08/23/22 0540 08/24/22 0308  NA 134*  --  137 136 138 139  K 3.9  --  3.6 3.9 4.1 4.1  CL 99  --  101 102 103 102  CO2 24  --  23 22 26 26  $ GLUCOSE 170*  --  110* 96 110* 99  BUN 48*  --  44* 33* 23 19  CREATININE 2.28*  --  2.02* 1.53* 1.14* 1.21*  CALCIUM 8.5*  --  8.1* 7.9* 8.1* 7.9*  MG  --  1.9  --   --  1.7  --   PHOS  --  4.1  --   --   --   --    GFR: Estimated Creatinine Clearance: 49.2 mL/min (A) (by C-G formula based on SCr of 1.21 mg/dL (H)).  Scheduled Meds:  Chlorhexidine Gluconate Cloth  6 each Topical Daily   insulin aspart  0-15 Units Subcutaneous Q4H   lip balm   Topical BID   pantoprazole (PROTONIX) IV  40 mg Intravenous Q12H   simethicone  80 mg Oral QID   Continuous Infusions:  sodium chloride 10 mL/hr at 08/24/22 0400   ondansetron (ZOFRAN) IV     piperacillin-tazobactam (ZOSYN)  IV 3.375 g (08/24/22 1304)   Imaging and lab data was personally reviewed DG Abd Portable 1V  Result Date: 08/23/2022 CLINICAL DATA:  Abdominal pain. EXAM: PORTABLE ABDOMEN - 1 VIEW COMPARISON:  09/04/2010. FINDINGS: Gas-filled bowel loops without dilation. No free air. The right abdominal wall is excluded from the field of view. No radiopaque calculi. Degenerative changes of the spine. IMPRESSION: Gas-filled bowel loops without dilation. Electronically Signed   By: Emmit Alexanders M.D.   On: 08/23/2022 11:01    LOS: 4 days   Author: Debbe Odea  08/24/2022 4:25 PM  To contact [facility]>   Check the care team in [facility] and look for the  attending/consulting [place] provider listed  Log into [email or web address] and use [place]'s universal password   Go to> "[facility]"  and find provider  If you still have difficulty reaching the provider, please page the [facility] (Director on Call) for the Hospitalists listed on amion

## 2022-08-24 NOTE — Progress Notes (Signed)
       CROSS COVER NOTE  NAME: [name] MRN: [id number] DOB : 1950-08-27    Date of Service   08/24/2022     HPI/Events of Note   Bedside RN concerned with low urine output.  BP 104/53 (68).   + ascites and BUE/ BLE edema +1. Albumin 2.2, creatinine 1.21 (baseline).  Patient on clear liquids, currently not on IV fluids.  Unsure if intake/output data is accurate, currently stating that patient is +13.1 L since admission?  Although patient may benefit from gentle diuresing, current BP soft.  Will avoid IV fluid bolus unless patient becomes hypotensive.  IV albumin ordered.    Interventions/ Plan   IV albumin       Raenette Rover, DNP, [place]

## 2022-08-24 NOTE — Progress Notes (Signed)
Subjective: Able to lay on her back today.  Says her abdominal pain is improved today.  Thirsty and hungry.  Nausea improved.   Objective: Vital signs in last 24 hours: Temp:  [98 F (36.7 C)-98.4 F (36.9 C)] 98 F (36.7 C) (02/15 0530) Pulse Rate:  [110-129] 114 (02/15 0700) Resp:  [11-23] 17 (02/15 0700) BP: (111-121)/(47-62) 112/58 (02/15 0530) SpO2:  [95 %-100 %] 100 % (02/15 0700) Last BM Date : 08/19/22  Intake/Output from previous day: 02/14 0701 - 02/15 0700 In: 2318 [I.V.:1922.2; IV Piggyback:395.9] Out: 350 [Urine:350] Intake/Output this shift: Total I/O In: -  Out: 20 [Urine:50]  PE: Gen: NAD Heart: tachy but improved in 110s today Abd: obese, softer today, still with some diffuse tenderness, but no peritonitis or guarding  Lab Results:  Recent Labs    08/23/22 0540 08/24/22 0308  WBC 11.0* 11.8*  HGB 13.2 13.4  HCT 42.5 43.9  PLT 283 299   BMET Recent Labs    08/23/22 0540 08/24/22 0308  NA 138 139  K 4.1 4.1  CL 103 102  CO2 26 26  GLUCOSE 110* 99  BUN 23 19  CREATININE 1.14* 1.21*  CALCIUM 8.1* 7.9*   PT/INR No results for input(s): "LABPROT", "INR" in the last 72 hours. CMP     Component Value Date/Time   NA 139 08/24/2022 0308   K 4.1 08/24/2022 0308   CL 102 08/24/2022 0308   CO2 26 08/24/2022 0308   GLUCOSE 99 08/24/2022 0308   BUN 19 08/24/2022 0308   CREATININE 1.21 (H) 08/24/2022 0308   CALCIUM 7.9 (L) 08/24/2022 0308   PROT 5.1 (L) 08/24/2022 0308   ALBUMIN 2.2 (L) 08/24/2022 0308   AST 35 08/24/2022 0308   ALT 25 08/24/2022 0308   ALKPHOS 56 08/24/2022 0308   BILITOT 1.9 (H) 08/24/2022 0308   GFRNONAA 48 (L) 08/24/2022 0308   GFRAA  09/04/2010 1019    >60        The eGFR has been calculated using the MDRD equation. This calculation has not been validated in all clinical situations. eGFR's persistently <60 mL/min signify possible Chronic Kidney Disease.   Lipase     Component Value Date/Time    LIPASE 55 (H) 08/20/2022 1040       Studies/Results: DG Abd Portable 1V  Result Date: 08/23/2022 CLINICAL DATA:  Abdominal pain. EXAM: PORTABLE ABDOMEN - 1 VIEW COMPARISON:  09/04/2010. FINDINGS: Gas-filled bowel loops without dilation. No free air. The right abdominal wall is excluded from the field of view. No radiopaque calculi. Degenerative changes of the spine. IMPRESSION: Gas-filled bowel loops without dilation. Electronically Signed   By: Emmit Alexanders M.D.   On: 08/23/2022 11:01   US Paracentesis  Result Date: 08/22/2022 INDICATION: Abdominal distention. Ascites. Request for diagnostic and therapeutic paracentesis. EXAM: ULTRASOUND GUIDED LEFT LOWER QUADRANT PARACENTESIS MEDICATIONS: 1% plain lidocaine, 10 mL COMPLICATIONS: None immediate. PROCEDURE: Informed written consent was obtained from the patient after a discussion of the risks, benefits and alternatives to treatment. A timeout was performed prior to the initiation of the procedure. Initial ultrasound scanning demonstrates a large amount of ascites within the left lower abdominal quadrant. The left lower abdomen was prepped and draped in the usual sterile fashion. 1% lidocaine was used for local anesthesia. Following this, a 19 gauge, 10-cm, Yueh catheter was introduced. An ultrasound image was saved for documentation purposes. The paracentesis was performed. The catheter was removed and a dressing was applied.  The patient tolerated the procedure well without immediate post procedural complication. FINDINGS: A total of approximately 5.5 L of clear yellow fluid was removed. Samples were sent to the laboratory as requested by the clinical team. IMPRESSION: Successful ultrasound-guided paracentesis yielding 5.5 liters of peritoneal fluid. Read by: Ascencion Dike PA-C Electronically Signed   By: Corrie Mckusick D.O.   On: 08/22/2022 12:27    Anti-infectives: Anti-infectives (From admission, onward)    Start     Dose/Rate Route Frequency  Ordered Stop   08/20/22 2000  piperacillin-tazobactam (ZOSYN) IVPB 3.375 g        3.375 g 12.5 mL/hr over 240 Minutes Intravenous Every 8 hours 08/20/22 1325 08/25/22 2159   08/20/22 1130  piperacillin-tazobactam (ZOSYN) IVPB 3.375 g        3.375 g 100 mL/hr over 30 Minutes Intravenous  Once 08/20/22 1126 08/20/22 1223        Assessment/Plan Abdominal pain, pneumobilia, gastric pneumatosis, N/V/D -says her pain is still present but improved today  -plain film reassuring yesterday with no free air or bowel abnormalities -WBC down to 11K today.  Overall WBC downtrending/AF which is suggestive of an improving process. -cont abx therapy -awaiting cytology from paracentesis -no indications currently for surgical intervention.  Will continue to follow. -will allow CLD and see how she does   FEN - CLD/IVFs VTE - ok for chemical prophylaxis from our standpoint ID - zosyn  DM Hx of seizures HTN IBS Morbid obesity GAD AKI - improved 1.53  I reviewed Consultant IR notes, hospitalist notes, last 24 h vitals and pain scores, last 48 h intake and output, last 24 h labs and trends, and last 24 h imaging results.   LOS: 4 days    Henreitta Cea , [facility] 08/24/2022, 8:44 AM Please see Amion for pager number during day hours 7:00am-4:30pm or 7:00am -11:30am on weekends

## 2022-08-24 NOTE — Progress Notes (Incomplete)
       CROSS COVER NOTE  NAME: Sunny Aguon MRN: [id number] DOB : [birth year]    Date of Service   08/24/2022     HPI/Events of Note   Bedside RN concerned with low urine output.  BP 104/53 (68).   Positive ascites, BUE and BLE edema +1.  Currently not on IV fluids.  Albumin 2.2, creatinine 1.21 (baseline).      Interventions/ Plan   X       Raenette Rover, DNP, [place]

## 2022-08-25 DIAGNOSIS — R197 Diarrhea, unspecified: Secondary | ICD-10-CM | POA: Diagnosis not present

## 2022-08-25 DIAGNOSIS — R112 Nausea with vomiting, unspecified: Secondary | ICD-10-CM | POA: Diagnosis not present

## 2022-08-25 LAB — COMPREHENSIVE METABOLIC PANEL
ALT: 20 U/L (ref 0–44)
AST: 30 U/L (ref 15–41)
Albumin: 2.3 g/dL — ABNORMAL LOW (ref 3.5–5.0)
Alkaline Phosphatase: 48 U/L (ref 38–126)
Anion gap: 10 (ref 5–15)
BUN: 16 mg/dL (ref 8–23)
CO2: 27 mmol/L (ref 22–32)
Calcium: 8 mg/dL — ABNORMAL LOW (ref 8.9–10.3)
Chloride: 100 mmol/L (ref 98–111)
Creatinine, Ser: 1.16 mg/dL — ABNORMAL HIGH (ref 0.44–1.00)
GFR, Estimated: 50 mL/min — ABNORMAL LOW (ref >60)
Glucose, Bld: 102 mg/dL — ABNORMAL HIGH (ref 70–99)
Potassium: 4 mmol/L (ref 3.5–5.1)
Sodium: 137 mmol/L (ref 135–145)
Total Bilirubin: 1.5 mg/dL — ABNORMAL HIGH (ref 0.3–1.2)
Total Protein: 5.1 g/dL — ABNORMAL LOW (ref 6.5–8.1)

## 2022-08-25 LAB — CBC
HCT: 40.1 % (ref 36.0–46.0)
Hemoglobin: 12.8 g/dL (ref 12.0–15.0)
MCH: 32 pg (ref 26.0–34.0)
MCHC: 31.9 g/dL (ref 30.0–36.0)
MCV: 100.3 fL — ABNORMAL HIGH (ref 80.0–100.0)
Platelets: 280 10*3/uL (ref 150–400)
RBC: 4 MIL/uL (ref 3.87–5.11)
RDW: 12.2 % (ref 11.5–15.5)
WBC: 11.8 10*3/uL — ABNORMAL HIGH (ref 4.0–10.5)
nRBC: 0 % (ref 0.0–0.2)

## 2022-08-25 LAB — TOTAL BILIRUBIN, BODY FLUID: Total bilirubin, fluid: 1.2 mg/dL

## 2022-08-25 LAB — CULTURE, BLOOD (ROUTINE X 2)
Culture: NO GROWTH
Culture: NO GROWTH
Special Requests: ADEQUATE

## 2022-08-25 LAB — GLUCOSE, CAPILLARY
Glucose-Capillary: 101 mg/dL — ABNORMAL HIGH (ref 70–99)
Glucose-Capillary: 103 mg/dL — ABNORMAL HIGH (ref 70–99)
Glucose-Capillary: 110 mg/dL — ABNORMAL HIGH (ref 70–99)
Glucose-Capillary: 125 mg/dL — ABNORMAL HIGH (ref 70–99)
Glucose-Capillary: 133 mg/dL — ABNORMAL HIGH (ref 70–99)
Glucose-Capillary: 204 mg/dL — ABNORMAL HIGH (ref 70–99)
Glucose-Capillary: 99 mg/dL (ref 70–99)

## 2022-08-25 LAB — VITAMIN B12: Vitamin B-12: 211 pg/mL (ref 180–914)

## 2022-08-25 LAB — FOLATE: Folate: 7.2 ng/mL (ref >5.9)

## 2022-08-25 MED ORDER — CYANOCOBALAMIN 1000 MCG/ML IJ SOLN
1000.0000 ug | Freq: Once | INTRAMUSCULAR | Status: AC
  Administered 2022-08-25: 1000 ug via SUBCUTANEOUS
  Filled 2022-08-25: qty 1

## 2022-08-25 MED ORDER — BOOST / RESOURCE BREEZE PO LIQD CUSTOM
1.0000 | Freq: Three times a day (TID) | ORAL | Status: DC
  Administered 2022-08-25 (×3): 1 via ORAL

## 2022-08-25 MED ORDER — METOPROLOL TARTRATE 25 MG PO TABS
25.0000 mg | ORAL_TABLET | Freq: Two times a day (BID) | ORAL | Status: DC
  Administered 2022-08-25 (×2): 25 mg via ORAL
  Filled 2022-08-25 (×2): qty 1

## 2022-08-25 MED ORDER — ALBUMIN HUMAN 25 % IV SOLN
25.0000 g | Freq: Three times a day (TID) | INTRAVENOUS | Status: AC
  Administered 2022-08-25 – 2022-08-31 (×20): 25 g via INTRAVENOUS
  Filled 2022-08-25 (×20): qty 100

## 2022-08-25 NOTE — Plan of Care (Signed)
  Problem: Coping: Goal: Ability to adjust to condition or change in health will improve Outcome: Progressing   Problem: Fluid Volume: Goal: Ability to maintain a balanced intake and output will improve Outcome: Progressing   Problem: Education: Goal: Knowledge of General Education information will improve Description: Including pain rating scale, medication(s)/side effects and non-pharmacologic comfort measures Outcome: Progressing   Problem: Health Behavior/Discharge Planning: Goal: Ability to manage health-related needs will improve Outcome: Progressing   Problem: Clinical Measurements: Goal: Ability to maintain clinical measurements within normal limits will improve Outcome: Progressing   Problem: Activity: Goal: Risk for activity intolerance will decrease Outcome: Progressing   Problem: Nutrition: Goal: Adequate nutrition will be maintained Outcome: Progressing

## 2022-08-25 NOTE — Progress Notes (Signed)
Subjective: CC: Reports stable upper abdominal pain. Tolerating cld without n/v. She is not sure about flatus but reports no bm yesterday.   Afebrile. Tachycardic in 110's. Appears she has been tachycardic over the last several days. Now out of the unit. No systolic hypotension. WBC stable at 11.8. UOP 0.2 ml/kg/hr which is actually improved from the day prior where she was 0.77m/kg/hr. Cr stable at 1.16 from 1.21. Getting albumin q8hrs. Now off maintenance IVF.   Objective: Vital signs in last 24 hours: Temp:  [97.8 F (36.6 C)-98.3 F (36.8 C)] 97.9 F (36.6 C) (02/16 0621) Pulse Rate:  [96-125] 116 (02/16 0621) Resp:  [12-24] 17 (02/16 0621) BP: (103-149)/(53-97) 134/73 (02/16 0621) SpO2:  [88 %-100 %] 100 % (02/16 0621) Weight:  [108.7 kg] 108.7 kg (02/16 0221) Last BM Date : 08/19/22  Intake/Output from previous day: 02/15 0701 - 02/16 0700 In: 1369.1 [I.V.:1117.9; IV Piggyback:251.2] Out: 495 [Urine:495] Intake/Output this shift: No intake/output data recorded.  PE: Gen:  Alert, NAD, pleasant Card:  Tachycardic  Abd: Obese, soft, some upper abdominal ttp but no rigidity or guarding. +BS.   Lab Results:  Recent Labs    08/24/22 0308 08/25/22 0413  WBC 11.8* 11.8*  HGB 13.4 12.8  HCT 43.9 40.1  PLT 299 280   BMET Recent Labs    08/24/22 0308 08/25/22 0413  NA 139 137  K 4.1 4.0  CL 102 100  CO2 26 27  GLUCOSE 99 102*  BUN 19 16  CREATININE 1.21* 1.16*  CALCIUM 7.9* 8.0*   PT/INR No results for input(s): "LABPROT", "INR" in the last 72 hours. CMP     Component Value Date/Time   NA 137 08/25/2022 0413   K 4.0 08/25/2022 0413   CL 100 08/25/2022 0413   CO2 27 08/25/2022 0413   GLUCOSE 102 (H) 08/25/2022 0413   BUN 16 08/25/2022 0413   CREATININE 1.16 (H) 08/25/2022 0413   CALCIUM 8.0 (L) 08/25/2022 0413   PROT 5.1 (L) 08/25/2022 0413   ALBUMIN 2.3 (L) 08/25/2022 0413   AST 30 08/25/2022 0413   ALT 20 08/25/2022 0413   ALKPHOS 48  08/25/2022 0413   BILITOT 1.5 (H) 08/25/2022 0413   GFRNONAA 50 (L) 08/25/2022 0413   GFRAA  09/04/2010 1019    >60        The eGFR has been calculated using the MDRD equation. This calculation has not been validated in all clinical situations. eGFR's persistently <60 mL/min signify possible Chronic Kidney Disease.   Lipase     Component Value Date/Time   LIPASE 55 (H) 08/20/2022 1040    Studies/Results: DG Abd Portable 1V  Result Date: 08/23/2022 CLINICAL DATA:  Abdominal pain. EXAM: PORTABLE ABDOMEN - 1 VIEW COMPARISON:  09/04/2010. FINDINGS: Gas-filled bowel loops without dilation. No free air. The right abdominal wall is excluded from the field of view. No radiopaque calculi. Degenerative changes of the spine. IMPRESSION: Gas-filled bowel loops without dilation. Electronically Signed   By: WEmmit AlexandersM.D.   On: 08/23/2022 11:01    Anti-infectives: Anti-infectives (From admission, onward)    Start     Dose/Rate Route Frequency Ordered Stop   08/20/22 2000  piperacillin-tazobactam (ZOSYN) IVPB 3.375 g        3.375 g 12.5 mL/hr over 240 Minutes Intravenous Every 8 hours 08/20/22 1325 08/25/22 2159   08/20/22 1130  piperacillin-tazobactam (ZOSYN) IVPB 3.375 g        3.375 g 100 mL/hr  over 30 Minutes Intravenous  Once 08/20/22 1126 08/20/22 1223        Assessment/Plan Abdominal pain, pneumobilia, gastric pneumatosis, N/V/D - Reports her pain is still present but stable today. No peritonitis on exam. Stable tachycardia. Afebrile. No systolic hypotension. WBC stable. No indication for emergency surgery currently.  - Cont abx therapy and BID PPI - Awaiting cytology from paracentesis - Will continue to follow.   FEN - Allow CLD, would not advance past this. IVF/Albumin per primary  VTE - SCDs, ok for chemical prophylaxis from our standpoint ID - zosyn 2/11 >>  Foley - per primary   DM Hx of seizures HTN IBS Morbid obesity GAD AKI - improved 1.16  I reviewed  nursing notes, hospitalist notes, last 24 h vitals and pain scores, last 48 h intake and output, last 24 h labs and trends, and last 24 h imaging results.   LOS: 5 days    Jillyn Ledger , [facility] Surgery 08/25/2022, 8:34 AM Please see Amion for pager number during day hours 7:00am-4:30pm

## 2022-08-25 NOTE — Progress Notes (Signed)
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  Chief complaint of progressively worsening abdominal pain that has been ongoing for the past three weeks. In addition to the pain, she reports several accompanying symptoms that have manifested during this period, including a decrease in appetite, unintentional weight loss (the exact quantity of which remains unknown), episodes of diarrhea, and repeated instances of vomiting. The patient has specified that she has not engaged in any recent travel nor has she made any changes to her diet that could potentially explain her symptoms.  In ED : WBC 20.3, LA 2.2  CT > Possible infectious or ischemic colitis indicated by colonic wall thickening and symptomatology. ? Peritoneal carcinomatosis due to the presence of ascites and omental stranding. Mesenteric venous gas and pneumatosis: These findings necessitate investigation for underlying gastrointestinal pathology.   Subjective:  She continues to have abdominal pain. Drinking clears but when asked if she is drinking enough, she is not sure. She is not vomiting. She has no other compliants.   Assessment and Plan:  Infectious colitis vs ischemic colitis Abdominal pain with ascites, lactic acidosis,  pneumobilia, gastric pneumatosis, elevated LFTs, leukocytosis elevated lactic acid.   - cont Zosyn to cover potential infectious etiologies. 2/13> Paracentesis ~ 5 L of fluid: Total Nucleated Cell Count, Fluid: 1,399 Lymphs (Lymphocytes), Fluid: 19 Eos (Eosinophils), Fluid: 0 Neutrophil Count, Fluid: 63 Monocyte-Macrophage-Serous Fluid: 18 No organisms seen  on gram stain- culture pending - Appreciate general surgery - no need for surgery - PRN IV Dilaudid and Hydrocodone  - as abdomen is distended and I suspect this is partly responsible for her pain, will ask IR to assess for ascites and if present, do another paracentesis tomorrow of no more than 5L - clear liquids per general surgery - still waiting on path from ascitic fluid   Poor urine output 2/15 - IVF held 2/15 to prevent re accumulation of ascites and because patient was started on clears - agree with albumin that was given overnight - will continue this   SIRS  History of hypertension  - still tachycardic in reflex to holding lopressor  - 1/16> BP rising now- will resume Lopressor at 1/2 dose (25 mg) - mild leukocytosis still present  Macrocytosis, B12 deficiency -MCV 101.9- checked folate and B12 levels- folate 7.2- B12 211- will give a B12 injection today  Acute kidney injury on top of chronic kidney disease stage IIIa  - baseline creatinine level recorded as 1.1 mg/dL -  elevated at 2.2 mg/d in ED - improved to baseline now  Elevated liver enzymes and hyperbilirubinemia: - likely secondary to hypoperfusion - improved  Type 2 diabetes mellitus  - sugars are normal  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  Estimated BMI of 36.61 kg/m, indicating obesity which requires lifestyle modification for long-term management.    Code Status: Full Code Consultants: general surgery Level of Care: Level of care: Med-Surg Total time on patient care: 35 min DVT prophylaxis:  SCDs Start: 08/20/22 1402    Objective:   Vitals:   08/25/22 0221 08/25/22 0223 08/25/22 0412 08/25/22 0621  BP:  139/72 (!) 140/71  134/73  Pulse:  (!) 116 (!) 116 (!) 116  Resp:  18 18 17  $ Temp:  98.3 F (36.8 C) 98.2 F (36.8 C) 97.9 F (36.6 C)  TempSrc:  Oral Oral Oral  SpO2:  99% 98% 100%  Weight: 108.7 kg     Height:       Filed Weights   08/20/22 1036  08/25/22 0221  Weight: 99.8 kg 108.7 kg   Exam: General exam: Appears comfortable  HEENT: oral mucosa moist Respiratory system: Clear to auscultation.  Cardiovascular system: S1 & S2 heard  Gastrointestinal system: Abdomen soft, severely distended and tender,poor bowel sounds   Extremities: No cyanosis, clubbing or edema Psychiatry:  Mood & affect appropriate.    CBC: Recent Labs  Lab 08/20/22 1040 08/21/22 0254 08/22/22 0249 08/23/22 0540 08/24/22 0308 08/25/22 0413  WBC 20.3* 16.3* 13.3* 11.0* 11.8* 11.8*  NEUTROABS 17.3*  --   --   --   --   --   HGB 14.3 11.5* 12.5 13.2 13.4 12.8  HCT 43.4 34.5* 39.7 42.5 43.9 40.1  MCV 96.2 96.9 100.5* 101.2* 101.9* 100.3*  PLT 327 248 271 283 [id number]    Basic Metabolic Panel: Recent Labs  Lab 08/20/22 1325 08/21/22 0254 08/22/22 0249 08/23/22 0540 08/24/22 0308 08/25/22 0413  NA  --  137 136 138 139 137  K  --  3.6 3.9 4.1 4.1 4.0  CL  --  101 102 103 102 100  CO2  --  23 22 26 26 27  $ GLUCOSE  --  110* 96 110* 99 102*  BUN  --  44* 33* 23 19 16  $ CREATININE  --  2.02* 1.53* 1.14* 1.21* 1.16*  CALCIUM  --  8.1* 7.9* 8.1* 7.9* 8.0*  MG 1.9  --   --  1.7  --   --   PHOS 4.1  --   --   --   --   --     GFR: Estimated Creatinine Clearance: 53.8 mL/min (A) (by C-G formula based on SCr of 1.16 mg/dL (H)).  Scheduled Meds:  Chlorhexidine Gluconate Cloth  6 each Topical Daily   insulin aspart  0-15 Units Subcutaneous Q4H   lip balm   Topical BID   metoprolol tartrate  25 mg Oral BID   pantoprazole (PROTONIX) IV  40 mg Intravenous Q12H   simethicone  80 mg Oral QID   Continuous Infusions:  sodium chloride Stopped (08/25/22 0520)   ondansetron (ZOFRAN) IV     piperacillin-tazobactam (ZOSYN)  IV 12.5 mL/hr at 08/25/22 [id number]   Imaging and lab data was personally reviewed DG Abd Portable 1V  Result Date: 08/23/2022 CLINICAL DATA:  Abdominal pain. EXAM: PORTABLE ABDOMEN - 1 VIEW COMPARISON:  09/04/2010. FINDINGS: Gas-filled  bowel loops without dilation. No free air. The right abdominal wall is excluded from the field of view. No radiopaque calculi. Degenerative changes of the spine. IMPRESSION: Gas-filled bowel loops without dilation. Electronically Signed   By: Emmit Alexanders M.D.   On: 08/23/2022 11:01    LOS: 5 days   Author: Debbe Odea  08/25/2022 7:56 AM  To contact [facility]>   Check the care team in [facility] and look for the attending/consulting [place] provider listed  Log into [email or web address] and use [place]'s universal password   Go to> "[facility]"  and find provider  If you still have difficulty reaching the provider, please page the [facility] (Director on Call) for the Hospitalists listed on amion

## 2022-08-25 NOTE — Progress Notes (Signed)
   08/25/22 0223  Assess: MEWS Score  Temp 98.3 F (36.8 C)  BP 139/72  MAP (mmHg) 91  Pulse Rate (!) 116  Resp 18  Level of Consciousness Alert  SpO2 99 %  O2 Device Nasal Cannula  O2 Flow Rate (L/min) 2 L/min  Assess: MEWS Score  MEWS Temp 0  MEWS Systolic 0  MEWS Pulse 2  MEWS RR 0  MEWS LOC 0  MEWS Score 2  MEWS Score Color Yellow  Assess: if the MEWS score is Yellow or Red  Were vital signs taken at a resting state? Yes  Focused Assessment No change from prior assessment  Does the patient meet 2 or more of the SIRS criteria? Yes  Does the patient have a confirmed or suspected source of infection? Yes  Provider and Rapid Response Notified? Yes  MEWS guidelines implemented  Yes, yellow  Treat  MEWS Interventions Considered administering scheduled or prn medications/treatments as ordered  Take Vital Signs  Increase Vital Sign Frequency  Yellow: Q2hr x1, continue Q4hrs until patient remains green for 12hrs  Escalate  MEWS: Escalate Yellow: Discuss with charge nurse and consider notifying provider and/or RRT  Notify: Charge Nurse/RN  Name of Charge Nurse/RN Notified [facility]  Provider Notification  Provider Name/Title Raenette Rover, NP  Date Provider Notified 08/25/22  Time Provider Notified [phone]  Method of Notification Page  Notification Reason Change in status;Other (Comment) (yellow mews)  Provider response No new orders  Date of Provider Response 08/25/22  Time of Provider Response 0225  Notify: Rapid Response  Name of Rapid Response RN Notified Tim[place], RN  Date Rapid Response Notified 08/25/22  Time Rapid Response Notified 0223  Assess: SIRS CRITERIA  SIRS Temperature  0  SIRS Pulse 1  SIRS Respirations  0  SIRS WBC 0  SIRS Score Sum  1

## 2022-08-26 ENCOUNTER — Inpatient Hospital Stay (HOSPITAL_COMMUNITY): Payer: Medicare HMO

## 2022-08-26 DIAGNOSIS — R197 Diarrhea, unspecified: Secondary | ICD-10-CM | POA: Diagnosis not present

## 2022-08-26 DIAGNOSIS — R112 Nausea with vomiting, unspecified: Secondary | ICD-10-CM | POA: Diagnosis not present

## 2022-08-26 LAB — BASIC METABOLIC PANEL
Anion gap: 7 (ref 5–15)
BUN: 17 mg/dL (ref 8–23)
CO2: 26 mmol/L (ref 22–32)
Calcium: 7.8 mg/dL — ABNORMAL LOW (ref 8.9–10.3)
Chloride: 99 mmol/L (ref 98–111)
Creatinine, Ser: 0.99 mg/dL (ref 0.44–1.00)
GFR, Estimated: >60 mL/min (ref >60)
Glucose, Bld: 118 mg/dL — ABNORMAL HIGH (ref 70–99)
Potassium: 3.7 mmol/L (ref 3.5–5.1)
Sodium: 132 mmol/L — ABNORMAL LOW (ref 135–145)

## 2022-08-26 LAB — GLUCOSE, CAPILLARY
Glucose-Capillary: 109 mg/dL — ABNORMAL HIGH (ref 70–99)
Glucose-Capillary: 112 mg/dL — ABNORMAL HIGH (ref 70–99)
Glucose-Capillary: 113 mg/dL — ABNORMAL HIGH (ref 70–99)
Glucose-Capillary: 97 mg/dL (ref 70–99)
Glucose-Capillary: 98 mg/dL (ref 70–99)
Glucose-Capillary: 99 mg/dL (ref 70–99)

## 2022-08-26 LAB — CBC
HCT: 36.9 % (ref 36.0–46.0)
Hemoglobin: 11.7 g/dL — ABNORMAL LOW (ref 12.0–15.0)
MCH: 31.7 pg (ref 26.0–34.0)
MCHC: 31.7 g/dL (ref 30.0–36.0)
MCV: 100 fL (ref 80.0–100.0)
Platelets: 330 10*3/uL (ref 150–400)
RBC: 3.69 MIL/uL — ABNORMAL LOW (ref 3.87–5.11)
RDW: 12.2 % (ref 11.5–15.5)
WBC: 11.4 10*3/uL — ABNORMAL HIGH (ref 4.0–10.5)
nRBC: 0 % (ref 0.0–0.2)

## 2022-08-26 MED ORDER — SODIUM CHLORIDE 0.9 % IV SOLN: INTRAVENOUS | Status: DC

## 2022-08-26 MED ORDER — METOPROLOL TARTRATE 5 MG/5ML IV SOLN
5.0000 mg | Freq: Four times a day (QID) | INTRAVENOUS | Status: DC
  Administered 2022-08-26 – 2022-09-03 (×33): 5 mg via INTRAVENOUS
  Filled 2022-08-26 (×33): qty 5

## 2022-08-26 MED ORDER — LIDOCAINE HCL 1 % IJ SOLN
INTRAMUSCULAR | Status: AC
  Filled 2022-08-26: qty 20

## 2022-08-26 NOTE — Progress Notes (Signed)
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  Chief complaint of progressively worsening abdominal pain that has been ongoing for the past three weeks. In addition to the pain, she reports several accompanying symptoms that have manifested during this period, including a decrease in appetite, unintentional weight loss (the exact quantity of which remains unknown), episodes of diarrhea, and repeated instances of vomiting. The patient has specified that she has not engaged in any recent travel nor has she made any changes to her diet that could potentially explain her symptoms.  In ED : WBC 20.3, LA 2.2  CT > Possible infectious or ischemic colitis indicated by colonic wall thickening and symptomatology. ? Peritoneal carcinomatosis due to the presence of ascites and omental stranding. Mesenteric venous gas and pneumatosis: These findings necessitate investigation for underlying gastrointestinal pathology.   Subjective:  Large volume vomitus this AM. Abdomen still hurts. Does not want NG tube as the attempt at insertion was very painful in the ED.   Assessment and Plan:  Infectious colitis vs ischemic colitis Abdominal pain with ascites, lactic acidosis,  pneumobilia, gastric pneumatosis, elevated LFTs, leukocytosis elevated lactic acid.   - cont Zosyn to cover potential infectious etiologies. 2/13> Paracentesis ~ 5 L of fluid: Total Nucleated Cell Count, Fluid: 1,399 Lymphs (Lymphocytes), Fluid: 19 Eos (Eosinophils), Fluid: 0 Neutrophil Count, Fluid: 63 Monocyte-Macrophage-Serous Fluid: 18 No organisms seen on gram stain- culture pending -  Appreciate general surgery - no need for surgery - PRN IV Dilaudid and Hydrocodone  - s/p paracentesis today done for therapeutic reasons- 2 L removed - made NPO today due to vomiting - ascitic fluid cytology pending    SIRS with hypotension Hypertension now - IV lopressor until able to tolerate orals - mild leukocytosis still present- WBC ~ 11  Macrocytosis, B12 deficiency -MCV 101.9- checked folate and B12 levels- folate 7.2- B12 211 - given 1 gm B12 injection 2/16  Acute kidney injury on top of chronic kidney disease stage IIIa  - baseline creatinine level recorded as 1.1 mg/dL -  elevated at 2.2 mg/d in ED - improved to baseline now   Poor urine output 2/15 - IVF held 2/15 to prevent re accumulation of ascites and because patient was started on clears - cont albumin Q 8 hrs  Elevated liver enzymes and hyperbilirubinemia: - likely secondary to hypoperfusion - resolved  Type 2 diabetes mellitus  - sugars have been near normal   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  Estimated BMI of 36.61 kg/m, indicating obesity which requires lifestyle modification for long-term management.    Code Status: Full Code Consultants: general surgery Level of Care: Level of care: Med-Surg Total time on patient care: 35 min DVT prophylaxis:  SCDs Start: 08/20/22 1402    Objective:   Vitals:   08/25/22 1423 08/25/22 2150 08/26/22 0431 08/26/22 0500  BP: 130/73 (!) 154/87 (!) 153/98   Pulse: (!) 103 (!) 108 (!) 106   Resp: 18 18 16   $ Temp: 98.3 F (36.8 C) 98.5 F (36.9 C) 98.1 F (36.7 C)   TempSrc: Oral Oral Oral   SpO2: 98% (!) 88%  96%   Weight:    113.4 kg  Height:       Filed Weights   08/20/22 1036 08/25/22 0221 08/26/22 0500  Weight: 99.8 kg 108.7 kg 113.4 kg   Exam: General exam: Appears comfortable  HEENT: oral mucosa moist Respiratory system: Clear to auscultation.  Cardiovascular system: S1 & S2 heard  Gastrointestinal  system: Abdomen soft, remains severely distended and tender- no bowel sounds   Extremities: No cyanosis, clubbing or edema Psychiatry:  Mood & affect appropriate.     CBC: Recent Labs  Lab 08/20/22 1040 08/21/22 0254 08/22/22 0249 08/23/22 0540 08/24/22 0308 08/25/22 0413 08/26/22 0319  WBC 20.3*   < > 13.3* 11.0* 11.8* 11.8* 11.4*  NEUTROABS 17.3*  --   --   --   --   --   --   HGB 14.3   < > 12.5 13.2 13.4 12.8 11.7*  HCT 43.4   < > 39.7 42.5 43.9 40.1 36.9  MCV 96.2   < > 100.5* 101.2* 101.9* 100.3* 100.0  PLT 327   < > 271 283 299 280 330   < > = values in this interval not displayed.    Basic Metabolic Panel: Recent Labs  Lab 08/20/22 1325 08/21/22 0254 08/22/22 0249 08/23/22 0540 08/24/22 0308 08/25/22 0413 08/26/22 0319  NA  --    < > 136 138 139 137 132*  K  --    < > 3.9 4.1 4.1 4.0 3.7  CL  --    < > 102 103 102 100 99  CO2  --    < > 22 26 26 27 26  $ GLUCOSE  --    < > 96 110* 99 102* 118*  BUN  --    < > 33* 23 19 16 17  $ CREATININE  --    < > 1.53* 1.14* 1.21* 1.16* 0.99  CALCIUM  --    < > 7.9* 8.1* 7.9* 8.0* 7.8*  MG 1.9  --   --  1.7  --   --   --   PHOS 4.1  --   --   --   --   --   --    < > = values in this interval not displayed.    GFR: Estimated Creatinine Clearance: 64.5 mL/min (by C-G formula based on SCr of 0.99 mg/dL).  Scheduled Meds:  Chlorhexidine Gluconate Cloth  6 each Topical Daily   feeding supplement  1 Container Oral TID BM   insulin aspart  0-15 Units Subcutaneous Q4H   lidocaine       lip balm   Topical BID   metoprolol tartrate  25 mg Oral BID   pantoprazole (PROTONIX) IV  40 mg Intravenous Q12H   simethicone  80 mg Oral QID   Continuous Infusions:  sodium chloride Stopped (08/25/22 0520)   albumin human 25 g (08/26/22 0552)   ondansetron (ZOFRAN) IV     Imaging and lab data was personally reviewed DG Abd Portable 1V  Result Date: 08/26/2022 CLINICAL DATA:  Nausea vomiting EXAM: PORTABLE ABDOMEN - 1 VIEW  COMPARISON:  08/23/2022 FINDINGS: The bowel gas pattern is normal. No radio-opaque calculi or other significant radiographic abnormality are seen. IMPRESSION: Nonspecific bowel gas pattern. Electronically Signed   By: Sammie Bench M.D.   On: 08/26/2022 10:10    LOS: 6 days   Author: Eunice Blase Sharnita Bogucki  08/26/2022 1:07 PM  To contact [facility]>   Check the care team in  [facility] and look for the attending/consulting [facility] provider listed  Log into [email or web address] and use [place]'s universal password   Go to> "[facility]"  and find provider  If you still have difficulty reaching the provider, please page the [facility] (Director on Call) for the Hospitalists listed on amion

## 2022-08-26 NOTE — Progress Notes (Signed)
   Subjective/Chief Complaint: Feels about the same, with clears she had n/v, thinks she might have had flatus, no bm   Objective: Vital signs in last 24 hours: Temp:  [97.8 F (36.6 C)-98.5 F (36.9 C)] 98.1 F (36.7 C) (02/17 0431) Pulse Rate:  [103-110] 106 (02/17 0431) Resp:  [16-18] 16 (02/17 0431) BP: (126-154)/(67-98) 153/98 (02/17 0431) SpO2:  [88 %-98 %] 96 % (02/17 0431) Weight:  [113.4 kg] 113.4 kg (02/17 0500) Last BM Date : 08/20/22  Intake/Output from previous day: 02/16 0701 - 02/17 0700 In: 1121.4 [P.O.:840; I.V.:1.2; IV Piggyback:280.2] Out: 225 [Urine:225] Intake/Output this shift: No intake/output data recorded.  Ab distended mildly tender no peritonitis  Lab Results:  Recent Labs    08/25/22 0413 08/26/22 0319  WBC 11.8* 11.4*  HGB 12.8 11.7*  HCT 40.1 36.9  PLT 280 330   BMET Recent Labs    08/25/22 0413 08/26/22 0319  NA 137 132*  K 4.0 3.7  CL 100 99  CO2 27 26  GLUCOSE 102* 118*  BUN 16 17  CREATININE 1.16* 0.99  CALCIUM 8.0* 7.8*   PT/INR No results for input(s): "LABPROT", "INR" in the last 72 hours. ABG No results for input(s): "PHART", "HCO3" in the last 72 hours.  Invalid input(s): "PCO2", "PO2"  Studies/Results: No results found.  Anti-infectives: Anti-infectives (From admission, onward)    Start     Dose/Rate Route Frequency Ordered Stop   08/20/22 2000  piperacillin-tazobactam (ZOSYN) IVPB 3.375 g        3.375 g 12.5 mL/hr over 240 Minutes Intravenous Every 8 hours 08/20/22 1325 08/25/22 1918   08/20/22 1130  piperacillin-tazobactam (ZOSYN) IVPB 3.375 g        3.375 g 100 mL/hr over 30 Minutes Intravenous  Once 08/20/22 1126 08/20/22 1223       Assessment/Plan: Abdominal pain, pneumobilia, gastric pneumatosis, N/V/D - Reports her pain is still present but stable today. No peritonitis on exam. Stable tachycardia. Afebrile. WBC stable. No indication for emergency surgery currently. not sure of source of all  of this though. Not sure cytology from paracentesis going to change anything either. -will make npo, will check xray not sure if this is ascites related distention if reaccumulated or ileus (she refused ng tube if it is) - Cont abx therapy and BID PPI - Awaiting cytology from paracentesis - Will continue to follow. -at some point may need dx lsc also   FEN -NPO, at some point soon might need to consider TPN VTE - SCDs, ok for chemical prophylaxis from our standpoint ID - zosyn 2/11 >>  Foley - per primary    DM Hx of seizures HTN IBS Morbid obesity GAD AKI - resolved   I reviewed hospitalist notes, last 24 h vitals and pain scores, last 48 h intake and output, last 24 h labs and trends, and last 24 h imaging results.  This care required moderate level of medical decision making.   Rolm Bookbinder 08/26/2022

## 2022-08-26 NOTE — Plan of Care (Signed)
  Problem: Education: Goal: Knowledge of General Education information will improve Description Including pain rating scale, medication(s)/side effects and non-pharmacologic comfort measures Outcome: Progressing   

## 2022-08-26 NOTE — Procedures (Signed)
PROCEDURE SUMMARY:  Successful US guided paracentesis from left lateral abdomen.  Yielded 2.2 liters of clear yellow fluid.  No immediate complications.  Patient tolerated well.  EBL = trace  Fluid previously sent for labs. No new labs ordered today.  Zaliah Wissner S Gearld Kerstein PA-C 08/26/2022 11:09 AM

## 2022-08-26 NOTE — Plan of Care (Signed)

## 2022-08-27 ENCOUNTER — Inpatient Hospital Stay (HOSPITAL_COMMUNITY): Payer: Medicare HMO

## 2022-08-27 DIAGNOSIS — R112 Nausea with vomiting, unspecified: Secondary | ICD-10-CM | POA: Diagnosis not present

## 2022-08-27 DIAGNOSIS — R197 Diarrhea, unspecified: Secondary | ICD-10-CM | POA: Diagnosis not present

## 2022-08-27 LAB — BASIC METABOLIC PANEL
Anion gap: 13 (ref 5–15)
BUN: 17 mg/dL (ref 8–23)
CO2: 26 mmol/L (ref 22–32)
Calcium: 8.5 mg/dL — ABNORMAL LOW (ref 8.9–10.3)
Chloride: 103 mmol/L (ref 98–111)
Creatinine, Ser: 0.98 mg/dL (ref 0.44–1.00)
GFR, Estimated: >60 mL/min (ref >60)
Glucose, Bld: 94 mg/dL (ref 70–99)
Potassium: 3.7 mmol/L (ref 3.5–5.1)
Sodium: 142 mmol/L (ref 135–145)

## 2022-08-27 LAB — GLUCOSE, CAPILLARY
Glucose-Capillary: 101 mg/dL — ABNORMAL HIGH (ref 70–99)
Glucose-Capillary: 116 mg/dL — ABNORMAL HIGH (ref 70–99)
Glucose-Capillary: 64 mg/dL — ABNORMAL LOW (ref 70–99)
Glucose-Capillary: 77 mg/dL (ref 70–99)
Glucose-Capillary: 87 mg/dL (ref 70–99)
Glucose-Capillary: 88 mg/dL (ref 70–99)
Glucose-Capillary: 91 mg/dL (ref 70–99)
Glucose-Capillary: 94 mg/dL (ref 70–99)

## 2022-08-27 LAB — CBC
HCT: 34 % — ABNORMAL LOW (ref 36.0–46.0)
Hemoglobin: 10.8 g/dL — ABNORMAL LOW (ref 12.0–15.0)
MCH: 32.1 pg (ref 26.0–34.0)
MCHC: 31.8 g/dL (ref 30.0–36.0)
MCV: 101.2 fL — ABNORMAL HIGH (ref 80.0–100.0)
Platelets: 254 10*3/uL (ref 150–400)
RBC: 3.36 MIL/uL — ABNORMAL LOW (ref 3.87–5.11)
RDW: 12.4 % (ref 11.5–15.5)
WBC: 13.3 10*3/uL — ABNORMAL HIGH (ref 4.0–10.5)
nRBC: 0 % (ref 0.0–0.2)

## 2022-08-27 LAB — AEROBIC/ANAEROBIC CULTURE W GRAM STAIN (SURGICAL/DEEP WOUND): Culture: NO GROWTH

## 2022-08-27 MED ORDER — IOHEXOL 9 MG/ML PO SOLN
500.0000 mL | ORAL | Status: AC
  Administered 2022-08-27: 500 mL via ORAL

## 2022-08-27 MED ORDER — INSULIN ASPART 100 UNIT/ML IJ SOLN: 0.0000 [IU] | Freq: Three times a day (TID) | INTRAMUSCULAR | Status: DC

## 2022-08-27 MED ORDER — IOHEXOL 300 MG/ML  SOLN
100.0000 mL | Freq: Once | INTRAMUSCULAR | Status: AC | PRN
  Administered 2022-08-27: 100 mL via INTRAVENOUS

## 2022-08-27 MED ORDER — DEXTROSE 50 % IV SOLN
25.0000 mL | Freq: Once | INTRAVENOUS | Status: AC
  Administered 2022-08-27: 25 mL via INTRAVENOUS
  Filled 2022-08-27 (×2): qty 50

## 2022-08-27 NOTE — Progress Notes (Signed)
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  Chief complaint of progressively worsening abdominal pain that has been ongoing for the past three weeks. In addition to the pain, she reports several accompanying symptoms that have manifested during this period, including a decrease in appetite, unintentional weight loss (the exact quantity of which remains unknown), episodes of diarrhea, and repeated instances of vomiting. The patient has specified that she has not engaged in any recent travel nor has she made any changes to her diet that could potentially explain her symptoms.  In ED : WBC 20.3, LA 2.2  CT > Possible infectious or ischemic colitis indicated by colonic wall thickening and symptomatology. ? Peritoneal carcinomatosis due to the presence of ascites and omental stranding. Mesenteric venous gas and pneumatosis: These findings necessitate investigation for underlying gastrointestinal pathology.   Subjective:  Having pain through out her abdomen still. However, the pain is less since the paracentesis.   Assessment and Plan:  Infectious colitis vs ischemic colitis Abdominal pain with ascites, lactic acidosis,  pneumobilia, gastric pneumatosis, elevated LFTs, leukocytosis elevated lactic acid.   - cont Zosyn to cover potential infectious etiologies. 2/13> Paracentesis ~ 5 L of fluid: Total Nucleated Cell Count, Fluid: 1,399 Lymphs (Lymphocytes), Fluid: 19 Eos (Eosinophils), Fluid: 0 Neutrophil Count, Fluid: 63 Monocyte-Macrophage-Serous Fluid: 18 No organisms seen on gram stain- culture negative - Appreciate general surgery - no  need for surgery - PRN IV Dilaudid and Hydrocodone  - s/p paracentesis again on 2/17 done for therapeutic reasons- 2 L removed - made NPO from clears on 2/17 due to vomiting - NS at 50 cc/hr to prevent re accumulation of ascites - ascitic fluid cytology pending - obtaining another CT of abdomen/pelvis today as she is not progressing    SIRS with hypotension Hypertension now - IV lopressor until able to tolerate orals - mild leukocytosis still present- WBC 11> 13.3  Macrocytosis, B12 deficiency -MCV 101.9- checked folate and B12 levels- folate 7.2- B12 211 - given 1 gm B12 injection 2/16  Acute kidney injury on top of chronic kidney disease stage IIIa  - baseline creatinine level recorded as 1.1 mg/dL -  elevated at 2.2 mg/d in ED - improved to baseline now   Poor urine output 2/15 - IVF held 2/15 to prevent re accumulation of ascites and because patient was started on clears - cont albumin Q 8 hrs  Elevated liver enzymes and hyperbilirubinemia: - likely secondary to hypoperfusion - resolved  Type 2 diabetes mellitus  - sugars have been near normal & slightly low today at 64- not receiving any insulin  Previous cerebrovascular accident in 2012 with residual slow speech rate.  Estimated BMI of 36.61 kg/m, indicating obesity which requires lifestyle modification for long-term management.    Code Status: Full Code Consultants: general surgery Level of Care: Level of care: Med-Surg Total time on patient care: 35 min DVT prophylaxis:  SCDs Start: 08/20/22 1402    Objective:   Vitals:   08/27/22 0024 08/27/22 0441 08/27/22 0500 08/27/22 0902  BP: (!) 156/84 (!) 157/88  (!) 153/73  Pulse: (!) 119 (!) 111  (!) 107  Resp: 16  18  18  Temp: 97.8 F (36.6 C) 98.3 F (36.8 C)  97.9 F (36.6 C)  TempSrc:  Oral  Oral  SpO2: 99% 98%  99%  Weight:   111 kg   Height:       Filed Weights   08/25/22 0221 08/26/22 0500 08/27/22 0500  Weight: 108.7 kg 113.4 kg 111 kg    Exam: General exam: Appears comfortable  HEENT: oral mucosa moist Respiratory system: Clear to auscultation.  Cardiovascular system: S1 & S2 heard  Gastrointestinal system: Abdomen soft, significantly distended but less than yesterday. Diffusely tender, poorl bowel sounds   Extremities: No cyanosis, clubbing or edema Psychiatry:  Mood & affect appropriate.  .     CBC: Recent Labs  Lab 08/23/22 0540 08/24/22 0308 08/25/22 0413 08/26/22 0319 08/27/22 0828  WBC 11.0* 11.8* 11.8* 11.4* 13.3*  HGB 13.2 13.4 12.8 11.7* 10.8*  HCT 42.5 43.9 40.1 36.9 34.0*  MCV 101.2* 101.9* 100.3* 100.0 101.2*  PLT 283 299 280 330 0000000    Basic Metabolic Panel: Recent Labs  Lab 08/20/22 1325 08/21/22 0254 08/23/22 0540 08/24/22 0308 08/25/22 0413 08/26/22 0319 08/27/22 0828  NA  --    < > 138 139 137 132* 142  K  --    < > 4.1 4.1 4.0 3.7 3.7  CL  --    < > 103 102 100 99 103  CO2  --    < > 26 26 27 26 26  $ GLUCOSE  --    < > 110* 99 102* 118* 94  BUN  --    < > 23 19 16 17 17  $ CREATININE  --    < > 1.14* 1.21* 1.16* 0.99 0.98  CALCIUM  --    < > 8.1* 7.9* 8.0* 7.8* 8.5*  MG 1.9  --  1.7  --   --   --   --   PHOS 4.1  --   --   --   --   --   --    < > = values in this interval not displayed.    GFR: Estimated Creatinine Clearance: 64.4 mL/min (by C-G formula based on SCr of 0.98 mg/dL).  Scheduled Meds:  Chlorhexidine Gluconate Cloth  6 each Topical Daily   feeding supplement  1 Container Oral TID BM   insulin aspart  0-9 Units Subcutaneous TID WC   lip balm   Topical BID   metoprolol tartrate  5 mg Intravenous Q6H   pantoprazole (PROTONIX) IV  40 mg Intravenous Q12H   simethicone  80 mg Oral QID   Continuous Infusions:  sodium chloride Stopped (08/25/22 0520)   sodium chloride 50 mL/hr at 08/27/22 0549   albumin human 25 g (08/27/22 0614)   ondansetron (ZOFRAN) IV     Imaging and lab data was personally reviewed US Paracentesis  Result Date: 08/27/2022 INDICATION:  Recurrent ascites of unknown etiology. Request for therapeutic paracentesis. EXAM: ULTRASOUND GUIDED PARACENTESIS MEDICATIONS: 1% lidocaine 20 mL COMPLICATIONS: None immediate. PROCEDURE: Informed written consent was obtained from the patient after a discussion of the risks, benefits and alternatives to treatment. A timeout was performed prior to the initiation of the procedure. Initial ultrasound scanning demonstrates a large amount of ascites within the left lateral abdomen. The left lateral abdomen was prepped and draped in the usual sterile fashion. 1% lidocaine was used for local anesthesia. Following this, a 19 gauge, 10-cm, Yueh catheter was introduced. An ultrasound image was saved for documentation  purposes. The paracentesis was performed. The catheter was removed and a dressing was applied. The patient tolerated the procedure well without immediate post procedural complication. FINDINGS: A total of approximately 2.2 L of clear yellow fluid was removed. IMPRESSION: Successful ultrasound-guided paracentesis yielding 2.2 liters of peritoneal fluid. Procedure performed by: Gareth Eagle, PA-C Electronically Signed   By: Albin Felling M.D.   On: 08/27/2022 11:34   DG Abd Portable 1V  Result Date: 08/26/2022 CLINICAL DATA:  Nausea vomiting EXAM: PORTABLE ABDOMEN - 1 VIEW COMPARISON:  08/23/2022 FINDINGS: The bowel gas pattern is normal. No radio-opaque calculi or other significant radiographic abnormality are seen. IMPRESSION: Nonspecific bowel gas pattern. Electronically Signed   By: Sammie Bench M.D.   On: 08/26/2022 10:10    LOS: 7 days   Author: Debbe Odea  08/27/2022 12:21 PM  To contact [facility]>   Check the care team in [facility] and look for the attending/consulting [place] provider listed  Log into [email or web address] and use [place]'s universal password   Go to> "[facility]"  and find provider  If you still have difficulty reaching the provider, please page the [facility] (Director on  Call) for the Hospitalists listed on amion

## 2022-08-27 NOTE — Plan of Care (Signed)
  Problem: Education: Goal: Ability to describe self-care measures that may prevent or decrease complications (Diabetes Survival Skills Education) will improve Outcome: Progressing Goal: Individualized Educational Video(s) Outcome: Progressing   Problem: Education: Goal: Individualized Educational Video(s) Outcome: Progressing   Problem: Coping: Goal: Ability to adjust to condition or change in health will improve Outcome: Progressing   Problem: Fluid Volume: Goal: Ability to maintain a balanced intake and output will improve Outcome: Progressing   Problem: Health Behavior/Discharge Planning: Goal: Ability to identify and utilize available resources and services will improve Outcome: Progressing Goal: Ability to manage health-related needs will improve Outcome: Progressing   Problem: Metabolic: Goal: Ability to maintain appropriate glucose levels will improve Outcome: Progressing   Problem: Nutritional: Goal: Maintenance of adequate nutrition will improve Outcome: Progressing Goal: Progress toward achieving an optimal weight will improve Outcome: Progressing   Problem: Skin Integrity: Goal: Risk for impaired skin integrity will decrease Outcome: Progressing   Problem: Tissue Perfusion: Goal: Adequacy of tissue perfusion will improve Outcome: Progressing   Problem: Education: Goal: Knowledge of General Education information will improve Description: Including pain rating scale, medication(s)/side effects and non-pharmacologic comfort measures Outcome: Progressing   Problem: Health Behavior/Discharge Planning: Goal: Ability to manage health-related needs will improve Outcome: Progressing   Problem: Clinical Measurements: Goal: Ability to maintain clinical measurements within normal limits will improve Outcome: Progressing Goal: Will remain free from infection Outcome: Progressing Goal: Diagnostic test results will improve Outcome: Progressing Goal: Respiratory  complications will improve Outcome: Progressing Goal: Cardiovascular complication will be avoided Outcome: Progressing   Problem: Activity: Goal: Risk for activity intolerance will decrease Outcome: Progressing   Problem: Nutrition: Goal: Adequate nutrition will be maintained Outcome: Progressing   Problem: Coping: Goal: Level of anxiety will decrease Outcome: Progressing   Problem: Elimination: Goal: Will not experience complications related to bowel motility Outcome: Progressing Goal: Will not experience complications related to urinary retention Outcome: Progressing   Problem: Pain Managment: Goal: General experience of comfort will improve Outcome: Progressing   Problem: Safety: Goal: Ability to remain free from injury will improve Outcome: Progressing   Problem: Skin Integrity: Goal: Risk for impaired skin integrity will decrease Outcome: Progressing

## 2022-08-27 NOTE — Progress Notes (Signed)
Hypoglycemic Event  CBG: 64  Treatment: Dextrose 50% solution 25 mL   Symptoms: None  Follow-up CBG: Time: 1240 CBG Result: 116  Possible Reasons for Event: Patient NPO. No sugar intake.   Comments/MD notified: Notified Dr. Debbe Odea, MD. Received the order to give Dextrose 50% solution 25 mL. Dr. Wynelle Cleveland, MD also adjusted patient's sliding scale insulin. No other further orders from Dr. Wynelle Cleveland, MD.    Anda Kraft, RN

## 2022-08-27 NOTE — Plan of Care (Signed)
  Problem: Education: Goal: Knowledge of General Education information will improve Description Including pain rating scale, medication(s)/side effects and non-pharmacologic comfort measures Outcome: Progressing   

## 2022-08-27 NOTE — Progress Notes (Signed)
Patient just returned from abdominal CT.

## 2022-08-27 NOTE — Progress Notes (Signed)
   Subjective/Chief Complaint: Para yesterday with 2.2 liters, still withsome upper ab pain, no flatus or bm (will not do ng)   Objective: Vital signs in last 24 hours: Temp:  [97.8 F (36.6 C)-98.7 F (37.1 C)] 98.3 F (36.8 C) (02/18 0441) Pulse Rate:  [107-119] 111 (02/18 0441) Resp:  [14-18] 18 (02/18 0441) BP: (125-168)/(68-88) 157/88 (02/18 0441) SpO2:  [97 %-99 %] 98 % (02/18 0441) Weight:  [id number] kg] 111 kg (02/18 0500) Last BM Date : 08/20/22  Intake/Output from previous day: 02/17 0701 - 02/18 0700 In: 827.2 [P.O.:30; I.V.:321.7; IV Piggyback:475.6] Out: 1500 [Urine:1500] Intake/Output this shift: No intake/output data recorded.  Ab distended (a little less than yesterday), mildly tender upper abdomen  Lab Results:  Recent Labs    08/25/22 0413 08/26/22 0319  WBC 11.8* 11.4*  HGB 12.8 11.7*  HCT 40.1 36.9  PLT 280 330   BMET Recent Labs    08/25/22 0413 08/26/22 0319  NA 137 132*  K 4.0 3.7  CL 100 99  CO2 27 26  GLUCOSE 102* 118*  BUN 16 17  CREATININE 1.16* 0.99  CALCIUM 8.0* 7.8*   PT/INR No results for input(s): "LABPROT", "INR" in the last 72 hours. ABG No results for input(s): "PHART", "HCO3" in the last 72 hours.  Invalid input(s): "PCO2", "PO2"  Studies/Results: DG Abd Portable 1V  Result Date: 08/26/2022 CLINICAL DATA:  Nausea vomiting EXAM: PORTABLE ABDOMEN - 1 VIEW COMPARISON:  08/23/2022 FINDINGS: The bowel gas pattern is normal. No radio-opaque calculi or other significant radiographic abnormality are seen. IMPRESSION: Nonspecific bowel gas pattern. Electronically Signed   By: Sammie Bench M.D.   On: 08/26/2022 10:10    Anti-infectives: Anti-infectives (From admission, onward)    Start     Dose/Rate Route Frequency Ordered Stop   08/20/22 2000  piperacillin-tazobactam (ZOSYN) IVPB 3.375 g        3.375 g 12.5 mL/hr over 240 Minutes Intravenous Every 8 hours 08/20/22 1325 08/25/22 1918   08/20/22 1130   piperacillin-tazobactam (ZOSYN) IVPB 3.375 g        3.375 g 100 mL/hr over 30 Minutes Intravenous  Once 08/20/22 1126 08/20/22 1223       Assessment/Plan: Abdominal pain, pneumobilia, gastric pneumatosis, N/V/D - Reports her pain is still present but stable today. No peritonitis on exam. No indication for emergency surgery currently. not sure of source of all of this though. Not sure cytology from paracentesis going to change anything either. I think she needs at least a repeat ct scan today as there has not been follow up for a week with concerning findings.  She may at some point need to be explored as well. Hopefully can try iv contrast and small amt oral contrast (dont think she will tolerate much) -npo - Cont abx therapy and BID PPI - Awaiting cytology from paracentesis    FEN -NPO, would consider starting tpn VTE - SCDs, ok for chemical prophylaxis from our standpoint ID - zosyn 2/11 >>  Foley - per primary    DM Hx of seizures HTN IBS Morbid obesity GAD AKI - resolved   I reviewed hospitalist notes, last 24 h vitals and pain scores, last 48 h intake and output, last 24 h labs and trends, and last 24 h imaging results. I reveiwed her xray from yesterday and IR report of paracentesis.  Discussed with primary team  This care required moderate level of medical decision making.   Rolm Bookbinder 08/27/2022

## 2022-08-28 ENCOUNTER — Inpatient Hospital Stay: Payer: Self-pay

## 2022-08-28 DIAGNOSIS — R197 Diarrhea, unspecified: Secondary | ICD-10-CM | POA: Diagnosis not present

## 2022-08-28 DIAGNOSIS — R112 Nausea with vomiting, unspecified: Secondary | ICD-10-CM | POA: Diagnosis not present

## 2022-08-28 LAB — GLUCOSE, CAPILLARY
Glucose-Capillary: 103 mg/dL — ABNORMAL HIGH (ref 70–99)
Glucose-Capillary: 105 mg/dL — ABNORMAL HIGH (ref 70–99)
Glucose-Capillary: 79 mg/dL (ref 70–99)
Glucose-Capillary: 82 mg/dL (ref 70–99)
Glucose-Capillary: 77 mg/dL (ref 70–99)

## 2022-08-28 LAB — BASIC METABOLIC PANEL
Anion gap: 12 (ref 5–15)
BUN: 16 mg/dL (ref 8–23)
CO2: 28 mmol/L (ref 22–32)
Calcium: 8.5 mg/dL — ABNORMAL LOW (ref 8.9–10.3)
Chloride: 105 mmol/L (ref 98–111)
Creatinine, Ser: 0.97 mg/dL (ref 0.44–1.00)
GFR, Estimated: >60 mL/min (ref >60)
Glucose, Bld: 98 mg/dL (ref 70–99)
Potassium: 4.2 mmol/L (ref 3.5–5.1)
Sodium: 145 mmol/L (ref 135–145)

## 2022-08-28 LAB — CBC
HCT: 36 % (ref 36.0–46.0)
Hemoglobin: 11.3 g/dL — ABNORMAL LOW (ref 12.0–15.0)
MCH: 31.6 pg (ref 26.0–34.0)
MCHC: 31.4 g/dL (ref 30.0–36.0)
MCV: 100.6 fL — ABNORMAL HIGH (ref 80.0–100.0)
Platelets: 255 10*3/uL (ref 150–400)
RBC: 3.58 MIL/uL — ABNORMAL LOW (ref 3.87–5.11)
RDW: 12.4 % (ref 11.5–15.5)
WBC: 13.7 10*3/uL — ABNORMAL HIGH (ref 4.0–10.5)
nRBC: 0 % (ref 0.0–0.2)

## 2022-08-28 MED ORDER — METOCLOPRAMIDE HCL 5 MG/ML IJ SOLN
10.0000 mg | Freq: Four times a day (QID) | INTRAMUSCULAR | Status: DC
  Administered 2022-08-28 – 2022-09-05 (×32): 10 mg via INTRAVENOUS
  Filled 2022-08-28 (×32): qty 2

## 2022-08-28 MED ORDER — INSULIN ASPART 100 UNIT/ML IJ SOLN
0.0000 [IU] | INTRAMUSCULAR | Status: AC
  Administered 2022-08-30 (×4): 1 [IU] via SUBCUTANEOUS

## 2022-08-28 MED ORDER — BISACODYL 10 MG RE SUPP
10.0000 mg | Freq: Every day | RECTAL | Status: DC
  Administered 2022-08-28 – 2022-09-03 (×6): 10 mg via RECTAL
  Filled 2022-08-28 (×7): qty 1

## 2022-08-28 NOTE — Progress Notes (Signed)
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  Chief complaint of progressively worsening abdominal pain that has been ongoing for the past three weeks. In addition to the pain, she reports several accompanying symptoms that have manifested during this period, including a decrease in appetite, unintentional weight loss (the exact quantity of which remains unknown), episodes of diarrhea, and repeated instances of vomiting. The patient has specified that she has not engaged in any recent travel nor has she made any changes to her diet that could potentially explain her symptoms.  In ED : WBC 20.3, LA 2.2  CT > Possible infectious or ischemic colitis indicated by colonic wall thickening and symptomatology. ? Peritoneal carcinomatosis due to the presence of ascites and omental stranding. Mesenteric venous gas and pneumatosis: These findings necessitate investigation for underlying gastrointestinal pathology.   Subjective:  Continues to have abdominal pain. No vomiting.   Assessment and Plan:  Infectious colitis vs ischemic colitis Abdominal pain with ascites, lactic acidosis,  pneumobilia, gastric pneumatosis, elevated LFTs, leukocytosis elevated lactic acid.   - cont Zosyn to cover potential infectious etiologies. 2/13> Paracentesis ~ 5 L of fluid: Total Nucleated Cell Count, Fluid: 1,399 Lymphs (Lymphocytes), Fluid: 19 Eos (Eosinophils), Fluid: 0 Neutrophil Count, Fluid: 63 Monocyte-Macrophage-Serous Fluid: 18 No organisms seen on gram stain- culture negative - Appreciate general surgery - no need for surgery - PRN IV Dilaudid and  Hydrocodone  - s/p paracentesis again on 2/17 done for therapeutic reasons- 2 L removed - made NPO from clears on 2/17 due to vomiting - NS only at 50 cc/hr to prevent re accumulation of ascites- Albumin TID - ascitic fluid cytology still pending -  repeat CT of abdomen/pelvis 2/18> pneumatosis improving but lots of gas in the stomach and still has mild- mod ascites - gen surgery has ordered an NG but she is still refusing - have ordered TPN and PICC as she still has no bowel sounds - add Reglan QID and Dulcolax suppository daily - ambulate in hall     SIRS with hypotension Hypertension now - IV lopressor until able to tolerate orals - mild leukocytosis still present- WBC 11> 13.3  Macrocytosis, B12 deficiency -MCV 101.9- checked folate and B12 levels- folate 7.2- B12 211 - given 1 gm B12 injection 2/16  Acute kidney injury on top of chronic kidney disease stage IIIa  - baseline creatinine level recorded as 1.1 mg/dL -  elevated at 2.2 mg/d in ED - improved to baseline now   Poor urine output 2/15 - IVF held 2/15 to prevent re accumulation of ascites and because patient was started on clears - cont albumin Q 8 hrs  Elevated liver enzymes and hyperbilirubinemia: - likely secondary to hypoperfusion - resolved  Type 2 diabetes mellitus  - sugars have been near normal & slightly low today at 64- not receiving any insulin  Previous cerebrovascular accident in 2012 with residual slow speech rate.  Estimated BMI of 36.61 kg/m, indicating obesity which requires lifestyle modification for long-term management.    Code Status: Full Code Consultants: general surgery Level of Care: Level of care: Med-Surg Total time on patient care: 35 min DVT  prophylaxis:  SCDs Start: 08/20/22 1402    Objective:   Vitals:   08/28/22 0403 08/28/22 1014 08/28/22 1139 08/28/22 1313  BP: (!) 159/89 (!) 174/83 (!) 159/89 (!) 176/96  Pulse: (!) 112 (!) 110 (!) 110 (!) 117  Resp: 16 18 18    $ Temp: 98.4 F (36.9 C) 97.8 F (36.6 C) 98 F (36.7 C)   TempSrc: Oral  Oral   SpO2: 98% 92% 92%   Weight: 112.5 kg     Height:       Filed Weights   08/26/22 0500 08/27/22 0500 08/28/22 0403  Weight: 113.4 kg 111 kg 112.5 kg   Exam: General exam: Appears comfortable  HEENT: oral mucosa moist Respiratory system: Clear to auscultation.  Cardiovascular system: S1 & S2 heard  Gastrointestinal system: Abdomen soft,distended with poor bowel, Normal bowel sounds   Extremities: No cyanosis, clubbing or edema Psychiatry:  Mood & affect appropriate.      CBC: Recent Labs  Lab 08/24/22 0308 08/25/22 0413 08/26/22 0319 08/27/22 0828 08/28/22 0316  WBC 11.8* 11.8* 11.4* 13.3* 13.7*  HGB 13.4 12.8 11.7* 10.8* 11.3*  HCT 43.9 40.1 36.9 34.0* 36.0  MCV 101.9* 100.3* 100.0 101.2* 100.6*  PLT 299 280 330 [id number]    Basic Metabolic Panel: Recent Labs  Lab 08/23/22 0540 08/24/22 0308 08/25/22 0413 08/26/22 0319 08/27/22 0828  NA 138 139 137 132* 142  K 4.1 4.1 4.0 3.7 3.7  CL 103 102 100 99 103  CO2 26 26 27 26 26  $ GLUCOSE 110* 99 102* 118* 94  BUN 23 19 16 17 17  $ CREATININE 1.14* 1.21* 1.16* 0.99 0.98  CALCIUM 8.1* 7.9* 8.0* 7.8* 8.5*  MG 1.7  --   --   --   --     GFR: Estimated Creatinine Clearance: 64.9 mL/min (by C-G formula based on SCr of 0.98 mg/dL).  Scheduled Meds:  Chlorhexidine Gluconate Cloth  6 each Topical Daily   insulin aspart  0-9 Units Subcutaneous TID WC   lip balm   Topical BID   metoprolol tartrate  5 mg Intravenous Q6H   pantoprazole (PROTONIX) IV  40 mg Intravenous Q12H   simethicone  80 mg Oral QID   Continuous Infusions:  sodium chloride Stopped (08/25/22 0520)   sodium chloride 50 mL/hr at 08/28/22 1150   albumin human 25 g (08/28/22 1315)   ondansetron (ZOFRAN) IV     Imaging and lab data was personally reviewed CT ABDOMEN PELVIS W CONTRAST  Result Date: 08/27/2022 CLINICAL DATA:  Abdominal pain, history of recent paracentesis  EXAM: CT ABDOMEN AND PELVIS WITH CONTRAST TECHNIQUE: Multidetector CT imaging of the abdomen and pelvis was performed using the standard protocol following bolus administration of intravenous contrast. RADIATION DOSE REDUCTION: This exam was performed according to the departmental dose-optimization program which includes automated exposure control, adjustment of the mA and/or kV according to patient size and/or use of iterative reconstruction technique. CONTRAST:  144m OMNIPAQUE IOHEXOL 300 MG/ML  SOLN COMPARISON:  08/20/2022 FINDINGS: Lower chest: New small left pleural effusion is noted with left lower lobe consolidation. No parenchymal nodules are seen. Hepatobiliary: Previously seen portal venous air within the liver has resolved in the interval no focal mass lesion is seen. The gallbladder is well distended with multiple gallstones stable from the prior exam. No biliary ductal dilatation is seen. Pancreas: Unremarkable. No pancreatic ductal dilatation or surrounding inflammatory changes. Spleen: Normal in size without focal abnormality. Adrenals/Urinary Tract: Adrenal glands are within normal limits.  Kidneys demonstrate a normal enhancement pattern bilaterally. No renal calculi or obstructive changes are seen. Normal excretion is seen bilaterally. The bladder is decompressed by Foley catheter. Stomach/Bowel: Colon is predominately decompressed. The transverse colon demonstrates some air within in the previously seen diffuse wall thickening and pericolonic inflammatory change is reduced in the interval from the prior exam. The appendix is not well visualized. Few mildly prominent loops of small bowel are noted without definitive obstructive change. Edema within the mesentery is noted in these changes are likely reactive to the ascites identified. The stomach is well distended with fluid and contrast material. No extravasation is identified. The previously seen pneumatosis in the gastric wall has resolved as has  the degree of portal and mesenteric venous gas. Small hiatal hernia is again noted. Vascular/Lymphatic: Atherosclerotic calcifications of the abdominal aorta are noted. No sizable adenopathy is seen. Reproductive: Uterus and bilateral adnexa are unremarkable. Other: Mild to moderate ascites remains in the abdomen despite interval paracentesis yesterday. Some soft tissue changes are again noted in the omental fat anteriorly. This is similar to that seen on prior exam. Musculoskeletal: Degenerative changes of lumbar spine are noted. No acute bony abnormality is seen. Mild changes of anasarca are noted in the lateral abdominal walls. IMPRESSION: Persistent mild ascites with changes in the omental fat anteriorly again both infectious and neoplastic etiologies deserve consideration. Improved appearance of the transverse colon when compared with the prior exam. No evidence of perforation is noted. Resolution of previously seen gastric pneumatosis as well as portal and mesenteric venous gas. Cholelithiasis without complicating factors. New left pleural effusion and left lower lobe consolidation. Electronically Signed   By: Inez Catalina M.D.   On: 08/27/2022 17:14    LOS: 8 days   Author: Debbe Odea  08/28/2022 2:42 PM  To contact [facility]>   Check the care team in [facility] and look for the attending/consulting [place] provider listed  Log into [email or web address] and use [place]'s universal password   Go to> "[facility]"  and find provider  If you still have difficulty reaching the provider, please page the [facility] (Director on Call) for the Hospitalists listed on amion

## 2022-08-28 NOTE — Progress Notes (Signed)
Nutrition Follow-up  DOCUMENTATION CODES:   Obesity unspecified  INTERVENTION:  - If medically appropriate, would recommend starting TPN as patient now on day 8 with minimal/no nutrition.  - Patient at risk for refeeding syndrome due to prolonged inadequate intake.  - Monitor magnesium, potassium, and phosphorus BID for at least 3 days, MD to replete as needed. - Would recommend 167m thiamine x5 days if starting TPN.   NUTRITION DIAGNOSIS:   Inadequate oral intake related to inability to eat as evidenced by NPO status. *ongoing  GOAL:   Patient will meet greater than or equal to 90% of their needs *unmet   MONITOR:   Weight trends, Diet advancement  REASON FOR ASSESSMENT:   Malnutrition Screening Tool    ASSESSMENT:   72year old with past medical history significant for class II obesity, DM2, anxiety, depression, panic attack, history of CVA 2013, HTN, cholelithiasis, IBS, history of seizure who presented complaining of worsening abdominal pain, associated with diarrhea, decreased oral intake, vomiting.  2/11 Admit 2/15 Clear Liquids 2/17 NPO   Patient advanced to clear liquids on 2/15 but eventually developed nausea and vomiting so was made NPO again on 2/17. NGT has been recommended but patient refusing due to painful NG attempt in ED at beginning of admission.  Patient reports her nausea is somewhat better today. She is hopeful to have something to eat/drink soon as she is hungry. Notes she is passing some gas.  Patient is now on day 8 with minimal/no nutrition. Surgery note today indicates may need to consider TPN.  Weight up from admit but suspect edema may be contributing.   Medications reviewed and include: Simethicone, Zofran  Labs reviewed:  HA1C 6.1   Diet Order:   Diet Order             Diet NPO time specified Except for: Sips with Meds, Ice Chips  Diet effective now                   EDUCATION NEEDS:  Education needs have been  addressed  Skin:  Skin Assessment: Reviewed RN Assessment  Last BM:  2/11  Height:  Ht Readings from Last 1 Encounters:  08/20/22 5' 5"$  (1.651 m)   Weight:  Wt Readings from Last 1 Encounters:  08/28/22 112.5 kg   Ideal Body Weight:  56.82 kg  BMI:  Body mass index is 41.27 kg/m.  Estimated Nutritional Needs:  Kcal:  1800-2000 kcals Protein:  80-100 grams Fluid:  >/= 1.7L   ASamson FredericRD, LDN For contact information, refer to [facility]

## 2022-08-28 NOTE — Care Management Important Message (Signed)
Important Message  Patient Details IM Letter given. Name: [name] MRN: [id number] Date of Birth: 08/01/1950   Medicare Important Message Given:  Yes     Kerin Salen 08/28/2022, 2:38 PM

## 2022-08-28 NOTE — Progress Notes (Signed)
TPN Consult Note  A/P: Received consult for new TPN after 1230 deadline for bag to be able to be mixed to be administered tonight. Will order standard TPN labs for tomorrow AM and TPN will start tomorrow 2/20 at 1800. Order for PICC line to be placed by Md.   Adrian Saran, PharmD, BCPS Secure Chat if ?s 08/28/2022 2:50 PM

## 2022-08-28 NOTE — Progress Notes (Signed)
Subjective/Chief Complaint: Still with some diffuse abdominal tenderness, nausea.  Vomited overnight, 500cc   Objective: Vital signs in last 24 hours: Temp:  [98.3 F (36.8 C)-98.8 F (37.1 C)] 98.4 F (36.9 C) (02/19 0403) Pulse Rate:  [108-118] 112 (02/19 0403) Resp:  [14-18] 16 (02/19 0403) BP: (142-164)/(77-96) 159/89 (02/19 0403) SpO2:  [95 %-100 %] 98 % (02/19 0403) Weight:  [112.5 kg] 112.5 kg (02/19 0403) Last BM Date : 08/20/22  Intake/Output from previous day: 02/18 0701 - 02/19 0700 In: 967.3 [P.O.:250; I.V.:517.3; IV Piggyback:200] Out: 500 [Emesis/NG output:500] Intake/Output this shift: Total I/O In: 30 [P.O.:30] Out: -   Ab distended, mild diffuse tenderness, no peritonitis or guarding  Lab Results:  Recent Labs    08/27/22 0828 08/28/22 0316  WBC 13.3* 13.7*  HGB 10.8* 11.3*  HCT 34.0* 36.0  PLT 254 255   BMET Recent Labs    08/26/22 0319 08/27/22 0828  NA 132* 142  K 3.7 3.7  CL 99 103  CO2 26 26  GLUCOSE 118* 94  BUN 17 17  CREATININE 0.99 0.98  CALCIUM 7.8* 8.5*   PT/INR No results for input(s): "LABPROT", "INR" in the last 72 hours. ABG No results for input(s): "PHART", "HCO3" in the last 72 hours.  Invalid input(s): "PCO2", "PO2"  Studies/Results: CT ABDOMEN PELVIS W CONTRAST  Result Date: 08/27/2022 CLINICAL DATA:  Abdominal pain, history of recent paracentesis EXAM: CT ABDOMEN AND PELVIS WITH CONTRAST TECHNIQUE: Multidetector CT imaging of the abdomen and pelvis was performed using the standard protocol following bolus administration of intravenous contrast. RADIATION DOSE REDUCTION: This exam was performed according to the departmental dose-optimization program which includes automated exposure control, adjustment of the mA and/or kV according to patient size and/or use of iterative reconstruction technique. CONTRAST:  135m OMNIPAQUE IOHEXOL 300 MG/ML  SOLN COMPARISON:  08/20/2022 FINDINGS: Lower chest: New small left  pleural effusion is noted with left lower lobe consolidation. No parenchymal nodules are seen. Hepatobiliary: Previously seen portal venous air within the liver has resolved in the interval no focal mass lesion is seen. The gallbladder is well distended with multiple gallstones stable from the prior exam. No biliary ductal dilatation is seen. Pancreas: Unremarkable. No pancreatic ductal dilatation or surrounding inflammatory changes. Spleen: Normal in size without focal abnormality. Adrenals/Urinary Tract: Adrenal glands are within normal limits. Kidneys demonstrate a normal enhancement pattern bilaterally. No renal calculi or obstructive changes are seen. Normal excretion is seen bilaterally. The bladder is decompressed by Foley catheter. Stomach/Bowel: Colon is predominately decompressed. The transverse colon demonstrates some air within in the previously seen diffuse wall thickening and pericolonic inflammatory change is reduced in the interval from the prior exam. The appendix is not well visualized. Few mildly prominent loops of small bowel are noted without definitive obstructive change. Edema within the mesentery is noted in these changes are likely reactive to the ascites identified. The stomach is well distended with fluid and contrast material. No extravasation is identified. The previously seen pneumatosis in the gastric wall has resolved as has the degree of portal and mesenteric venous gas. Small hiatal hernia is again noted. Vascular/Lymphatic: Atherosclerotic calcifications of the abdominal aorta are noted. No sizable adenopathy is seen. Reproductive: Uterus and bilateral adnexa are unremarkable. Other: Mild to moderate ascites remains in the abdomen despite interval paracentesis yesterday. Some soft tissue changes are again noted in the omental fat anteriorly. This is similar to that seen on prior exam. Musculoskeletal: Degenerative changes of lumbar spine are noted. No  acute bony abnormality is  seen. Mild changes of anasarca are noted in the lateral abdominal walls. IMPRESSION: Persistent mild ascites with changes in the omental fat anteriorly again both infectious and neoplastic etiologies deserve consideration. Improved appearance of the transverse colon when compared with the prior exam. No evidence of perforation is noted. Resolution of previously seen gastric pneumatosis as well as portal and mesenteric venous gas. Cholelithiasis without complicating factors. New left pleural effusion and left lower lobe consolidation. Electronically Signed   By: Inez Catalina M.D.   On: 08/27/2022 17:14   US Paracentesis  Result Date: 08/27/2022 INDICATION: Recurrent ascites of unknown etiology. Request for therapeutic paracentesis. EXAM: ULTRASOUND GUIDED PARACENTESIS MEDICATIONS: 1% lidocaine 20 mL COMPLICATIONS: None immediate. PROCEDURE: Informed written consent was obtained from the patient after a discussion of the risks, benefits and alternatives to treatment. A timeout was performed prior to the initiation of the procedure. Initial ultrasound scanning demonstrates a large amount of ascites within the left lateral abdomen. The left lateral abdomen was prepped and draped in the usual sterile fashion. 1% lidocaine was used for local anesthesia. Following this, a 19 gauge, 10-cm, Yueh catheter was introduced. An ultrasound image was saved for documentation purposes. The paracentesis was performed. The catheter was removed and a dressing was applied. The patient tolerated the procedure well without immediate post procedural complication. FINDINGS: A total of approximately 2.2 L of clear yellow fluid was removed. IMPRESSION: Successful ultrasound-guided paracentesis yielding 2.2 liters of peritoneal fluid. Procedure performed by: Gareth Eagle, PA-C Electronically Signed   By: Albin Felling M.D.   On: 08/27/2022 11:34   DG Abd Portable 1V  Result Date: 08/26/2022 CLINICAL DATA:  Nausea vomiting EXAM: PORTABLE  ABDOMEN - 1 VIEW COMPARISON:  08/23/2022 FINDINGS: The bowel gas pattern is normal. No radio-opaque calculi or other significant radiographic abnormality are seen. IMPRESSION: Nonspecific bowel gas pattern. Electronically Signed   By: Sammie Bench M.D.   On: 08/26/2022 10:10    Anti-infectives: Anti-infectives (From admission, onward)    Start     Dose/Rate Route Frequency Ordered Stop   08/20/22 2000  piperacillin-tazobactam (ZOSYN) IVPB 3.375 g        3.375 g 12.5 mL/hr over 240 Minutes Intravenous Every 8 hours 08/20/22 1325 08/25/22 1918   08/20/22 1130  piperacillin-tazobactam (ZOSYN) IVPB 3.375 g        3.375 g 100 mL/hr over 30 Minutes Intravenous  Once 08/20/22 1126 08/20/22 1223       Assessment/Plan: Abdominal pain, pneumobilia, gastric pneumatosis, N/V/D - Reports her pain is still present but stable today. No peritonitis on exam. No indication for emergency surgery currently. not sure of source of all of this though.  -cytology pending.  Called lab today and they are running additional tests and these should be published today. -would be concerned about injury with dx lap if patient has carcinomatosis based on the appearance of her CT scan per MD.  Will await cytology and then determine a plan from there -npo.  Have recommended NGT again.  I will write for it and she can refuse if she would like, but her stomach is dilated on her CT scan and don't want to increase her risk of aspiration. - Cont abx therapy and BID PPI, not sure what infection we are treating.   FEN -NPO, would consider starting tpn, NGT if she agrees VTE - SCDs, ok for chemical prophylaxis from our standpoint ID - zosyn 2/11 >>  Foley - per primary  DM Hx of seizures HTN IBS Morbid obesity GAD AKI - resolved   I reviewed hospitalist notes, last 24 h vitals and pain scores, last 48 h intake and output, last 24 h labs and trends, and last 24 h imaging results. I reveiwed her xray from yesterday  and IR report of paracentesis.  Discussed with primary team   Henreitta Cea, PA-C 08/28/2022

## 2022-08-28 NOTE — Plan of Care (Signed)

## 2022-08-29 ENCOUNTER — Inpatient Hospital Stay (HOSPITAL_COMMUNITY): Payer: Medicare HMO

## 2022-08-29 DIAGNOSIS — R197 Diarrhea, unspecified: Secondary | ICD-10-CM | POA: Diagnosis not present

## 2022-08-29 DIAGNOSIS — R112 Nausea with vomiting, unspecified: Secondary | ICD-10-CM | POA: Diagnosis not present

## 2022-08-29 LAB — BLOOD GAS, ARTERIAL
Acid-Base Excess: 4.1 mmol/L — ABNORMAL HIGH (ref 0.0–2.0)
Bicarbonate: 28.5 mmol/L — ABNORMAL HIGH (ref 20.0–28.0)
O2 Content: 3 L/min
O2 Saturation: 99.4 %
Patient temperature: 36.7
pCO2 arterial: 40 mmHg (ref 32–48)
pH, Arterial: 7.46 — ABNORMAL HIGH (ref 7.35–7.45)
pO2, Arterial: 92 mmHg (ref 83–108)

## 2022-08-29 LAB — COMPREHENSIVE METABOLIC PANEL
ALT: 19 U/L (ref 0–44)
AST: 31 U/L (ref 15–41)
Albumin: 4.6 g/dL (ref 3.5–5.0)
Alkaline Phosphatase: 37 U/L — ABNORMAL LOW (ref 38–126)
Anion gap: 9 (ref 5–15)
BUN: 19 mg/dL (ref 8–23)
CO2: 26 mmol/L (ref 22–32)
Calcium: 8.4 mg/dL — ABNORMAL LOW (ref 8.9–10.3)
Chloride: 107 mmol/L (ref 98–111)
Creatinine, Ser: 0.94 mg/dL (ref 0.44–1.00)
GFR, Estimated: >60 mL/min (ref >60)
Glucose, Bld: 109 mg/dL — ABNORMAL HIGH (ref 70–99)
Potassium: 3.8 mmol/L (ref 3.5–5.1)
Sodium: 142 mmol/L (ref 135–145)
Total Bilirubin: 1.6 mg/dL — ABNORMAL HIGH (ref 0.3–1.2)
Total Protein: 6.3 g/dL — ABNORMAL LOW (ref 6.5–8.1)

## 2022-08-29 LAB — GLUCOSE, CAPILLARY
Glucose-Capillary: 108 mg/dL — ABNORMAL HIGH (ref 70–99)
Glucose-Capillary: 111 mg/dL — ABNORMAL HIGH (ref 70–99)
Glucose-Capillary: 117 mg/dL — ABNORMAL HIGH (ref 70–99)
Glucose-Capillary: 91 mg/dL (ref 70–99)
Glucose-Capillary: 95 mg/dL (ref 70–99)
Glucose-Capillary: 96 mg/dL (ref 70–99)

## 2022-08-29 LAB — MAGNESIUM: Magnesium: 2.2 mg/dL (ref 1.7–2.4)

## 2022-08-29 LAB — PHOSPHORUS: Phosphorus: 2.9 mg/dL (ref 2.5–4.6)

## 2022-08-29 LAB — CYTOLOGY - NON PAP

## 2022-08-29 LAB — TRIGLYCERIDES: Triglycerides: 102 mg/dL (ref <150)

## 2022-08-29 MED ORDER — SODIUM CHLORIDE 0.9 % IV SOLN: INTRAVENOUS | Status: AC

## 2022-08-29 MED ORDER — SODIUM CHLORIDE 0.9% FLUSH: 10.0000 mL | INTRAVENOUS | Status: DC | PRN

## 2022-08-29 MED ORDER — PHENOL 1.4 % MT LIQD: 1.0000 | OROMUCOSAL | Status: DC | PRN

## 2022-08-29 MED ORDER — POTASSIUM CHLORIDE 10 MEQ/50ML IV SOLN
10.0000 meq | INTRAVENOUS | Status: AC
  Administered 2022-08-29 (×2): 10 meq via INTRAVENOUS
  Filled 2022-08-29 (×2): qty 50

## 2022-08-29 MED ORDER — TRACE MINERALS CU-MN-SE-ZN 300-55-60-3000 MCG/ML IV SOLN
INTRAVENOUS | Status: AC
  Filled 2022-08-29: qty 248

## 2022-08-29 MED ORDER — BENZOCAINE 20 % MT AERO
INHALATION_SPRAY | Freq: Once | OROMUCOSAL | Status: DC
  Filled 2022-08-29: qty 57

## 2022-08-29 MED ORDER — DIAZEPAM 5 MG/ML IJ SOLN
5.0000 mg | INTRAMUSCULAR | Status: DC | PRN
  Administered 2022-08-29 – 2022-09-04 (×5): 5 mg via INTRAVENOUS
  Filled 2022-08-29 (×7): qty 2

## 2022-08-29 NOTE — Progress Notes (Signed)
Subjective/Chief Complaint: Audible wheezing this morning with increasing SOB.  Still with nausea and vomited some yesterday.  Finally agreeable to NGT.   Objective: Vital signs in last 24 hours: Temp:  [98 F (36.7 C)-98.4 F (36.9 C)] 98 F (36.7 C) (02/20 0453) Pulse Rate:  [110-117] 112 (02/20 0453) Resp:  [17-19] 19 (02/20 0453) BP: (159-185)/(86-119) 185/119 (02/20 0453) SpO2:  [92 %-97 %] 97 % (02/20 0453) Weight:  [105.8 kg] 105.8 kg (02/20 0500) Last BM Date : 08/20/22  Intake/Output from previous day: 02/19 0701 - 02/20 0700 In: 533.4 [P.O.:110; I.V.:323.4; IV Piggyback:100] Out: 1025 [Urine:1025] Intake/Output this shift: No intake/output data recorded.  Ab distended, tight, mild diffuse tenderness, no peritonitis or guarding.  NGT placed at bedside by myself, 56F in right nare.  500cc of immediate bilious output  Lab Results:  Recent Labs    08/27/22 0828 08/28/22 0316  WBC 13.3* 13.7*  HGB 10.8* 11.3*  HCT 34.0* 36.0  PLT 254 255   BMET Recent Labs    08/28/22 1614 08/29/22 0331  NA 145 142  K 4.2 3.8  CL 105 107  CO2 28 26  GLUCOSE 98 109*  BUN 16 19  CREATININE 0.97 0.94  CALCIUM 8.5* 8.4*   PT/INR No results for input(s): "LABPROT", "INR" in the last 72 hours. ABG Recent Labs    08/29/22 0950  PHART 7.46*  HCO3 28.5*    Studies/Results: [place] EKG SITE RITE  Result Date: 08/28/2022 If Site Rite image not attached, placement could not be confirmed due to current cardiac rhythm.  CT ABDOMEN PELVIS W CONTRAST  Result Date: 08/27/2022 CLINICAL DATA:  Abdominal pain, history of recent paracentesis EXAM: CT ABDOMEN AND PELVIS WITH CONTRAST TECHNIQUE: Multidetector CT imaging of the abdomen and pelvis was performed using the standard protocol following bolus administration of intravenous contrast. RADIATION DOSE REDUCTION: This exam was performed according to the departmental dose-optimization program which includes automated exposure  control, adjustment of the mA and/or kV according to patient size and/or use of iterative reconstruction technique. CONTRAST:  139m OMNIPAQUE IOHEXOL 300 MG/ML  SOLN COMPARISON:  08/20/2022 FINDINGS: Lower chest: New small left pleural effusion is noted with left lower lobe consolidation. No parenchymal nodules are seen. Hepatobiliary: Previously seen portal venous air within the liver has resolved in the interval no focal mass lesion is seen. The gallbladder is well distended with multiple gallstones stable from the prior exam. No biliary ductal dilatation is seen. Pancreas: Unremarkable. No pancreatic ductal dilatation or surrounding inflammatory changes. Spleen: Normal in size without focal abnormality. Adrenals/Urinary Tract: Adrenal glands are within normal limits. Kidneys demonstrate a normal enhancement pattern bilaterally. No renal calculi or obstructive changes are seen. Normal excretion is seen bilaterally. The bladder is decompressed by Foley catheter. Stomach/Bowel: Colon is predominately decompressed. The transverse colon demonstrates some air within in the previously seen diffuse wall thickening and pericolonic inflammatory change is reduced in the interval from the prior exam. The appendix is not well visualized. Few mildly prominent loops of small bowel are noted without definitive obstructive change. Edema within the mesentery is noted in these changes are likely reactive to the ascites identified. The stomach is well distended with fluid and contrast material. No extravasation is identified. The previously seen pneumatosis in the gastric wall has resolved as has the degree of portal and mesenteric venous gas. Small hiatal hernia is again noted. Vascular/Lymphatic: Atherosclerotic calcifications of the abdominal aorta are noted. No sizable adenopathy is seen. Reproductive: Uterus and bilateral  adnexa are unremarkable. Other: Mild to moderate ascites remains in the abdomen despite interval  paracentesis yesterday. Some soft tissue changes are again noted in the omental fat anteriorly. This is similar to that seen on prior exam. Musculoskeletal: Degenerative changes of lumbar spine are noted. No acute bony abnormality is seen. Mild changes of anasarca are noted in the lateral abdominal walls. IMPRESSION: Persistent mild ascites with changes in the omental fat anteriorly again both infectious and neoplastic etiologies deserve consideration. Improved appearance of the transverse colon when compared with the prior exam. No evidence of perforation is noted. Resolution of previously seen gastric pneumatosis as well as portal and mesenteric venous gas. Cholelithiasis without complicating factors. New left pleural effusion and left lower lobe consolidation. Electronically Signed   By: Inez Catalina M.D.   On: 08/27/2022 17:14    Anti-infectives: Anti-infectives (From admission, onward)    Start     Dose/Rate Route Frequency Ordered Stop   08/20/22 2000  piperacillin-tazobactam (ZOSYN) IVPB 3.375 g        3.375 g 12.5 mL/hr over 240 Minutes Intravenous Every 8 hours 08/20/22 1325 08/25/22 1918   08/20/22 1130  piperacillin-tazobactam (ZOSYN) IVPB 3.375 g        3.375 g 100 mL/hr over 30 Minutes Intravenous  Once 08/20/22 1126 08/20/22 1223       Assessment/Plan: Abdominal pain, pneumobilia, gastric pneumatosis, N/V/D - Reports her pain is improved. No peritonitis on exam. No indication for emergency surgery currently. not sure of source of all of this though.  -cytology pending, but after discussion yesterday this is likely negative. -CA19-9, CEA, CA-125 pending -IR consult for omental biopsy.  Suspect she has a malignancy until proven otherwise. -cont NPO/NGT for now.  Stat placement film for NGT process. -has completed at least 7 days of abx therapy.  Don't think she needs any further.  D/w primary service today   FEN -NPO, NGT/TPN VTE - SCDs, ok for chemical prophylaxis from our  standpoint ID - zosyn 2/11 >>  Foley - per primary    DM Hx of seizures HTN IBS Morbid obesity GAD AKI - resolved   I reviewed hospitalist notes, last 24 h vitals and pain scores, last 48 h intake and output, last 24 h labs and trends, and last 24 h imaging results.  Discussed with primary team.   Henreitta Cea, PA-C 08/29/2022

## 2022-08-29 NOTE — Plan of Care (Signed)
  Problem: Health Behavior/Discharge Planning: Goal: Ability to manage health-related needs will improve Outcome: Progressing   Problem: Pain Managment: Goal: General experience of comfort will improve Outcome: Progressing   Problem: Safety: Goal: Ability to remain free from injury will improve Outcome: Progressing   

## 2022-08-29 NOTE — Consult Note (Signed)
Referring Provider:  [place] Primary Care Physician:  London Pepper, MD Primary Gastroenterologist: Sadie [facility] PCP  Reason for Consultation: Abdominal distention and ascites  HPI: [name] is a 72 y.o. female with past medical history of CVA, diabetes, anxiety, depression, history of seizure disorder and IBS presented to the hospital with abdominal pain.  Initial blood work showed significant leukocytosis with white count of 20.3, and elevated LFTs with AST 55, ALT 53, creatinine 2.28, mild elevated lipase at 55.  Initial CT abdomen pelvis without contrast on August 20, 2022 showed portal venous and mesenteric venous gas as well as mild pneumatosis in the gastric wall without evidence of gastric wall thickening.  Also showed moderate wall thickening of the transverse colon.  Also showed moderate ascites and omental soft tissue stranding.  Seen by surgical team.  Recommended NG decompression, IV antibiotics and conservative management. Underwent ultrasound-guided paracentesis with removal of 5.5 L of fluid on 13th and removal of 2.2 L of fluid on 18th.  Initial fluid analysis on August 22, 2022 were positive for SBP.  SAAG 0.3.  Ascites is most likely not due to portal hypertension.  Elevated LDH.  Cytology pending.  Repeat CT abdomen pelvis with IV contrast on August 27, 2022 showed resolution of gastric pneumatosis as well as portal and mesenteric venous gas.  Improved appearance of the transverse colon.  But showed persistent ascites and omental fat stranding.   GI is consulted for further evaluation for possible EGD.   Past Medical History:  Diagnosis Date   Diabetes mellitus type 2, noninsulin dependent ([place]) 08/20/2022   Generalized anxiety disorder 08/20/2022   History of panic attacks 08/20/2022   History of seizures 2013 08/20/2022   Hypertension    Hypertensive urgency in 2012 08/20/2022   Irritable bowel syndrome (IBS) 08/20/2022   Melanoma of back ([place]) 1997   Severe obesity  (BMI 35.0-35.9 with comorbidity) ([place]) 08/20/2022    Past Surgical History:  Procedure Laterality Date   CATARACT EXTRACTION  2015    Prior to Admission medications   Medication Sig Start Date End Date Taking? Authorizing Provider  acetaminophen (TYLENOL) 500 MG tablet Take 1,000 mg by mouth every 6 (six) hours as needed for mild pain.   Yes [provider]  ALPRAZolam (XANAX) 0.25 MG tablet Take 0.25 mg by mouth 3 (three) times daily as needed. For anxiety    Yes [provider]  citalopram (CELEXA) 20 MG tablet Take 20 mg by mouth daily.     Yes [provider]  dextromethorphan (DELSYM) 30 MG/5ML liquid Take 60 mg by mouth at bedtime as needed for cough. For cough   Yes [provider]  dicyclomine (BENTYL) 20 MG tablet Take 20 mg by mouth 2 (two) times daily as needed for spasms.   Yes [provider]  famotidine (PEPCID AC MAXIMUM STRENGTH) 20 MG tablet Take 20 mg by mouth daily as needed for heartburn or indigestion.   Yes [provider]  hydrochlorothiazide (HYDRODIURIL) 12.5 MG tablet Take 12.5 mg by mouth daily. 07/28/22  Yes [provider]  hydroxypropyl methylcellulose (ISOPTO TEARS) 2.5 % ophthalmic solution Place 1 drop into both eyes 3 (three) times daily as needed. For dry eyes    Yes [provider]  loratadine (CLARITIN) 10 MG tablet Take 10 mg by mouth daily.     Yes [provider]  losartan (COZAAR) 100 MG tablet Take 100 mg by mouth daily. 07/28/22  Yes [provider]  metoprolol (LOPRESSOR) 50  MG tablet Take 50 mg by mouth 2 (two) times daily.     Yes [provider]  oxymetazoline (AFRIN) 0.05 % nasal spray Place 1 spray into both nostrils daily as needed for congestion.   Yes [provider]  pravastatin (PRAVACHOL) 10 MG tablet Take 10 mg by mouth at bedtime. 07/19/22  Yes [provider]    Scheduled Meds:  Benzocaine   Mouth/Throat Once   bisacodyl   10 mg Rectal Daily   Chlorhexidine Gluconate Cloth  6 each Topical Daily   insulin aspart  0-9 Units Subcutaneous Q4H   lip balm   Topical BID   metoCLOPramide (REGLAN) injection  10 mg Intravenous Q6H   metoprolol tartrate  5 mg Intravenous Q6H   pantoprazole (PROTONIX) IV  40 mg Intravenous Q12H   simethicone  80 mg Oral QID   Continuous Infusions:  sodium chloride Stopped (08/25/22 0520)   sodium chloride     sodium chloride     albumin human 25 g (08/29/22 0703)   ondansetron (ZOFRAN) IV     potassium chloride     TPN ADULT (ION)     PRN Meds:.sodium chloride, acetaminophen **OR** acetaminophen, alum & mag hydroxide-simeth, diazepam, HYDROcodone-acetaminophen, HYDROmorphone (DILAUDID) injection, magic mouthwash, menthol-cetylpyridinium, ondansetron (ZOFRAN) IV **OR** ondansetron (ZOFRAN) IV, mouth rinse, phenol, prochlorperazine, simethicone  Allergies as of 08/20/2022 - Review Complete 08/20/2022  Allergen Reaction Noted   Nsaids Other (See Comments) 08/20/2022    No family history on file.  Social History   Socioeconomic History   Marital status: Married    Spouse name: Not on file   Number of children: Not on file   Years of education: Not on file   Highest education level: Not on file  Occupational History   Not on file  Tobacco Use   Smoking status: Former   Smokeless tobacco: Not on file  Vaping Use   Vaping Use: Never used  Substance and Sexual Activity   Alcohol use: Not on file   Drug use: Not on file   Sexual activity: Not on file  Other Topics Concern   Not on file  Social History Narrative   Not on file   Social Determinants of Health   Financial Resource Strain: Not on file  Food Insecurity: No Food Insecurity (08/21/2022)   Hunger Vital Sign    Worried About Running Out of Food in the Last Year: Never true    Ran Out of Food in the Last Year: Never true  Transportation Needs: No Transportation Needs (08/21/2022)   PRAPARE - Armed forces logistics/support/administrative officer (Medical): No    Lack of Transportation (Non-Medical): No  Physical Activity: Not on file  Stress: Not on file  Social Connections: Not on file  Intimate Partner Violence: Not At Risk (08/21/2022)   Humiliation, Afraid, Rape, and Kick questionnaire    Fear of Current or Ex-Partner: No    Emotionally Abused: No    Physically Abused: No    Sexually Abused: No    Review of Systems: All negative except as stated above in HPI.  Physical Exam: Vital signs: Vitals:   08/29/22 0030 08/29/22 0453  BP: (!) 164/96 (!) 185/119  Pulse: (!) 110 (!) 112  Resp: 17 19  Temp: 98.2 F (36.8 C) 98 F (36.7 C)  SpO2: 97% 97%   Last BM Date : 08/20/22 General:   Alert, resting in bed.  NG suction in place. Lungs: No visible respiratory distress Heart:  Tachycardia Abdomen: Abdomen is moderately distended and somewhat tense, bowel sounds present, no peritoneal signs. Alert and oriented x 3 Mood and affect normal Rectal:  Deferred  GI:  Lab Results: Recent Labs    08/27/22 0828 08/28/22 0316  WBC 13.3* 13.7*  HGB 10.8* 11.3*  HCT 34.0* 36.0  PLT 254 255   BMET Recent Labs    08/27/22 0828 08/28/22 1614 08/29/22 0331  NA 142 145 142  K 3.7 4.2 3.8  CL 103 105 107  CO2 26 28 26  $ GLUCOSE 94 98 109*  BUN 17 16 19  $ CREATININE 0.98 0.97 0.94  CALCIUM 8.5* 8.5* 8.4*   LFT Recent Labs    08/29/22 0331  PROT 6.3*  ALBUMIN 4.6  AST 31  ALT 19  ALKPHOS 37*  BILITOT 1.6*   PT/INR No results for input(s): "LABPROT", "INR" in the last 72 hours.   Studies/Results: [place] EKG SITE RITE  Result Date: 08/28/2022 If Site Rite image not attached, placement could not be confirmed due to current cardiac rhythm.  CT ABDOMEN PELVIS W CONTRAST  Result Date: 08/27/2022 CLINICAL DATA:  Abdominal pain, history of recent paracentesis EXAM: CT ABDOMEN AND PELVIS WITH CONTRAST TECHNIQUE: Multidetector CT imaging of the abdomen and pelvis was performed using the standard  protocol following bolus administration of intravenous contrast. RADIATION DOSE REDUCTION: This exam was performed according to the departmental dose-optimization program which includes automated exposure control, adjustment of the mA and/or kV according to patient size and/or use of iterative reconstruction technique. CONTRAST:  174m OMNIPAQUE IOHEXOL 300 MG/ML  SOLN COMPARISON:  08/20/2022 FINDINGS: Lower chest: New small left pleural effusion is noted with left lower lobe consolidation. No parenchymal nodules are seen. Hepatobiliary: Previously seen portal venous air within the liver has resolved in the interval no focal mass lesion is seen. The gallbladder is well distended with multiple gallstones stable from the prior exam. No biliary ductal dilatation is seen. Pancreas: Unremarkable. No pancreatic ductal dilatation or surrounding inflammatory changes. Spleen: Normal in size without focal abnormality. Adrenals/Urinary Tract: Adrenal glands are within normal limits. Kidneys demonstrate a normal enhancement pattern bilaterally. No renal calculi or obstructive changes are seen. Normal excretion is seen bilaterally. The bladder is decompressed by Foley catheter. Stomach/Bowel: Colon is predominately decompressed. The transverse colon demonstrates some air within in the previously seen diffuse wall thickening and pericolonic inflammatory change is reduced in the interval from the prior exam. The appendix is not well visualized. Few mildly prominent loops of small bowel are noted without definitive obstructive change. Edema within the mesentery is noted in these changes are likely reactive to the ascites identified. The stomach is well distended with fluid and contrast material. No extravasation is identified. The previously seen pneumatosis in the gastric wall has resolved as has the degree of portal and mesenteric venous gas. Small hiatal hernia is again noted. Vascular/Lymphatic: Atherosclerotic calcifications  of the abdominal aorta are noted. No sizable adenopathy is seen. Reproductive: Uterus and bilateral adnexa are unremarkable. Other: Mild to moderate ascites remains in the abdomen despite interval paracentesis yesterday. Some soft tissue changes are again noted in the omental fat anteriorly. This is similar to that seen on prior exam. Musculoskeletal: Degenerative changes of lumbar spine are noted. No acute bony abnormality is seen. Mild changes of anasarca are noted in the lateral abdominal walls. IMPRESSION: Persistent mild ascites with changes in the omental fat anteriorly again both infectious and neoplastic etiologies deserve consideration. Improved appearance of the transverse colon when compared  with the prior exam. No evidence of perforation is noted. Resolution of previously seen gastric pneumatosis as well as portal and mesenteric venous gas. Cholelithiasis without complicating factors. New left pleural effusion and left lower lobe consolidation. Electronically Signed   By: Inez Catalina M.D.   On: 08/27/2022 17:14    Impression/Plan: -Abdominal pain ascites with initial CT scan concerning for gastric pneumatosis as well as portal venous gas.  Repeat CT with IV contrast showed resolution of those findings. -Ascites.  SAG 0.3.  Less likely to be from portal hypertension.  Fluid analysis positive for SBP.  Elevated LDH.  Normal LFTs now. -Omental thickening.  Concerning for malignancy.  Recommendations ----------------------- -Continue NG suction for now.  Patient with recent gastric pneumatosis.  EGD may increase risk of gastric perforation.  Also air insufflation during EGD may cause worsening of abdominal distention. -She may benefit from upper GI small bowel follow-through to rule out any upper GI/ small bowel malignancy. -Follow ascitic fluid cytology. -Follow tumor markers CEA, CA125 and CA 19-9 -Management plan discussed with Dr. Wynelle Cleveland.   LOS: 9 days   Otis Brace  MD,  FACP 08/29/2022, 10:08 AM  Contact #  [phone]

## 2022-08-29 NOTE — Progress Notes (Signed)
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  Chief complaint of progressively worsening abdominal pain that has been ongoing for the past three weeks. In addition to the pain, she reports several accompanying symptoms that have manifested during this period, including a decrease in appetite, unintentional weight loss (the exact quantity of which remains unknown), episodes of diarrhea, and repeated instances of vomiting. The patient has specified that she has not engaged in any recent travel nor has she made any changes to her diet that could potentially explain her symptoms.  In ED : WBC 20.3, LA 2.2  CT > Possible infectious or ischemic colitis indicated by colonic wall thickening and symptomatology. ? Peritoneal carcinomatosis due to the presence of ascites and omental stranding. Mesenteric venous gas and pneumatosis: These findings necessitate investigation for underlying gastrointestinal pathology.  Started on Zosyn, IVF- NG attempted but was painful and was not successful- she subsequently refused reattempts at NG Clinically not improving. Para has been done twice. Omental biopsy ordered. GI consulted for EGD. NG placed today when patient finally agreed to it.   Subjective:  Continues to have abdominal pain, very short of breath this AM. Still not wanting an NG. I have spoken with her sister about the necessity of it.She has convinced the patient. NG placed by Saverio Danker. Patient feels much better immediately afterwards.   Assessment and Plan:  Diffuse abdominal pain with ascites, lactic acidosis,  pneumobilia, gastric pneumatosis,  elevated LFTs, leukocytosis elevated lactic acid. 2/13> Paracentesis ~ 5 L of fluid: Total Nucleated Cell Count, Fluid: 1,399 Lymphs (Lymphocytes), Fluid: 19 Eos (Eosinophils), Fluid: 0 Neutrophil Count, Fluid: 63 Monocyte-Macrophage-Serous Fluid: 18 No organisms seen on gram stain- culture negative - Appreciate general surgery - no need for surgery - PRN IV Dilaudid and Hydrocodone  - Zosyn 2/11- 2/16- stopped as she was not improving with it - 21/7 paracentesis again for therapeutic reasons- 2 L removed- pain a little better - also made NPO from clears on 2/17 due to vomiting- Albumin TID -  2/18> repeat CT of abdomen/pelvis > pneumatosis improving but lots of gas in the stomach and still has mild- mod ascites - gen surgery has ordered an NG but she is still refusing since admission - 2/19> have ordered TPN, PICC, Reglan QID and Dulcolax suppository dailyas she still has no bowel sounds. GI consulted Youth worker) - 2/20 > resp distress-  NG in as she finally agreed after I spoke with her sister- Large amount of green fluid- she feels better follow up on NG output, EGD results and request for omental biopsy today to r/o cancer - TPN order received by pharmacy and will be started tonight at 25 mg/hr to prevent refeeding syndrome   SIRS with hypotension-resolved Hypertension now - oral lopressor on hold - IV lopressor until able to tolerate orals  Macrocytosis, B12 deficiency -MCV 101.9- checked folate and B12 levels- folate 7.2- B12 211 - given 1 gm B12 injection 2/16  Acute kidney injury on top of chronic kidney disease stage IIIa - prerenal  - baseline creatinine level recorded as 1.1 mg/dL -  elevated at 2.2 mg/d in ED - improved to  baseline with slow IVF - holding Losartan and HCTZ   Poor urine output 2/15 - cont albumin Q 8 hrs which has been helping a great deal to promote urine output and prevent anasarca- can continue until TPN therapeutic and then re-assess for need -    Elevated liver enzymes and hyperbilirubinemia: - likely secondary to hypoperfusion - AST/ALT normalized - T bili slightly elevated still at 1.6  Type 2 diabetes mellitus  - sugars have been near normal & not receiving any insulin  Anxiety - Valium PRN IV in place of home Xanax  Previous cerebrovascular accident in 2012   Estimated BMI of 36.61 kg/m, indicating class 2 obesity which requires lifestyle modification for long-term management.    Code Status: Full Code Consultants: general surgery Level of Care: Level of care: Med-Surg Total time on patient care: 35 min DVT prophylaxis:  SCDs Start: 08/20/22 1402    Objective:   Vitals:   08/28/22 2039 08/29/22 0030 08/29/22 0453 08/29/22 0500  BP: (!) 164/88 (!) 164/96 (!) 185/119   Pulse: (!) 110 (!) 110 (!) 112   Resp: 17 17 19   $ Temp: 98.4 F (36.9 C) 98.2 F (36.8 C) 98 F (36.7 C)   TempSrc: Oral Oral    SpO2: 95% 97% 97%   Weight:    105.8 kg  Height:       Filed Weights   08/27/22 0500 08/28/22 0403 08/29/22 0500  Weight: 111 kg 112.5 kg 105.8 kg   Exam: General exam: Appears very uncomfortable HEENT: oral mucosa moist Respiratory system: resp distress - RR > 25  Cardiovascular system: S1 & S2 heard  Gastrointestinal system: Abdomen soft,  tender, severely distended. No bowel sounds   Extremities: No cyanosis, clubbing or edema Psychiatry:  anxious     CBC: Recent Labs  Lab 08/24/22 0308 08/25/22 0413 08/26/22 0319 08/27/22 0828 08/28/22 0316  WBC 11.8* 11.8* 11.4* 13.3* 13.7*  HGB 13.4 12.8 11.7* 10.8* 11.3*  HCT 43.9 40.1 36.9 34.0* 36.0  MCV 101.9* 100.3* 100.0 101.2* 100.6*  PLT 299 280 330 254 [id number]    Basic Metabolic Panel: Recent Labs  Lab 08/23/22 0540 08/24/22 0308 08/25/22 0413 08/26/22 0319 08/27/22 0828 08/28/22 1614 08/29/22 0331  NA 138   < > 137 132* 142 145 142  K 4.1   < > 4.0 3.7 3.7 4.2 3.8  CL 103   < > 100 99 103 105 107  CO2 26   < > 27 26 26 28 26  $ GLUCOSE  110*   < > 102* 118* 94 98 109*  BUN 23   < > 16 17 17 16 19  $ CREATININE 1.14*   < > 1.16* 0.99 0.98 0.97 0.94  CALCIUM 8.1*   < > 8.0* 7.8* 8.5* 8.5* 8.4*  MG 1.7  --   --   --   --   --  2.2  PHOS  --   --   --   --   --   --  2.9   < > = values in this interval not displayed.    GFR: Estimated Creatinine Clearance: 65.3 mL/min (by C-G formula based on SCr of 0.94 mg/dL).  Scheduled Meds:  Benzocaine   Mouth/Throat Once   bisacodyl  10 mg Rectal Daily   Chlorhexidine Gluconate Cloth  6 each Topical Daily   insulin aspart  0-9 Units Subcutaneous Q4H   lip balm   Topical BID   metoCLOPramide (REGLAN) injection  10 mg  Intravenous Q6H   metoprolol tartrate  5 mg Intravenous Q6H   pantoprazole (PROTONIX) IV  40 mg Intravenous Q12H   simethicone  80 mg Oral QID   Continuous Infusions:  sodium chloride Stopped (08/25/22 0520)   sodium chloride     sodium chloride     albumin human 25 g (08/29/22 0703)   ondansetron (ZOFRAN) IV     potassium chloride     TPN ADULT (ION)     Imaging and lab data was personally reviewed [place] EKG SITE RITE  Result Date: 08/28/2022 If Site Rite image not attached, placement could not be confirmed due to current cardiac rhythm.  CT ABDOMEN PELVIS W CONTRAST  Result Date: 08/27/2022 CLINICAL DATA:  Abdominal pain, history of recent paracentesis EXAM: CT ABDOMEN AND PELVIS WITH CONTRAST TECHNIQUE: Multidetector CT imaging of the abdomen and pelvis was performed using the standard protocol following bolus administration of intravenous contrast. RADIATION DOSE REDUCTION: This exam was performed according to the departmental dose-optimization program which includes automated exposure control, adjustment of the mA and/or kV according to patient size and/or use of iterative reconstruction technique. CONTRAST:  115m OMNIPAQUE IOHEXOL 300 MG/ML  SOLN COMPARISON:  08/20/2022 FINDINGS: Lower chest: New small left pleural effusion is noted with left lower lobe  consolidation. No parenchymal nodules are seen. Hepatobiliary: Previously seen portal venous air within the liver has resolved in the interval no focal mass lesion is seen. The gallbladder is well distended with multiple gallstones stable from the prior exam. No biliary ductal dilatation is seen. Pancreas: Unremarkable. No pancreatic ductal dilatation or surrounding inflammatory changes. Spleen: Normal in size without focal abnormality. Adrenals/Urinary Tract: Adrenal glands are within normal limits. Kidneys demonstrate a normal enhancement pattern bilaterally. No renal calculi or obstructive changes are seen. Normal excretion is seen bilaterally. The bladder is decompressed by Foley catheter. Stomach/Bowel: Colon is predominately decompressed. The transverse colon demonstrates some air within in the previously seen diffuse wall thickening and pericolonic inflammatory change is reduced in the interval from the prior exam. The appendix is not well visualized. Few mildly prominent loops of small bowel are noted without definitive obstructive change. Edema within the mesentery is noted in these changes are likely reactive to the ascites identified. The stomach is well distended with fluid and contrast material. No extravasation is identified. The previously seen pneumatosis in the gastric wall has resolved as has the degree of portal and mesenteric venous gas. Small hiatal hernia is again noted. Vascular/Lymphatic: Atherosclerotic calcifications of the abdominal aorta are noted. No sizable adenopathy is seen. Reproductive: Uterus and bilateral adnexa are unremarkable. Other: Mild to moderate ascites remains in the abdomen despite interval paracentesis yesterday. Some soft tissue changes are again noted in the omental fat anteriorly. This is similar to that seen on prior exam. Musculoskeletal: Degenerative changes of lumbar spine are noted. No acute bony abnormality is seen. Mild changes of anasarca are noted in the  lateral abdominal walls. IMPRESSION: Persistent mild ascites with changes in the omental fat anteriorly again both infectious and neoplastic etiologies deserve consideration. Improved appearance of the transverse colon when compared with the prior exam. No evidence of perforation is noted. Resolution of previously seen gastric pneumatosis as well as portal and mesenteric venous gas. Cholelithiasis without complicating factors. New left pleural effusion and left lower lobe consolidation. Electronically Signed   By: MInez CatalinaM.D.   On: 08/27/2022 17:14    LOS: 9 days   Author: SDebbe Odea 08/29/2022 10:36 AM  To  contact [facility]>   Check the care team in [facility] and look for the attending/consulting [facility] provider listed  Log into [email or web address] and use [place]'s universal password   Go to> "[facility]"  and find provider  If you still have difficulty reaching the provider, please page the [facility] (Director on Call) for the Hospitalists listed on amion

## 2022-08-29 NOTE — Progress Notes (Signed)
Peripherally Inserted Central Catheter Placement  The IV Nurse has discussed with the patient and/or persons authorized to consent for the patient, the purpose of this procedure and the potential benefits and risks involved with this procedure.  The benefits include less needle sticks, lab draws from the catheter, and the patient may be discharged home with the catheter. Risks include, but not limited to, infection, bleeding, blood clot (thrombus formation), and puncture of an artery; nerve damage and irregular heartbeat and possibility to perform a PICC exchange if needed/ordered by physician.  Alternatives to this procedure were also discussed.  Bard Power PICC patient education guide, fact sheet on infection prevention and patient information card has been provided to patient /or left at bedside.    PICC Placement Documentation  PICC Double Lumen 0000000 Right Basilic 41 cm 0 cm (Active)  Indication for Insertion or Continuance of Line Administration of hyperosmolar/irritating solutions (i.e. TPN, Vancomycin, etc.) 08/29/22 1110  Exposed Catheter (cm) 0 cm 08/29/22 1110  Site Assessment Clean, Dry, Intact 08/29/22 1110  Lumen #1 Status Flushed;Saline locked;Blood return noted 08/29/22 1110  Lumen #2 Status Flushed;Saline locked;Blood return noted 08/29/22 1110  Dressing Type Transparent;Securing device 08/29/22 1110  Dressing Status Antimicrobial disc in place 08/29/22 1110  Dressing Intervention New dressing;Other (Comment) 08/29/22 1110  Dressing Change Due 09/05/22 08/29/22 1110       [name] 08/29/2022, 11:12 AM

## 2022-08-29 NOTE — Progress Notes (Signed)
PHARMACY - TOTAL PARENTERAL NUTRITION CONSULT NOTE   Indication:  Intolerance to enteral feeding  Patient Measurements: Height: 5' 5"$  (165.1 cm) Weight: 105.8 kg (233 lb 4 oz) IBW/kg (Calculated) : 57 TPN AdjBW (KG): 67.7 Body mass index is 38.81 kg/m.  Assessment:  Pt is a 69 yoF admitted on 2/11 with abdominal pain, N/V ongoing for several weeks. Imaging revealed gastric pneumatosis, penumobilia - no indication for surgery at this time. Pt has had minimal PO intake > 1 week. Pharmacy consulted to initiate TPN.  Glucose / Insulin: T2DM. CBG goal < 150 -CBG range: 77-111. sSSI q4h - none required/24 hrs Electrolytes: All lytes WNL. K (3.8) on lower end of normal. Alb WNL, Ca = 8.4 Renal: SCr, BUN WNL Hepatic: T.bili slightly elevated. Alk Phos, LFTs WNL. TG, Alb WNL (note: pt receiving albumin TID) Intake / Output; MIVF: UOP: 1025 mL. mIVF: NS @ 50 mL/hr GI Imaging: -2/11 CT Abd: Portal vein and mesenteric venous gas. Mild pneumatosis in the gastric wall without evidence of gastric wall thickening. Moderate wall thickening involving transverse colon (differential includes infectious or ischemic colitis or neoplasm). Moderate ascites, cholethiasis.  -2/18 CT Abd: Persistent mild ascites (infectious or neoplastic etiology?). Improved appearance of transverse colon, no evidence of perforation. Resolution of gastric pneumatosis as well as portal and mesenteric venous gas. GI Surgeries / Procedures:   Central access: PICC line ordered 2/19, has not yet been placed TPN start date: 2/20 pending PICC placement  Nutritional Goals: Goal TPN rate is 55 mL/hr (provides 82 g of protein and 1805 kcals per day)  Discussed with MD: Using Clinisol 15% to concentrate TPN to be able to meet nutritional needs with minimal volume.   RD Assessment: Estimated Needs Total Energy Estimated Needs: 1800-2000 kcals Total Protein Estimated Needs: 80-100 grams Total Fluid Estimated Needs: >/= 1.7L  Current  Nutrition:  NPO except for sips with meds TPN  Plan:  Now:  KCl 10 mEq/50 mL x 2 doses once central line placed Continue NS @ 50 mL/hr  At 1800: Start TPN at 25 mL/hr at 1800 Will provide 37 g protein, 132 g dextrose, 819 kcal Electrolytes in TPN: Standard Na 574mq/L, K 544m/L, Ca 74m28mL, Mg 74mE77m, and Phos 174mm38m.  Cl:Ac 1:1 Add standard MVI and trace elements to TPN.  Thiamine in TPN x5 days (2/20 > 2/24) Continue Sensitive q4h SSI and adjust as needed  Reduce MIVF to 25 mL/hr at 1800 Monitor TPN labs on Mon/Thurs, recheck electrolytes with AM labs tomorrow.   Lakeisha Waldrop Lenis NoonrmD 08/29/2022,8:37 AM

## 2022-08-30 ENCOUNTER — Inpatient Hospital Stay (HOSPITAL_COMMUNITY): Payer: Medicare HMO

## 2022-08-30 ENCOUNTER — Encounter (HOSPITAL_COMMUNITY): Payer: Self-pay | Admitting: Internal Medicine

## 2022-08-30 DIAGNOSIS — R197 Diarrhea, unspecified: Secondary | ICD-10-CM | POA: Diagnosis not present

## 2022-08-30 DIAGNOSIS — R112 Nausea with vomiting, unspecified: Secondary | ICD-10-CM | POA: Diagnosis not present

## 2022-08-30 LAB — CEA: CEA: 1.8 ng/mL (ref 0.0–4.7)

## 2022-08-30 LAB — BASIC METABOLIC PANEL
Anion gap: 5 (ref 5–15)
BUN: 19 mg/dL (ref 8–23)
CO2: 28 mmol/L (ref 22–32)
Calcium: 8.3 mg/dL — ABNORMAL LOW (ref 8.9–10.3)
Chloride: 108 mmol/L (ref 98–111)
Creatinine, Ser: 0.82 mg/dL (ref 0.44–1.00)
GFR, Estimated: >60 mL/min (ref >60)
Glucose, Bld: 140 mg/dL — ABNORMAL HIGH (ref 70–99)
Potassium: 3.7 mmol/L (ref 3.5–5.1)
Sodium: 141 mmol/L (ref 135–145)

## 2022-08-30 LAB — CBC
HCT: 33.8 % — ABNORMAL LOW (ref 36.0–46.0)
Hemoglobin: 10.4 g/dL — ABNORMAL LOW (ref 12.0–15.0)
MCH: 31.1 pg (ref 26.0–34.0)
MCHC: 30.8 g/dL (ref 30.0–36.0)
MCV: 101.2 fL — ABNORMAL HIGH (ref 80.0–100.0)
Platelets: 274 10*3/uL (ref 150–400)
RBC: 3.34 MIL/uL — ABNORMAL LOW (ref 3.87–5.11)
RDW: 12.8 % (ref 11.5–15.5)
WBC: 12.5 10*3/uL — ABNORMAL HIGH (ref 4.0–10.5)
nRBC: 0 % (ref 0.0–0.2)

## 2022-08-30 LAB — GLUCOSE, CAPILLARY
Glucose-Capillary: 120 mg/dL — ABNORMAL HIGH (ref 70–99)
Glucose-Capillary: 129 mg/dL — ABNORMAL HIGH (ref 70–99)
Glucose-Capillary: 137 mg/dL — ABNORMAL HIGH (ref 70–99)
Glucose-Capillary: 143 mg/dL — ABNORMAL HIGH (ref 70–99)

## 2022-08-30 LAB — MAGNESIUM: Magnesium: 2.2 mg/dL (ref 1.7–2.4)

## 2022-08-30 LAB — BRAIN NATRIURETIC PEPTIDE: B Natriuretic Peptide: 220.9 pg/mL — ABNORMAL HIGH (ref 0.0–100.0)

## 2022-08-30 LAB — CANCER ANTIGEN 19-9: CA 19-9: 6 U/mL (ref 0–35)

## 2022-08-30 LAB — CA 125: Cancer Antigen (CA) 125: 659 U/mL — ABNORMAL HIGH (ref 0.0–38.1)

## 2022-08-30 LAB — PHOSPHORUS: Phosphorus: 2.6 mg/dL (ref 2.5–4.6)

## 2022-08-30 MED ORDER — POTASSIUM CHLORIDE 10 MEQ/50ML IV SOLN
10.0000 meq | INTRAVENOUS | Status: AC
  Administered 2022-08-30 (×2): 10 meq via INTRAVENOUS
  Filled 2022-08-30 (×2): qty 50

## 2022-08-30 MED ORDER — FUROSEMIDE 10 MG/ML IJ SOLN
40.0000 mg | Freq: Once | INTRAMUSCULAR | Status: AC
  Administered 2022-08-30: 40 mg via INTRAVENOUS
  Filled 2022-08-30: qty 4

## 2022-08-30 MED ORDER — INSULIN ASPART 100 UNIT/ML IJ SOLN
0.0000 [IU] | INTRAMUSCULAR | Status: DC
  Administered 2022-08-30: 2 [IU] via SUBCUTANEOUS
  Administered 2022-08-31: 3 [IU] via SUBCUTANEOUS
  Administered 2022-08-31 – 2022-09-01 (×8): 2 [IU] via SUBCUTANEOUS
  Administered 2022-09-01 – 2022-09-02 (×3): 3 [IU] via SUBCUTANEOUS
  Administered 2022-09-02: 2 [IU] via SUBCUTANEOUS
  Administered 2022-09-02: 3 [IU] via SUBCUTANEOUS
  Administered 2022-09-02: 2 [IU] via SUBCUTANEOUS
  Administered 2022-09-02 – 2022-09-03 (×2): 3 [IU] via SUBCUTANEOUS
  Administered 2022-09-03: 2 [IU] via SUBCUTANEOUS
  Administered 2022-09-03: 3 [IU] via SUBCUTANEOUS

## 2022-08-30 MED ORDER — MIDAZOLAM HCL 2 MG/2ML IJ SOLN
INTRAMUSCULAR | Status: AC | PRN
  Administered 2022-08-30: .5 mg via INTRAVENOUS

## 2022-08-30 MED ORDER — MIDAZOLAM HCL 2 MG/2ML IJ SOLN
INTRAMUSCULAR | Status: AC
  Filled 2022-08-30: qty 4

## 2022-08-30 MED ORDER — SODIUM CHLORIDE 0.9 % IV SOLN: INTRAVENOUS | Status: AC

## 2022-08-30 MED ORDER — FENTANYL CITRATE (PF) 100 MCG/2ML IJ SOLN
INTRAMUSCULAR | Status: AC
  Filled 2022-08-30: qty 2

## 2022-08-30 MED ORDER — TRACE MINERALS CU-MN-SE-ZN 300-55-60-3000 MCG/ML IV SOLN
INTRAVENOUS | Status: AC
  Filled 2022-08-30: qty 392

## 2022-08-30 MED ORDER — FENTANYL CITRATE (PF) 100 MCG/2ML IJ SOLN
INTRAMUSCULAR | Status: AC | PRN
  Administered 2022-08-30: 50 ug via INTRAVENOUS

## 2022-08-30 NOTE — Progress Notes (Signed)
   Subjective/Chief Complaint: Still with some abdominal pain, but improved some with NGT.  Over 2L out since placement.   Objective: Vital signs in last 24 hours: Temp:  [97.5 F (36.4 C)-98.6 F (37 C)] 97.8 F (36.6 C) (02/21 0801) Pulse Rate:  [109-118] 109 (02/21 0801) Resp:  [17-19] 18 (02/21 0801) BP: (143-168)/(78-103) 143/78 (02/21 0801) SpO2:  [96 %-100 %] 97 % (02/21 0801) Weight:  [106.5 kg] 106.5 kg (02/21 0500) Last BM Date : 08/27/22  Intake/Output from previous day: 02/20 0701 - 02/21 0700 In: 786.5 [I.V.:648.6; IV Piggyback:137.9] Out: 2950 [Urine:800; Emesis/NG output:2150] Intake/Output this shift: No intake/output data recorded.  Ab distended, tight despite NGT placement, mild diffuse tenderness, no peritonitis or guarding.  NGT placed with 2.1L of bilious output in 24/hrs.  Lab Results:  Recent Labs    08/28/22 0316  WBC 13.7*  HGB 11.3*  HCT 36.0  PLT 255   BMET Recent Labs    08/29/22 0331 08/30/22 0141  NA 142 141  K 3.8 3.7  CL 107 108  CO2 26 28  GLUCOSE 109* 140*  BUN 19 19  CREATININE 0.94 0.82  CALCIUM 8.4* 8.3*   PT/INR No results for input(s): "LABPROT", "INR" in the last 72 hours. ABG Recent Labs    08/29/22 0950  PHART 7.46*  HCO3 28.5*    Studies/Results: DG Abd Portable 1V  Result Date: 08/29/2022 CLINICAL DATA:  NGT placement EXAM: PORTABLE ABDOMEN - 1 VIEW COMPARISON:  08/26/2022 FINDINGS: Limited image of the upper abdomen demonstrates NG tube superimposed with stomach, below the diaphragm. IMPRESSION: NGT in place. Electronically Signed   By: Sammie Bench M.D.   On: 08/29/2022 12:34   [place] EKG SITE RITE  Result Date: 08/28/2022 If Site Rite image not attached, placement could not be confirmed due to current cardiac rhythm.   Anti-infectives: Anti-infectives (From admission, onward)    Start     Dose/Rate Route Frequency Ordered Stop   08/20/22 2000  piperacillin-tazobactam (ZOSYN) IVPB 3.375 g         3.375 g 12.5 mL/hr over 240 Minutes Intravenous Every 8 hours 08/20/22 1325 08/25/22 1918   08/20/22 1130  piperacillin-tazobactam (ZOSYN) IVPB 3.375 g        3.375 g 100 mL/hr over 30 Minutes Intravenous  Once 08/20/22 1126 08/20/22 1223       Assessment/Plan: Abdominal pain, pneumobilia, gastric pneumatosis, N/V/D - Reports her pain is improved overall, but still present. No peritonitis on exam. No indication for emergency surgery currently.  -cytology with atypical cells, further eval needed -CA19-9, CEA, normal but CA-125 is quite elevated over 600. -IR consult for omental biopsy.  Suspect she has a malignancy until proven otherwise. -cont NPO/NGT for now.   FEN -NPO, NGT/TPN VTE - SCDs, ok for chemical prophylaxis from our standpoint ID - zosyn 2/11 >> 2/20 Foley - per primary    DM Hx of seizures HTN IBS Morbid obesity GAD AKI - resolved   I reviewed hospitalist notes, last 24 h vitals and pain scores, last 48 h intake and output, last 24 h labs and trends, and last 24 h imaging results.  Discussed with primary team.   Henreitta Cea, PA-C 08/30/2022

## 2022-08-30 NOTE — Progress Notes (Signed)
PHARMACY - TOTAL PARENTERAL NUTRITION CONSULT NOTE   Indication:  Intolerance to enteral feeding  Patient Measurements: Height: 5' 5"$  (165.1 cm) Weight: 106.5 kg (234 lb 12.6 oz) IBW/kg (Calculated) : 57 TPN AdjBW (KG): 67.7 Body mass index is 39.07 kg/m.  Assessment:  Pt is a 26 yoF admitted on 2/11 with abdominal pain, N/V ongoing for several weeks. Imaging revealed gastric pneumatosis, penumobilia - no indication for surgery at this time. Pt has had minimal PO intake > 1 week. Pharmacy consulted to initiate TPN.  Glucose / Insulin: T2DM. CBG goal < 150 -CBG range: 117-143 since start of TPN. sSSI q4h - 2 units/24 hrs Electrolytes: All lytes WNL. K (3.7) on lower end of normal. Alb WNL, Ca = 8.3 Renal: SCr, BUN WNL Hepatic: T.bili slightly elevated. Alk Phos, LFTs WNL. TG, Alb WNL (note: pt receiving albumin TID) Intake / Output; MIVF: mIVF: NS @ 25 mL/hr -UOP: 800 mL. Emesis/NG output: 2150 mL (pt agreed to placement on 2/20) GI Imaging: -2/11 CT Abd: Portal vein and mesenteric venous gas. Mild pneumatosis in the gastric wall without evidence of gastric wall thickening. Moderate wall thickening involving transverse colon (differential includes infectious or ischemic colitis or neoplasm). Moderate ascites, cholethiasis.  -2/18 CT Abd: Persistent mild ascites (infectious or neoplastic etiology?). Improved appearance of transverse colon, no evidence of perforation. Resolution of gastric pneumatosis as well as portal and mesenteric venous gas. GI Surgeries / Procedures:   Central access: Double lumen PICC placed 2/20 TPN start date: 2/20   Nutritional Goals: Goal TPN rate is 55 mL/hr (provides 82 g of protein and 1805 kcals per day)  Discussed with MD on 2/20: Using Clinisol 15% to concentrate TPN to be able to meet nutritional needs with minimal volume.   RD Assessment: Estimated Needs Total Energy Estimated Needs: 1800-2000 kcals Total Protein Estimated Needs: 80-100  grams Total Fluid Estimated Needs: >/= 1.7L  Current Nutrition:  NPO except for sips with meds, ice chips TPN  Plan:  Now:  KCl 10 mEq/50 mL x 2 doses  At 1800: Increase TPN to 35 mL/hr at 1800 Will provide 59 g protein, 168 g dextrose, 1116 kcal Electrolytes in TPN: Standard. No changes. Na 66mq/L, K 5109m/L, Ca 83m68mL, Mg 83mE7m, and Phos 183mm23m.  Cl:Ac 1:1 Add standard MVI and trace elements to TPN.  Thiamine in TPN x5 days (2/20 > 2/24) Increase SSI to moderate q4h Reduce MIVF to 15 mL/hr at 1800 Monitor TPN labs on Mon/Thurs.  Tinlee Navarrette Lenis NoonrmD 08/30/2022,7:48 AM

## 2022-08-30 NOTE — Progress Notes (Signed)
[facility] Gastroenterology Progress Note  [name] 72 y.o. 01/29/1951  CC: Abdominal distention, likely malignant ascites   Subjective Seen and examined at bedside.  Just came back from IR guided omental biopsy.    Objective: Vital signs in last 24 hours: Vitals:   08/30/22 0940 08/30/22 0945  BP: (!) 163/96 (!) 163/92  Pulse: (!) 117 (!) 118  Resp: (!) 21 17  Temp:    SpO2: 95% 96%    Physical Exam:  General:   Alert, resting in bed.  NG suction in place. Lungs: No visible respiratory distress Heart: Tachycardia Abdomen: Abdomen is moderately distended and somewhat tense, bowel sounds present, no peritoneal signs. Alert and oriented x 3 Mood and affect normal  Lab Results: Recent Labs    08/29/22 0331 08/30/22 0141  NA 142 141  K 3.8 3.7  CL 107 108  CO2 26 28  GLUCOSE 109* 140*  BUN 19 19  CREATININE 0.94 0.82  CALCIUM 8.4* 8.3*  MG 2.2 2.2  PHOS 2.9 2.6   Recent Labs    08/29/22 0331  AST 31  ALT 19  ALKPHOS 37*  BILITOT 1.6*  PROT 6.3*  ALBUMIN 4.6   Recent Labs    08/28/22 0316  WBC 13.7*  HGB 11.3*  HCT 36.0  MCV 100.6*  PLT 255   No results for input(s): "LABPROT", "INR" in the last 72 hours.    Assessment/Plan: -Abdominal pain ascites with initial CT scan concerning for gastric pneumatosis as well as portal venous gas.  Repeat CT with IV contrast showed resolution of those findings. -Ascites.  SAG 0.3.  Less likely to be from portal hypertension.  Fluid analysis positive for SBP.  Elevated LDH.  Normal LFTs now. -Omental thickening.  Concerning for malignancy.   Recommendations ----------------------- -Patient with elevated CA-125.  Normal CEA and CA 19-9.  Undergoing omental biopsy today. Patient with recent gastric pneumatosis.  EGD may increase risk of gastric perforation.  Also air insufflation during EGD may cause worsening of abdominal distention. -She may benefit from upper GI small bowel follow-through to rule out any  upper GI/ small bowel malignancy. -Follow ascitic fluid cytology. -No further inpatient GI workup planned.  GI will sign off.  Call us back if needed.   Otis Brace MD, [place] 08/30/2022, 10:35 AM  Contact #  [phone]

## 2022-08-30 NOTE — Plan of Care (Signed)
  Problem: Pain Managment: Goal: General experience of comfort will improve Outcome: Progressing   Problem: Safety: Goal: Ability to remain free from injury will improve Outcome: Progressing   Problem: Skin Integrity: Goal: Risk for impaired skin integrity will decrease Outcome: Progressing   

## 2022-08-30 NOTE — Progress Notes (Addendum)
Referring Physician(s): Byerly,F  Supervising Physician: Arne Cleveland  Patient Status:  [facility] - In-pt  Chief Complaint:  Abdominal pain  Subjective: Patient known to IR service from paracentesis on 08/22/2022 and again on 08/26/2022.  She has a past medical history of diabetes, anxiety disorder/panic attacks, CVA/seizures 2013, hypertension, irritable bowel syndrome, melanoma 1997 and obesity.  She was admitted to [facility] on 2/11 with worsening abdominal pain, diminished appetite, weight loss, diarrhea, occasional nausea and vomiting.  Latest CT abdomen pelvis revealed:  Persistent mild ascites with changes in the omental fat anteriorly again both infectious and neoplastic etiologies deserve consideration.   Improved appearance of the transverse colon when compared with the prior exam. No evidence of perforation is noted.   Resolution of previously seen gastric pneumatosis as well as portal and mesenteric venous gas.   Cholelithiasis without complicating factors.   New left pleural effusion and left lower lobe consolidation  Cytology from recent paracentesis revealed atypical cells.  Request now received from surgical team for image guided omental biopsy for further evaluation.    Patient currently denies fever, headache, chest pain, cough, back pain, vomiting or bleeding.  Continues to have some abdominal pain/distention, dyspnea, occasional nausea     Past Medical History:  Diagnosis Date   Diabetes mellitus type 2, noninsulin dependent ([place]) 08/20/2022   Generalized anxiety disorder 08/20/2022   History of panic attacks 08/20/2022   History of seizures 2013 08/20/2022   Hypertension    Hypertensive urgency in 2012 08/20/2022   Irritable bowel syndrome (IBS) 08/20/2022   Melanoma of back ([place]) 1997   Severe obesity (BMI 35.0-35.9 with comorbidity) ([place]) 08/20/2022   Past Surgical History:  Procedure Laterality Date   CATARACT EXTRACTION  2015      Allergies: Nsaids  Medications: Prior to Admission medications   Medication Sig Start Date End Date Taking? Authorizing Provider  acetaminophen (TYLENOL) 500 MG tablet Take 1,000 mg by mouth every 6 (six) hours as needed for mild pain.   Yes [provider]  ALPRAZolam (XANAX) 0.25 MG tablet Take 0.25 mg by mouth 3 (three) times daily as needed. For anxiety    Yes [provider]  citalopram (CELEXA) 20 MG tablet Take 20 mg by mouth daily.     Yes [provider]  dextromethorphan (DELSYM) 30 MG/5ML liquid Take 60 mg by mouth at bedtime as needed for cough. For cough   Yes [provider]  dicyclomine (BENTYL) 20 MG tablet Take 20 mg by mouth 2 (two) times daily as needed for spasms.   Yes [provider]  famotidine (PEPCID AC MAXIMUM STRENGTH) 20 MG tablet Take 20 mg by mouth daily as needed for heartburn or indigestion.   Yes [provider]  hydrochlorothiazide (HYDRODIURIL) 12.5 MG tablet Take 12.5 mg by mouth daily. 07/28/22  Yes [provider]  hydroxypropyl methylcellulose (ISOPTO TEARS) 2.5 % ophthalmic solution Place 1 drop into both eyes 3 (three) times daily as needed. For dry eyes    Yes [provider]  loratadine (CLARITIN) 10 MG tablet Take 10 mg by mouth daily.     Yes [provider]  losartan (COZAAR) 100 MG tablet Take 100 mg by mouth daily. 07/28/22  Yes [provider]  metoprolol (LOPRESSOR) 50 MG tablet Take 50 mg by mouth 2 (two) times daily.     Yes [provider]  oxymetazoline (AFRIN) 0.05 % nasal spray Place 1 spray into both nostrils daily as needed for  congestion.   Yes [provider]  pravastatin (PRAVACHOL) 10 MG tablet Take 10 mg by mouth at bedtime. 07/19/22  Yes [provider]     Vital Signs: BP (!) 143/78 (BP Location: Left Arm)   Pulse (!) 109   Temp 97.8 F (36.6 C) (Oral)   Resp 18   Ht 5' 5"$  (1.651 m)   Wt 234 lb 12.6 oz  (106.5 kg)   SpO2 97%   BMI 39.07 kg/m   Physical Exam patient awake, alert.  NG-tube in place.  Chest with slightly diminished breath sounds left base, right clear.  Heart with tachycardic but regular rhythm.  Abdomen obese, distended, few bowel sounds, some mild generalized tenderness to palpation; no significant lower extremity edema.    Imaging: DG Abd Portable 1V  Result Date: 08/29/2022 CLINICAL DATA:  NGT placement EXAM: PORTABLE ABDOMEN - 1 VIEW COMPARISON:  08/26/2022 FINDINGS: Limited image of the upper abdomen demonstrates NG tube superimposed with stomach, below the diaphragm. IMPRESSION: NGT in place. Electronically Signed   By: Sammie Bench M.D.   On: 08/29/2022 12:34   [place] EKG SITE RITE  Result Date: 08/28/2022 If Site Rite image not attached, placement could not be confirmed due to current cardiac rhythm.  CT ABDOMEN PELVIS W CONTRAST  Result Date: 08/27/2022 CLINICAL DATA:  Abdominal pain, history of recent paracentesis EXAM: CT ABDOMEN AND PELVIS WITH CONTRAST TECHNIQUE: Multidetector CT imaging of the abdomen and pelvis was performed using the standard protocol following bolus administration of intravenous contrast. RADIATION DOSE REDUCTION: This exam was performed according to the departmental dose-optimization program which includes automated exposure control, adjustment of the mA and/or kV according to patient size and/or use of iterative reconstruction technique. CONTRAST:  166m OMNIPAQUE IOHEXOL 300 MG/ML  SOLN COMPARISON:  08/20/2022 FINDINGS: Lower chest: New small left pleural effusion is noted with left lower lobe consolidation. No parenchymal nodules are seen. Hepatobiliary: Previously seen portal venous air within the liver has resolved in the interval no focal mass lesion is seen. The gallbladder is well distended with multiple gallstones stable from the prior exam. No biliary ductal dilatation is seen. Pancreas: Unremarkable. No pancreatic ductal dilatation or  surrounding inflammatory changes. Spleen: Normal in size without focal abnormality. Adrenals/Urinary Tract: Adrenal glands are within normal limits. Kidneys demonstrate a normal enhancement pattern bilaterally. No renal calculi or obstructive changes are seen. Normal excretion is seen bilaterally. The bladder is decompressed by Foley catheter. Stomach/Bowel: Colon is predominately decompressed. The transverse colon demonstrates some air within in the previously seen diffuse wall thickening and pericolonic inflammatory change is reduced in the interval from the prior exam. The appendix is not well visualized. Few mildly prominent loops of small bowel are noted without definitive obstructive change. Edema within the mesentery is noted in these changes are likely reactive to the ascites identified. The stomach is well distended with fluid and contrast material. No extravasation is identified. The previously seen pneumatosis in the gastric wall has resolved as has the degree of portal and mesenteric venous gas. Small hiatal hernia is again noted. Vascular/Lymphatic: Atherosclerotic calcifications of the abdominal aorta are noted. No sizable adenopathy is seen. Reproductive: Uterus and bilateral adnexa are unremarkable. Other: Mild to moderate ascites remains in the abdomen despite interval paracentesis yesterday. Some soft tissue changes are again noted in the omental fat anteriorly. This is similar to that seen on prior exam. Musculoskeletal: Degenerative changes of lumbar spine are noted. No acute bony abnormality is seen. Mild changes of anasarca  are noted in the lateral abdominal walls. IMPRESSION: Persistent mild ascites with changes in the omental fat anteriorly again both infectious and neoplastic etiologies deserve consideration. Improved appearance of the transverse colon when compared with the prior exam. No evidence of perforation is noted. Resolution of previously seen gastric pneumatosis as well as portal  and mesenteric venous gas. Cholelithiasis without complicating factors. New left pleural effusion and left lower lobe consolidation. Electronically Signed   By: Inez Catalina M.D.   On: 08/27/2022 17:14   US Paracentesis  Result Date: 08/27/2022 INDICATION: Recurrent ascites of unknown etiology. Request for therapeutic paracentesis. EXAM: ULTRASOUND GUIDED PARACENTESIS MEDICATIONS: 1% lidocaine 20 mL COMPLICATIONS: None immediate. PROCEDURE: Informed written consent was obtained from the patient after a discussion of the risks, benefits and alternatives to treatment. A timeout was performed prior to the initiation of the procedure. Initial ultrasound scanning demonstrates a large amount of ascites within the left lateral abdomen. The left lateral abdomen was prepped and draped in the usual sterile fashion. 1% lidocaine was used for local anesthesia. Following this, a 19 gauge, 10-cm, Yueh catheter was introduced. An ultrasound image was saved for documentation purposes. The paracentesis was performed. The catheter was removed and a dressing was applied. The patient tolerated the procedure well without immediate post procedural complication. FINDINGS: A total of approximately 2.2 L of clear yellow fluid was removed. IMPRESSION: Successful ultrasound-guided paracentesis yielding 2.2 liters of peritoneal fluid. Procedure performed by: Gareth Eagle, PA-C Electronically Signed   By: Albin Felling M.D.   On: 08/27/2022 11:34   DG Abd Portable 1V  Result Date: 08/26/2022 CLINICAL DATA:  Nausea vomiting EXAM: PORTABLE ABDOMEN - 1 VIEW COMPARISON:  08/23/2022 FINDINGS: The bowel gas pattern is normal. No radio-opaque calculi or other significant radiographic abnormality are seen. IMPRESSION: Nonspecific bowel gas pattern. Electronically Signed   By: Sammie Bench M.D.   On: 08/26/2022 10:10    Labs:  CBC: Recent Labs    08/25/22 0413 08/26/22 0319 08/27/22 0828 08/28/22 0316  WBC 11.8* 11.4* 13.3* 13.7*   HGB 12.8 11.7* 10.8* 11.3*  HCT 40.1 36.9 34.0* 36.0  PLT 280 330 254 255    COAGS: No results for input(s): "INR", "APTT" in the last 8760 hours.  BMP: Recent Labs    08/27/22 0828 08/28/22 1614 08/29/22 0331 08/30/22 0141  NA 142 145 142 141  K 3.7 4.2 3.8 3.7  CL 103 105 107 108  CO2 26 28 26 28  $ GLUCOSE 94 98 109* 140*  BUN 17 16 19 19  $ CALCIUM 8.5* 8.5* 8.4* 8.3*  CREATININE 0.98 0.97 0.94 0.82  GFRNONAA >60 >60 >60 >60    LIVER FUNCTION TESTS: Recent Labs    08/23/22 0540 08/24/22 0308 08/25/22 0413 08/29/22 0331  BILITOT 1.4* 1.9* 1.5* 1.6*  AST 42* 35 30 31  ALT 31 25 20 19  $ ALKPHOS 63 56 48 37*  PROT 4.9* 5.1* 5.1* 6.3*  ALBUMIN 1.9* 2.2* 2.3* 4.6    Assessment and Plan: Patient known to IR service from paracentesis on 08/22/2022 and again on 08/26/2022.  She has a past medical history of diabetes, anxiety disorder/panic attacks, CVA/seizures 2013, hypertension, irritable bowel syndrome, melanoma 1997 and obesity.  She was admitted to [facility] on 2/11 with worsening abdominal pain, diminished appetite, weight loss, diarrhea, occasional nausea and vomiting.  Latest CT abdomen pelvis revealed:  Persistent mild ascites with changes in the omental fat anteriorly again both infectious and neoplastic etiologies deserve consideration.  Improved appearance of the transverse colon when compared with the prior exam. No evidence of perforation is noted.   Resolution of previously seen gastric pneumatosis as well as portal and mesenteric venous gas.   Cholelithiasis without complicating factors.   New left pleural effusion and left lower lobe consolidation  Cytology from recent paracentesis revealed atypical cells.  CA 125 - 659. Request now received from surgical team for image guided omental biopsy for further evaluation.  Imaging studies have been reviewed by Dr. Vernard Gambles.Risks and benefits of procedure was discussed with the patient including,  but not limited to bleeding, infection, damage to adjacent structures or low yield requiring additional tests.  All of the questions were answered and there is agreement to proceed.  Consent signed and in chart. Procedure scheduled for this a.m.    Electronically Signed: D. Rowe Robert, PA-C 08/30/2022, 9:00 AM   I spent a total of 20 minutes at the the patient's bedside AND on the patient's hospital floor or unit, greater than 50% of which was counseling/coordinating care for CT-guided omental biopsy    Patient ID: [name], female   DOB: 05-30-1951, 72 y.o.   MRN: [id number]

## 2022-08-30 NOTE — Progress Notes (Addendum)
PROGRESS NOTE  Shaquala Cunico  [id number] DOB: 1950/09/02 DOA: 08/20/2022 PCP: London Pepper, MD   Brief Narrative:  Patient is a 72 year old female with history of obesity, diabetes type 2, anxiety, depression, CVA, hypertension, seizure disorder who presented with worsening abdominal pain for 3 weeks, decreased appetite, unintentional weight loss, diarrhea, vomiting.  On presentation she had leukocytosis and elevated lactate of 2.2.  CT abdomen/pelvis showed possible infectious or ischemic colitis indicated by colonic wall thickening, suspected peritoneal carcinomatosis due to presence of ascites, omental stranding, mesenteric venous gas and pneumatosis.  General surgery consulted.  Underwent paracentesis twice.  Clinically not improving.  IR did omental biopsy, elevated CA125  Assessment & Plan:  Principal Problem:   Nausea, vomiting, and diarrhea Active Problems:   Hypertension   AKI (acute kidney injury) ([place])   Morbid obesity ([place])   Hyperglycemia   Ascites   Pneumobilia   Diabetes mellitus type 2, noninsulin dependent (HCC)   Generalized anxiety disorder   Cholelithiasis   Slow rate of speech - history of CVA 2012   Abdominal pain, diffuse  Abdomen pain/pneumobilia/gastric pneumatosis: Presented with nausea, vomiting, diarrhea, abdominal pain, weight loss.  Found to have ascites, underwent paracentesis twice.  Imaging showed possible colitis/peritoneal carcinomatosis, omental stranding.  Underwent paracentesis twice.  Cytology showed atypical cells.  CA 19-9, CEA normal but CEA-125 elevated.  IR consulted for omental biopsy.  High suspicion for malignancy. Currently n.p.o., NG tube in place. General surgery closely following.  Continue pain management, supportive care.  Antibiotics discontinued. Started on TPN. Abdomen remains distended, complains of abdominal discomfort.  Macrocytosis/vitamin B12 deficiency: Low vitamin B12.  Given a dose of IM vitamin B12 injection.   Continue oral supplementation on dc when able to take by mouth  AKI on CKD stage IIIa: Currently kidney function at baseline.  Baseline creatinine around 1.1.  Losartan, hydrochlorothiazide on hold  Hypertension/sinus tachycardia: Remains hypertensive.  On IV metoprolol.  Takes metoprolol, losartan, hydrochlorothiazide at home.  Acute hypoxic respiratory failure: Currently requiring 3 to 4 L of oxygen per minute.  Unclear etiology.  ABG done on 2/20 shows normal pCO2, normal O2.  Checking chest x-ray, has crackles on auscultation.  Checking BNP  Elevated liver enzymes/hyperbilirubinemia: Suspected to be from hypoperfusion.  Currently improved  Diabetes type 2: Currently sugars are stable.  Continue sliding scale and monitor  Anxiety: On as needed Xanax  History of prior CVA  Morbid obesity: BMI 39    Nutrition Problem: Inadequate oral intake Etiology: inability to eat    DVT prophylaxis:SCDs Start: 08/20/22 1402     Code Status: Full Code  Family Communication: None at the bedside  Patient status:Inpatient  Patient is from :Home  Anticipated discharge NE:[id number]  Estimated DC date:note sure, needs to have complete workup before discharge   Consultants: Surgery and IR   Procedures: Paracentesis, omental biopsy  Antimicrobials:  Anti-infectives (From admission, onward)    Start     Dose/Rate Route Frequency Ordered Stop   08/20/22 2000  piperacillin-tazobactam (ZOSYN) IVPB 3.375 g        3.375 g 12.5 mL/hr over 240 Minutes Intravenous Every 8 hours 08/20/22 1325 08/25/22 1918   08/20/22 1130  piperacillin-tazobactam (ZOSYN) IVPB 3.375 g        3.375 g 100 mL/hr over 30 Minutes Intravenous  Once 08/20/22 1126 08/20/22 1223       Subjective: Patient seen and examined at bedside today.  She just came from biopsy.  Lying in bed.  Not in apparent  distress but complains of abdominal discomfort and some shortness of breath.  On 2 L of oxygen per minute.  Not in respite  distress.  NG tube in place  Objective: Vitals:   08/30/22 0544 08/30/22 0801 08/30/22 0930 08/30/22 0940  BP: (!) 164/91 (!) 143/78 (!) 168/102 (!) 163/96  Pulse: (!) 114 (!) 109 (!) 117 (!) 117  Resp: 17 18 (!) 21 (!) 21  Temp: 98.6 F (37 C) 97.8 F (36.6 C)    TempSrc: Oral Oral    SpO2: 98% 97% 96% 95%  Weight:      Height:        Intake/Output Summary (Last 24 hours) at 08/30/2022 0943 Last data filed at 08/30/2022 0555 Gross per 24 hour  Intake 786.53 ml  Output 2950 ml  Net -2163.47 ml   Filed Weights   08/28/22 0403 08/29/22 0500 08/30/22 0500  Weight: 112.5 kg 105.8 kg 106.5 kg    Examination:  General exam: Overall comfortable, not in distress, obese HEENT: PERRL, NG tube Respiratory system: Mild bilateral crackles  cardiovascular system: Sinus tachycardia Gastrointestinal system: Abdomen is distended, bowel sounds not heard  Central nervous system: Alert and oriented Extremities: No edema, no clubbing ,no cyanosis,picc line on right arm Skin: No rashes, no ulcers,no icterus     Data Reviewed: I have personally reviewed following labs and imaging studies  CBC: Recent Labs  Lab 08/24/22 0308 08/25/22 0413 08/26/22 0319 08/27/22 0828 08/28/22 0316  WBC 11.8* 11.8* 11.4* 13.3* 13.7*  HGB 13.4 12.8 11.7* 10.8* 11.3*  HCT 43.9 40.1 36.9 34.0* 36.0  MCV 101.9* 100.3* 100.0 101.2* 100.6*  PLT 299 280 330 254 [id number]   Basic Metabolic Panel: Recent Labs  Lab 08/26/22 0319 08/27/22 0828 08/28/22 1614 08/29/22 0331 08/30/22 0141  NA 132* 142 145 142 141  K 3.7 3.7 4.2 3.8 3.7  CL 99 103 105 107 108  CO2 26 26 28 26 28  $ GLUCOSE 118* 94 98 109* 140*  BUN 17 17 16 19 19  $ CREATININE 0.99 0.98 0.97 0.94 0.82  CALCIUM 7.8* 8.5* 8.5* 8.4* 8.3*  MG  --   --   --  2.2 2.2  PHOS  --   --   --  2.9 2.6     Recent Results (from the past 240 hour(s))  Resp panel by RT-PCR (RSV, Flu A&B, Covid) Anterior Nasal Swab     Status: None   Collection Time:  08/20/22 10:47 AM   Specimen: Anterior Nasal Swab  Result Value Ref Range Status   SARS Coronavirus 2 by RT PCR NEGATIVE NEGATIVE Final    Comment: (NOTE) SARS-CoV-2 target nucleic acids are NOT DETECTED.  The SARS-CoV-2 RNA is generally detectable in upper respiratory specimens during the acute phase of infection. The lowest concentration of SARS-CoV-2 viral copies this assay can detect is 138 copies/mL. A negative result does not preclude SARS-Cov-2 infection and should not be used as the sole basis for treatment or other patient management decisions. A negative result may occur with  improper specimen collection/handling, submission of specimen other than nasopharyngeal swab, presence of viral mutation(s) within the areas targeted by this assay, and inadequate number of viral copies(<138 copies/mL). A negative result must be combined with clinical observations, patient history, and epidemiological information. The expected result is Negative.  Fact Sheet for Patients:  EntrepreneurPulse.com.au  Fact Sheet for Healthcare Providers:  IncredibleEmployment.be  This test is no t yet approved or cleared by the [place] and  has been authorized for detection and/or diagnosis of SARS-CoV-2 by FDA under an Emergency Use Authorization (EUA). This EUA will remain  in effect (meaning this test can be used) for the duration of the COVID-19 declaration under Section 564(b)(1) of the Act, 21 U.S.C.section 360bbb-3(b)(1), unless the authorization is terminated  or revoked sooner.       Influenza A by PCR NEGATIVE NEGATIVE Final   Influenza B by PCR NEGATIVE NEGATIVE Final    Comment: (NOTE) The Xpert Xpress SARS-CoV-2/FLU/RSV plus assay is intended as an aid in the diagnosis of influenza from Nasopharyngeal swab specimens and should not be used as a sole basis for treatment. Nasal washings and aspirates are unacceptable for Xpert Xpress  SARS-CoV-2/FLU/RSV testing.  Fact Sheet for Patients: EntrepreneurPulse.com.au  Fact Sheet for Healthcare Providers: IncredibleEmployment.be  This test is not yet approved or cleared by the [place] FDA and has been authorized for detection and/or diagnosis of SARS-CoV-2 by FDA under an Emergency Use Authorization (EUA). This EUA will remain in effect (meaning this test can be used) for the duration of the COVID-19 declaration under Section 564(b)(1) of the Act, 21 U.S.C. section 360bbb-3(b)(1), unless the authorization is terminated or revoked.     Resp Syncytial Virus by PCR NEGATIVE NEGATIVE Final    Comment: (NOTE) Fact Sheet for Patients: EntrepreneurPulse.com.au  Fact Sheet for Healthcare Providers: IncredibleEmployment.be  This test is not yet approved or cleared by the [place] FDA and has been authorized for detection and/or diagnosis of SARS-CoV-2 by FDA under an Emergency Use Authorization (EUA). This EUA will remain in effect (meaning this test can be used) for the duration of the COVID-19 declaration under Section 564(b)(1) of the Act, 21 U.S.C. section 360bbb-3(b)(1), unless the authorization is terminated or revoked.  Performed at [facility], [place] [address], [place], [place] [address]   Blood culture (routine x 2)     Status: None   Collection Time: 08/20/22 11:15 AM   Specimen: BLOOD  Result Value Ref Range Status   Specimen Description   Final    BLOOD LEFT ANTECUBITAL Performed at [facility] [address], [place], [place] [address]    Special Requests   Final    BOTTLES DRAWN AEROBIC AND ANAEROBIC Blood Culture adequate volume Performed at [facility] [address], [place], [place] [address]    Culture   Final    NO GROWTH 5 DAYS Performed at [facility], [place] [address] [place], [place]  [address]    Report Status 08/25/2022 FINAL  Final  Blood culture (routine x 2)     Status: None   Collection Time: 08/20/22 11:20 AM   Specimen: BLOOD  Result Value Ref Range Status   Specimen Description   Final    BLOOD BLOOD RIGHT HAND Performed at [facility] [address] [place], [place] [address]    Special Requests   Final    BOTTLES DRAWN AEROBIC AND ANAEROBIC Blood Culture results may not be optimal due to an inadequate volume of blood received in culture bottles Performed at [facility] [address] [place], [place] [address]    Culture   Final    NO GROWTH 5 DAYS Performed at [facility], [place] [address], [place], [place] [address]    Report Status 08/25/2022 FINAL  Final  MRSA Next Gen by PCR, Nasal     Status: None   Collection Time: 08/20/22  5:49 PM   Specimen: Nasal Mucosa; Nasal Swab  Result Value Ref Range Status   MRSA by PCR Next Gen NOT DETECTED NOT DETECTED Final    Comment: (NOTE) The GeneXpert MRSA Assay (FDA approved for NASAL specimens only), is one component of a comprehensive MRSA colonization surveillance program. It is not intended to diagnose MRSA infection nor to guide or monitor treatment for MRSA infections. Test performance is not FDA approved in patients less than 44 years old. Performed at [facility], [place] [address] [place], [place] [address]   Aerobic/Anaerobic Culture w Gram Stain (surgical/deep wound)     Status: None   Collection Time: 08/22/22 11:24 AM   Specimen: PATH Cytology Peritoneal fluid  Result Value Ref Range Status   Specimen Description   Final    PERITONEAL Performed at [facility] [address], [place], [place] [address]    Special Requests   Final    NONE Performed at [facility], [place] [address], [place], [place] [address]    Gram Stain   Final    WBC PRESENT,BOTH PMN AND MONONUCLEAR NO ORGANISMS  SEEN CYTOSPIN SMEAR    Culture   Final    No growth aerobically or anaerobically. Performed at [facility], [place] [address] [place], [place] [address]    Report Status 08/27/2022 FINAL  Final     Radiology Studies: DG Abd Portable 1V  Result Date: 08/29/2022 CLINICAL DATA:  NGT placement EXAM: PORTABLE ABDOMEN - 1 VIEW COMPARISON:  08/26/2022 FINDINGS: Limited image of the upper abdomen demonstrates NG tube superimposed with stomach, below the diaphragm. IMPRESSION: NGT in place. Electronically Signed   By: Sammie Bench M.D.   On: 08/29/2022 12:34   [place] EKG SITE RITE  Result Date: 08/28/2022 If Site Rite image not attached, placement could not be confirmed due to current cardiac rhythm.   Scheduled Meds:  Benzocaine   Mouth/Throat Once   bisacodyl  10 mg Rectal Daily   Chlorhexidine Gluconate Cloth  6 each Topical Daily   fentaNYL       insulin aspart  0-15 Units Subcutaneous Q4H   insulin aspart  0-9 Units Subcutaneous Q4H   lip balm   Topical BID   metoCLOPramide (REGLAN) injection  10 mg Intravenous Q6H   metoprolol tartrate  5 mg Intravenous Q6H   midazolam       pantoprazole (PROTONIX) IV  40 mg Intravenous Q12H   simethicone  80 mg Oral QID   Continuous Infusions:  sodium chloride Stopped (08/25/22 0520)   sodium chloride 25 mL/hr at 08/29/22 1808   sodium chloride     albumin human 25 g (08/30/22 0555)   ondansetron (ZOFRAN) IV     potassium chloride     TPN ADULT (ION) 25 mL/hr at 08/29/22 1716   TPN ADULT (ION)       LOS: 10 days   Shelly Coss, MD [facility] P2/21/2024, 9:43 AM

## 2022-08-30 NOTE — Procedures (Signed)
  Procedure:  CT core omental biopsy   Preprocedure diagnosis: The encounter diagnosis was Ischemic colitis ([place]).  Postprocedure diagnosis: same EBL:    minimal Complications:   none immediate  See full dictation in [employer].  Dillard Cannon MD Main # [phone] Pager  [phone] Mobile [phone]

## 2022-08-31 DIAGNOSIS — R112 Nausea with vomiting, unspecified: Secondary | ICD-10-CM | POA: Diagnosis not present

## 2022-08-31 DIAGNOSIS — R197 Diarrhea, unspecified: Secondary | ICD-10-CM | POA: Diagnosis not present

## 2022-08-31 LAB — COMPREHENSIVE METABOLIC PANEL
ALT: 16 U/L (ref 0–44)
AST: 27 U/L (ref 15–41)
Albumin: 4.4 g/dL (ref 3.5–5.0)
Alkaline Phosphatase: 35 U/L — ABNORMAL LOW (ref 38–126)
Anion gap: 9 (ref 5–15)
BUN: 17 mg/dL (ref 8–23)
CO2: 31 mmol/L (ref 22–32)
Calcium: 8.9 mg/dL (ref 8.9–10.3)
Chloride: 106 mmol/L (ref 98–111)
Creatinine, Ser: 0.95 mg/dL (ref 0.44–1.00)
GFR, Estimated: >60 mL/min (ref >60)
Glucose, Bld: 139 mg/dL — ABNORMAL HIGH (ref 70–99)
Potassium: 3.8 mmol/L (ref 3.5–5.1)
Sodium: 146 mmol/L — ABNORMAL HIGH (ref 135–145)
Total Bilirubin: 1.4 mg/dL — ABNORMAL HIGH (ref 0.3–1.2)
Total Protein: 6.2 g/dL — ABNORMAL LOW (ref 6.5–8.1)

## 2022-08-31 LAB — CBC
HCT: 34.3 % — ABNORMAL LOW (ref 36.0–46.0)
Hemoglobin: 10.5 g/dL — ABNORMAL LOW (ref 12.0–15.0)
MCH: 31.4 pg (ref 26.0–34.0)
MCHC: 30.6 g/dL (ref 30.0–36.0)
MCV: 102.7 fL — ABNORMAL HIGH (ref 80.0–100.0)
Platelets: 210 10*3/uL (ref 150–400)
RBC: 3.34 MIL/uL — ABNORMAL LOW (ref 3.87–5.11)
RDW: 13 % (ref 11.5–15.5)
WBC: 12.1 10*3/uL — ABNORMAL HIGH (ref 4.0–10.5)
nRBC: 0 % (ref 0.0–0.2)

## 2022-08-31 LAB — GLUCOSE, CAPILLARY
Glucose-Capillary: 124 mg/dL — ABNORMAL HIGH (ref 70–99)
Glucose-Capillary: 124 mg/dL — ABNORMAL HIGH (ref 70–99)
Glucose-Capillary: 133 mg/dL — ABNORMAL HIGH (ref 70–99)
Glucose-Capillary: 138 mg/dL — ABNORMAL HIGH (ref 70–99)
Glucose-Capillary: 141 mg/dL — ABNORMAL HIGH (ref 70–99)
Glucose-Capillary: 147 mg/dL — ABNORMAL HIGH (ref 70–99)
Glucose-Capillary: 146 mg/dL — ABNORMAL HIGH (ref 70–99)

## 2022-08-31 LAB — MAGNESIUM: Magnesium: 2.2 mg/dL (ref 1.7–2.4)

## 2022-08-31 LAB — TRIGLYCERIDES: Triglycerides: 105 mg/dL (ref <150)

## 2022-08-31 LAB — PHOSPHORUS: Phosphorus: 2.8 mg/dL (ref 2.5–4.6)

## 2022-08-31 MED ORDER — SODIUM CHLORIDE 0.9 % IV SOLN: INTRAVENOUS | Status: DC

## 2022-08-31 MED ORDER — TRACE MINERALS CU-MN-SE-ZN 300-55-60-3000 MCG/ML IV SOLN
INTRAVENOUS | Status: DC
  Filled 2022-08-31: qty 624.8

## 2022-08-31 MED ORDER — HYDRALAZINE HCL 20 MG/ML IJ SOLN
10.0000 mg | Freq: Four times a day (QID) | INTRAMUSCULAR | Status: DC | PRN
  Administered 2022-09-02: 10 mg via INTRAVENOUS
  Filled 2022-08-31: qty 1

## 2022-08-31 MED ORDER — POTASSIUM CHLORIDE 10 MEQ/50ML IV SOLN
10.0000 meq | INTRAVENOUS | Status: AC
  Administered 2022-08-31 (×2): 10 meq via INTRAVENOUS
  Filled 2022-08-31 (×2): qty 50

## 2022-08-31 NOTE — Plan of Care (Signed)
  Problem: Pain Managment: Goal: General experience of comfort will improve Outcome: Progressing   Problem: Safety: Goal: Ability to remain free from injury will improve Outcome: Progressing   

## 2022-08-31 NOTE — Progress Notes (Signed)
PHARMACY - TOTAL PARENTERAL NUTRITION CONSULT NOTE   Indication:  Intolerance to enteral feeding  Patient Measurements: Height: 5' 5"$  (165.1 cm) Weight: 107.7 kg (237 lb 7 oz) IBW/kg (Calculated) : 57 TPN AdjBW (KG): 67.7 Body mass index is 39.51 kg/m.  Assessment:  Pt is a 4 yoF admitted on 2/11 with abdominal pain, N/V ongoing for several weeks. Imaging revealed gastric pneumatosis, penumobilia - no indication for surgery at this time. Pt has had minimal PO intake > 1 week. Pharmacy consulted to initiate TPN.  Glucose / Insulin: T2DM. CBG goal < 150 -CBG range: 120-139 (at goal). mSSI q4h - 8 units/24 hrs Electrolytes: Na (146) slightly elevated, K (3.8) on lower end of normal.  -Ca (8.9) with normal albumin - however, expect Alb falsely elevated while receiving albumin q8h. Previous albumin low at 2.3 on 2/16. Given concern for malignancy, will err on the side of caution and use albumin value of 2.3 for calculated CorrCa of 10.3.  -All other lytes WNL Renal: SCr, BUN WNL Hepatic: T.bili slightly elevated. Alk Phos, LFTs WNL. TG, Alb WNL (note: pt receiving albumin TID) Intake / Output; MIVF: mIVF: NS @ 15 mL/hr -UOP: 2350 mL. Emesis/NG output: 300 mL (pt agreed to placement on 2/20) GI Imaging: -2/11 CT Abd: Portal vein and mesenteric venous gas. Mild pneumatosis in the gastric wall without evidence of gastric wall thickening. Moderate wall thickening involving transverse colon (differential includes infectious or ischemic colitis or neoplasm). Moderate ascites, cholethiasis.  -2/18 CT Abd: Persistent mild ascites (infectious or neoplastic etiology?). Improved appearance of transverse colon, no evidence of perforation. Resolution of gastric pneumatosis as well as portal and mesenteric venous gas. GI Surgeries / Procedures:  -2/21: CT core omental biopsy  Central access: Double lumen PICC placed 2/20 TPN start date: 2/20   Nutritional Goals: Goal TPN rate is 55 mL/hr (provides 93  g of protein and 1790 kcals per day)  Discussed with MD on 2/20: Using Clinisol 15% to concentrate TPN to be able to meet nutritional needs with minimal volume. Discussed again with MD on 2/21 - reports pt remains volume overloaded with ascites and crackles. Planning to continue with concentrated TPN.  RD Assessment: Estimated Needs Total Energy Estimated Needs: 1800-2000 kcals Total Protein Estimated Needs: 80-100 grams Total Fluid Estimated Needs: >/= 1.7L  Current Nutrition:  NPO except for sips with meds, ice chips TPN  Plan:  Now:  KCl 10 mEq/50 mL x 2 doses  At 1800: Increase TPN to goal rate of 55 mL/hr at 1800 Electrolytes in TPN: Reduce Na, Remove Ca Na 40 mEq/L, K 40mq/L, Ca 0 mEq/L, Mg 584m/L, and Phos 1558m/L.  Cl:Ac 1:1 Add standard MVI and trace elements to TPN.  Thiamine in TPN x5 days (2/20 > 2/24) Continue SSI at moderate q4h and adjust as needed Reduce MIVF to KVO at 1800 Monitor TPN labs on Mon/Thurs. Recheck electrolytes with AM labs tomorrow.   MarLenis NoonharmD 08/31/2022,7:15 AM

## 2022-08-31 NOTE — Care Management Important Message (Signed)
Important Message  Patient Details IM Letter given. Name: [name] MRN: [id number] Date of Birth: June 08, 1951   Medicare Important Message Given:  Yes     Kerin Salen 08/31/2022, 1:13 PM

## 2022-08-31 NOTE — Progress Notes (Signed)
Subjective/Chief Complaint: No new complaints today.  Thinks she is feeling better overall but a bit anxious.   Objective: Vital signs in last 24 hours: Temp:  [97.7 F (36.5 C)-98.2 F (36.8 C)] 97.7 F (36.5 C) (02/22 0125) Pulse Rate:  [106-124] 106 (02/22 0125) Resp:  [16-21] 18 (02/22 0125) BP: (158-174)/(88-107) 158/88 (02/22 0125) SpO2:  [95 %-100 %] 96 % (02/22 0125) Weight:  [107.7 kg] 107.7 kg (02/22 0406) Last BM Date : 08/20/22  Intake/Output from previous day: 02/21 0701 - 02/22 0700 In: 1604.6 [P.O.:10; I.V.:1295.4; IV Piggyback:299.2] Out: 2950 [Urine:2350; Emesis/NG output:600] Intake/Output this shift: No intake/output data recorded.  Ab distended, tight despite NGT placement, mild diffuse tenderness, no peritonitis or guarding.  NGT placed with 600cc of bilious output in 24/hrs.  Lab Results:  Recent Labs    08/30/22 1051 08/31/22 0448  WBC 12.5* 12.1*  HGB 10.4* 10.5*  HCT 33.8* 34.3*  PLT 274 210   BMET Recent Labs    08/30/22 0141 08/31/22 0448  NA 141 146*  K 3.7 3.8  CL 108 106  CO2 28 31  GLUCOSE 140* 139*  BUN 19 17  CREATININE 0.82 0.95  CALCIUM 8.3* 8.9   PT/INR No results for input(s): "LABPROT", "INR" in the last 72 hours. ABG Recent Labs    08/29/22 0950  PHART 7.46*  HCO3 28.5*    Studies/Results: DG CHEST PORT 1 VIEW  Result Date: 08/30/2022 CLINICAL DATA:  72 year old female with history of cough. EXAM: PORTABLE CHEST - 1 VIEW COMPARISON:  09/04/2010 FINDINGS: Apical projection. The mediastinal contours are within normal limits. No cardiomegaly. Right upper extremity PICC place with catheter tip terminating near the cavoatrial junction. Gastric decompression tube in place, terminating off the inferior aspect this image. Low lung volumes. Mild prominence of pulmonary vasculature without evidence of overt pulmonary edema. No focal consolidation, pleural effusion, pneumothorax. No acute osseous abnormality. IMPRESSION:  Low lung volumes with mild prominence of pulmonary vasculature, no evidence of overt pulmonary edema. Electronically Signed   By: Ruthann Cancer M.D.   On: 08/30/2022 13:31   CT ABDOMINAL MASS BIOPSY  Result Date: 08/30/2022 CLINICAL DATA:  Ascites and omental stranding. Atypical cells on paracentesis cytology. EXAM: CT GUIDED CORE BIOPSY OF OMENTUM ANESTHESIA/SEDATION: Intravenous Fentanyl 62mg and Versed 0.557mwere administered as conscious sedation during continuous monitoring of the patient's level of consciousness and physiological / cardiorespiratory status by the radiology RN, with a total moderate sedation time of 10 minutes. PROCEDURE: The procedure risks, benefits, and alternatives were explained to the patient. Questions regarding the procedure were encouraged and answered. The patient understands and consents to the procedure. select axial scans through the abdomen were obtained. Omentum localized, appropriate skin entry site was determined and marked. The operative field was prepped with chlorhexidinein a sterile fashion, and a sterile drape was applied covering the operative field. A sterile gown and sterile gloves were used for the procedure. Local anesthesia was provided with 1% Lidocaine. Under CT fluoroscopic guidance, a 17 gauge trocar needle was advanced to the margin of the lesion. Once needle tip position was confirmed, coaxial 18-gauge core biopsy samples were obtained, submitted in saline to surgical pathology. The guide needle was removed. Postprocedure scans show no hemorrhage or other apparent complication. The patient tolerated the procedure well. COMPLICATIONS: None immediate FINDINGS: Omental localized. Representative core biopsy samples obtained as above. IMPRESSION: Technically successful CT-guided core biopsy, omentum. RADIATION DOSE REDUCTION: This exam was performed according to the departmental dose-optimization program which  includes automated exposure control, adjustment of  the mA and/or kV according to patient size and/or use of iterative reconstruction technique. Electronically Signed   By: Lucrezia Europe M.D.   On: 08/30/2022 10:49   DG Abd Portable 1V  Result Date: 08/29/2022 CLINICAL DATA:  NGT placement EXAM: PORTABLE ABDOMEN - 1 VIEW COMPARISON:  08/26/2022 FINDINGS: Limited image of the upper abdomen demonstrates NG tube superimposed with stomach, below the diaphragm. IMPRESSION: NGT in place. Electronically Signed   By: Sammie Bench M.D.   On: 08/29/2022 12:34    Anti-infectives: Anti-infectives (From admission, onward)    Start     Dose/Rate Route Frequency Ordered Stop   08/20/22 2000  piperacillin-tazobactam (ZOSYN) IVPB 3.375 g        3.375 g 12.5 mL/hr over 240 Minutes Intravenous Every 8 hours 08/20/22 1325 08/25/22 1918   08/20/22 1130  piperacillin-tazobactam (ZOSYN) IVPB 3.375 g        3.375 g 100 mL/hr over 30 Minutes Intravenous  Once 08/20/22 1126 08/20/22 1223       Assessment/Plan: Abdominal pain, pneumobilia, gastric pneumatosis (resolved), N/V/D - Reports her pain is improved overall, but still present. No peritonitis on exam. No indication for emergency surgery currently.  -cytology with atypical cells, further eval needed and patient underwent omental biopsy yesterday by IR.  Path pending. -CA19-9, CEA, normal but CA-125 is quite elevated over 600. -Suspect she has a malignancy until proven otherwise. -cont NPO/NGT for now. -cont TNA   FEN -NPO, NGT/TPN VTE - SCDs, ok for chemical prophylaxis from our standpoint ID - zosyn 2/11 >> 2/20 Foley - per primary    DM Hx of seizures HTN IBS Morbid obesity GAD AKI - resolved   I reviewed hospitalist notes, last 24 h vitals and pain scores, last 48 h intake and output, last 24 h labs and trends, and last 24 h imaging results.  Discussed with primary team.   Henreitta Cea, PA-C 08/31/2022

## 2022-08-31 NOTE — Progress Notes (Signed)
PROGRESS NOTE  [name]  [id number] DOB: [birth year] DOA: 08/20/2022 PCP: London Pepper, MD   Brief Narrative:  Patient is a 72 year old female with history of obesity, diabetes type 2, anxiety, depression, CVA, hypertension, seizure disorder who presented with worsening abdominal pain for 3 weeks, decreased appetite, unintentional weight loss, diarrhea, vomiting.  On presentation, she had leukocytosis and elevated lactate of 2.2.  CT abdomen/pelvis showed possible infectious or ischemic colitis indicated by colonic wall thickening, suspected peritoneal carcinomatosis due to presence of ascites, omental stranding, mesenteric venous gas and pneumatosis.  General surgery consulted.  Underwent paracentesis twice.    IR did omental biopsy, elevated CA125 for suspected malignancy.  Remains on TPN, NG tube  Assessment & Plan:  Principal Problem:   Nausea, vomiting, and diarrhea Active Problems:   Hypertension   AKI (acute kidney injury) ([place])   Morbid obesity ([place])   Hyperglycemia   Ascites   Pneumobilia   Diabetes mellitus type 2, noninsulin dependent (HCC)   Generalized anxiety disorder   Cholelithiasis   Slow rate of speech - history of CVA 2012   Abdominal pain, diffuse  Abdomen pain/pneumobilia/gastric pneumatosis: Presented with nausea, vomiting, diarrhea, abdominal pain, weight loss.  Found to have ascites, underwent paracentesis twice.  Imaging showed possible colitis/peritoneal carcinomatosis, omental stranding.  Underwent paracentesis twice.  Cytology showed atypical cells.  CA 19-9, CEA normal but CEA-125 elevated.  IR did omental biopsy on 2/21, biopsy report pending.  High suspicion for malignancy. Currently n.p.o., NG tube in place. General surgery closely following.  Continue pain management, supportive care.  Antibiotics discontinued. Continue on TPN. Abdomen remains distended, with no bowel sounds  Macrocytosis/vitamin B12 deficiency: Low vitamin B12.  Given a dose of  IM vitamin B12 injection.  Continue oral supplementation on dc when able to take by mouth  AKI on CKD stage IIIa: Currently kidney function at baseline.  Baseline creatinine around 1.1.  Losartan, hydrochlorothiazide on hold  Hypertension/sinus tachycardia: Remains hypertensive.  On IV metoprolol.  Takes metoprolol, losartan, hydrochlorothiazide at home.Added prn hydralazine  Acute hypoxic respiratory failure: Currently requiring 2 to 4 L of oxygen per minute.  ABG done on 2/20 shows normal pCO2, normal O2.  Chest x-ray done on 2/21 showed  low lung volumes with mild prominence of pulmonary vasculature .  Patient had crackles, elevated BNP.  Given a dose of Lasix 40 mg once with significant output.  Elevated liver enzymes/hyperbilirubinemia: Suspected to be from hypoperfusion.  Currently improved  Diabetes type 2: Currently sugars are stable.  Continue sliding scale and monitor  Anxiety: On as needed Xanax  History of prior CVA  Morbid obesity: BMI 39    Nutrition Problem: Inadequate oral intake Etiology: inability to eat    DVT prophylaxis:SCDs Start: 08/20/22 1402     Code Status: Full Code  Family Communication: Called and discussed with sister on phone on 2/22  Patient status:Inpatient  Patient is from :Home  Anticipated discharge [id number]  Estimated DC date:note sure, needs to have complete workup before discharge, needs surgical clearance before discharge   Consultants: Surgery and IR   Procedures: Paracentesis, omental biopsy  Antimicrobials:  Anti-infectives (From admission, onward)    Start     Dose/Rate Route Frequency Ordered Stop   08/20/22 2000  piperacillin-tazobactam (ZOSYN) IVPB 3.375 g        3.375 g 12.5 mL/hr over 240 Minutes Intravenous Every 8 hours 08/20/22 1325 08/25/22 1918   08/20/22 1130  piperacillin-tazobactam (ZOSYN) IVPB 3.375 g  3.375 g 100 mL/hr over 30 Minutes Intravenous  Once 08/20/22 1126 08/20/22 1223        Subjective: Patient seen and examined at bedside today.  Hemodynamically stable.  Lying in bed.  She feels better than yesterday.  Abdomen remains distended.  She has not passed gas.  Denies any shortness of breath today.  Objective: Vitals:   08/30/22 1544 08/30/22 2038 08/31/22 0125 08/31/22 0406  BP: (!) 174/97 (!) 170/102 (!) 158/88   Pulse: (!) 113 (!) 121 (!) 106   Resp:  19 18   Temp:  98.2 F (36.8 C) 97.7 F (36.5 C)   TempSrc:  Oral Oral   SpO2: 100% 99% 96%   Weight:    107.7 kg  Height:        Intake/Output Summary (Last 24 hours) at 08/31/2022 1057 Last data filed at 08/31/2022 1022 Gross per 24 hour  Intake 1704.55 ml  Output 3100 ml  Net -1395.45 ml   Filed Weights   08/29/22 0500 08/30/22 0500 08/31/22 0406  Weight: 105.8 kg 106.5 kg 107.7 kg    Examination:   General exam: Overall comfortable, not in distress,obese HEENT: PERRL, NG tube Respiratory system: Diminished air sounds on the bases no crackles or wheezes  Cardiovascular system: Sinus tachycardia Gastrointestinal system: Abdomen is nondistended, tense with no bowel sounds Central nervous system: Alert and oriented Extremities: No edema, no clubbing ,no cyanosis,picc line on right arm Skin: No rashes, no ulcers,no icterus     Data Reviewed: I have personally reviewed following labs and imaging studies  CBC: Recent Labs  Lab 08/26/22 0319 08/27/22 0828 08/28/22 0316 08/30/22 1051 08/31/22 0448  WBC 11.4* 13.3* 13.7* 12.5* 12.1*  HGB 11.7* 10.8* 11.3* 10.4* 10.5*  HCT 36.9 34.0* 36.0 33.8* 34.3*  MCV 100.0 101.2* 100.6* 101.2* 102.7*  PLT 330 254 255 274 [id number]   Basic Metabolic Panel: Recent Labs  Lab 08/27/22 0828 08/28/22 1614 08/29/22 0331 08/30/22 0141 08/31/22 0448  NA 142 145 142 141 146*  K 3.7 4.2 3.8 3.7 3.8  CL 103 105 107 108 106  CO2 26 28 26 28 31  $ GLUCOSE 94 98 109* 140* 139*  BUN 17 16 19 19 17  $ CREATININE 0.98 0.97 0.94 0.82 0.95  CALCIUM 8.5* 8.5* 8.4*  8.3* 8.9  MG  --   --  2.2 2.2 2.2  PHOS  --   --  2.9 2.6 2.8     Recent Results (from the past 240 hour(s))  Aerobic/Anaerobic Culture w Gram Stain (surgical/deep wound)     Status: None   Collection Time: 08/22/22 11:24 AM   Specimen: PATH Cytology Peritoneal fluid  Result Value Ref Range Status   Specimen Description   Final    PERITONEAL Performed at [facility], [place] [address] [place], [place] [address]    Special Requests   Final    NONE Performed at [facility], [place] [address], [place], [place] [address]    Gram Stain   Final    WBC PRESENT,BOTH PMN AND MONONUCLEAR NO ORGANISMS SEEN CYTOSPIN SMEAR    Culture   Final    No growth aerobically or anaerobically. Performed at [facility], [place] [address] [place],  [address]    Report Status 08/27/2022 FINAL  Final     Radiology Studies: DG CHEST PORT 1 VIEW  Result Date: 08/30/2022 CLINICAL DATA:  72 year old female with history of cough. EXAM: PORTABLE CHEST - 1 VIEW COMPARISON:  09/04/2010 FINDINGS: Apical projection. The mediastinal contours are within normal limits. No cardiomegaly. Right upper extremity PICC place with catheter tip terminating near the cavoatrial junction. Gastric decompression tube in place, terminating off the inferior aspect this image. Low lung volumes. Mild prominence of pulmonary vasculature without evidence of overt pulmonary edema. No focal consolidation, pleural effusion, pneumothorax. No acute osseous abnormality. IMPRESSION: Low lung volumes with mild prominence of pulmonary vasculature, no evidence of overt pulmonary edema. Electronically Signed   By: Ruthann Cancer M.D.   On: 08/30/2022 13:31   CT ABDOMINAL MASS BIOPSY  Result Date: 08/30/2022 CLINICAL DATA:  Ascites and omental stranding. Atypical cells on paracentesis cytology. EXAM: CT GUIDED CORE BIOPSY OF OMENTUM ANESTHESIA/SEDATION: Intravenous Fentanyl 43mg and Versed 0.573mwere  administered as conscious sedation during continuous monitoring of the patient's level of consciousness and physiological / cardiorespiratory status by the radiology RN, with a total moderate sedation time of 10 minutes. PROCEDURE: The procedure risks, benefits, and alternatives were explained to the patient. Questions regarding the procedure were encouraged and answered. The patient understands and consents to the procedure. select axial scans through the abdomen were obtained. Omentum localized, appropriate skin entry site was determined and marked. The operative field was prepped with chlorhexidinein a sterile fashion, and a sterile drape was applied covering the operative field. A sterile gown and sterile gloves were used for the procedure. Local anesthesia was provided with 1% Lidocaine. Under CT fluoroscopic guidance, a 17 gauge trocar needle was advanced to the margin of the lesion. Once needle tip position was confirmed, coaxial 18-gauge core biopsy samples were obtained, submitted in saline to surgical pathology. The guide needle was removed. Postprocedure scans show no hemorrhage or other apparent complication. The patient tolerated the procedure well. COMPLICATIONS: None immediate FINDINGS: Omental localized. Representative core biopsy samples obtained as above. IMPRESSION: Technically successful CT-guided core biopsy, omentum. RADIATION DOSE REDUCTION: This exam was performed according to the departmental dose-optimization program which includes automated exposure control, adjustment of the mA and/or kV according to patient size and/or use of iterative reconstruction technique. Electronically Signed   By: D Lucrezia Europe.D.   On: 08/30/2022 10:49   DG Abd Portable 1V  Result Date: 08/29/2022 CLINICAL DATA:  NGT placement EXAM: PORTABLE ABDOMEN - 1 VIEW COMPARISON:  08/26/2022 FINDINGS: Limited image of the upper abdomen demonstrates NG tube superimposed with stomach, below the diaphragm. IMPRESSION: NGT  in place. Electronically Signed   By: JoSammie Bench.D.   On: 08/29/2022 12:34    Scheduled Meds:  Benzocaine   Mouth/Throat Once   bisacodyl  10 mg Rectal Daily   Chlorhexidine Gluconate Cloth  6 each Topical Daily   insulin aspart  0-15 Units Subcutaneous Q4H   lip balm   Topical BID   metoCLOPramide (REGLAN) injection  10 mg Intravenous Q6H   metoprolol tartrate  5 mg Intravenous Q6H   pantoprazole (PROTONIX) IV  40 mg Intravenous Q12H   simethicone  80 mg Oral QID   Continuous Infusions:  sodium chloride Stopped (08/25/22 0520)   sodium chloride 15 mL/hr at 08/31/22 0601   sodium chloride     albumin human 25 g (08/31/22 0552)   ondansetron (ZOFRAN) IV     potassium chloride 10 mEq (08/31/22 1022)   TPN ADULT (ION) 35 mL/hr at 08/30/22 1807   TPN ADULT (ION)       LOS: 11 days   AmShelly CossMD [facility] P2/22/2024, 10:57 AM

## 2022-08-31 NOTE — TOC Progression Note (Signed)
Transition of Care [facility]) - Progression Note    Patient Details  Name: [name] MRN: [id number] Date of Birth: [birth year]  Transition of Care [facility]) CM/SW Contact  Servando Snare, [place] Phone Number: 08/31/2022, 9:39 AM  Clinical Narrative:   TOC following for disposition.          Expected Discharge Plan and Services                                               Social Determinants of Health (SDOH) Interventions SDOH Screenings   Food Insecurity: No Food Insecurity (08/21/2022)  Housing: Low Risk  (08/21/2022)  Transportation Needs: No Transportation Needs (08/21/2022)  Utilities: Not At Risk (08/21/2022)  Tobacco Use: Medium Risk (08/30/2022)    Readmission Risk Interventions     No data to display

## 2022-09-01 ENCOUNTER — Inpatient Hospital Stay (HOSPITAL_COMMUNITY): Payer: Medicare HMO

## 2022-09-01 ENCOUNTER — Encounter (HOSPITAL_COMMUNITY): Payer: Self-pay | Admitting: Internal Medicine

## 2022-09-01 ENCOUNTER — Inpatient Hospital Stay: Payer: Self-pay

## 2022-09-01 DIAGNOSIS — I2699 Other pulmonary embolism without acute cor pulmonale: Secondary | ICD-10-CM

## 2022-09-01 DIAGNOSIS — R197 Diarrhea, unspecified: Secondary | ICD-10-CM | POA: Diagnosis not present

## 2022-09-01 DIAGNOSIS — R112 Nausea with vomiting, unspecified: Secondary | ICD-10-CM | POA: Diagnosis not present

## 2022-09-01 LAB — GLUCOSE, CAPILLARY
Glucose-Capillary: 118 mg/dL — ABNORMAL HIGH (ref 70–99)
Glucose-Capillary: 127 mg/dL — ABNORMAL HIGH (ref 70–99)
Glucose-Capillary: 136 mg/dL — ABNORMAL HIGH (ref 70–99)
Glucose-Capillary: 149 mg/dL — ABNORMAL HIGH (ref 70–99)
Glucose-Capillary: 151 mg/dL — ABNORMAL HIGH (ref 70–99)
Glucose-Capillary: 142 mg/dL — ABNORMAL HIGH (ref 70–99)
Glucose-Capillary: 169 mg/dL — ABNORMAL HIGH (ref 70–99)
Glucose-Capillary: 133 mg/dL — ABNORMAL HIGH (ref 70–99)
Glucose-Capillary: 158 mg/dL — ABNORMAL HIGH (ref 70–99)

## 2022-09-01 LAB — PHOSPHORUS: Phosphorus: 2.3 mg/dL — ABNORMAL LOW (ref 2.5–4.6)

## 2022-09-01 LAB — SURGICAL PATHOLOGY

## 2022-09-01 LAB — BASIC METABOLIC PANEL
Anion gap: 11 (ref 5–15)
BUN: 11 mg/dL (ref 8–23)
CO2: 28 mmol/L (ref 22–32)
Calcium: 9 mg/dL (ref 8.9–10.3)
Chloride: 108 mmol/L (ref 98–111)
Creatinine, Ser: 0.85 mg/dL (ref 0.44–1.00)
GFR, Estimated: >60 mL/min (ref >60)
Glucose, Bld: 133 mg/dL — ABNORMAL HIGH (ref 70–99)
Potassium: 4.2 mmol/L (ref 3.5–5.1)
Sodium: 147 mmol/L — ABNORMAL HIGH (ref 135–145)

## 2022-09-01 LAB — D-DIMER, QUANTITATIVE: D-Dimer, Quant: >20 ug/mL-FEU — ABNORMAL HIGH (ref 0.00–0.50)

## 2022-09-01 LAB — PROCALCITONIN: Procalcitonin: 0.19 ng/mL

## 2022-09-01 LAB — MRSA NEXT GEN BY PCR, NASAL: MRSA by PCR Next Gen: NOT DETECTED

## 2022-09-01 LAB — MAGNESIUM: Magnesium: 1.8 mg/dL (ref 1.7–2.4)

## 2022-09-01 MED ORDER — LIDOCAINE HCL 1 % IJ SOLN
INTRAMUSCULAR | Status: AC
  Administered 2022-09-01: 10 mL
  Filled 2022-09-01: qty 20

## 2022-09-01 MED ORDER — IOHEXOL 350 MG/ML SOLN
80.0000 mL | Freq: Once | INTRAVENOUS | Status: AC | PRN
  Administered 2022-09-01: 75 mL via INTRAVENOUS

## 2022-09-01 MED ORDER — SODIUM CHLORIDE 0.9% FLUSH: 10.0000 mL | INTRAVENOUS | Status: DC | PRN

## 2022-09-01 MED ORDER — DEXTROSE 10 % IV SOLN: INTRAVENOUS | Status: AC

## 2022-09-01 MED ORDER — POTASSIUM PHOSPHATES 15 MMOLE/5ML IV SOLN
30.0000 mmol | Freq: Once | INTRAVENOUS | Status: AC
  Administered 2022-09-01: 30 mmol via INTRAVENOUS
  Filled 2022-09-01: qty 10

## 2022-09-01 MED ORDER — VANCOMYCIN HCL 1250 MG/250ML IV SOLN: 1250.0000 mg | Freq: Every day | INTRAVENOUS | Status: DC

## 2022-09-01 MED ORDER — SODIUM CHLORIDE 0.9% FLUSH
10.0000 mL | Freq: Two times a day (BID) | INTRAVENOUS | Status: DC
  Administered 2022-09-01: 20 mL
  Administered 2022-09-02 – 2022-09-05 (×5): 10 mL

## 2022-09-01 MED ORDER — VANCOMYCIN HCL IN DEXTROSE 1-5 GM/200ML-% IV SOLN
1000.0000 mg | Freq: Once | INTRAVENOUS | Status: AC
  Administered 2022-09-01: 1000 mg via INTRAVENOUS
  Filled 2022-09-01: qty 200

## 2022-09-01 MED ORDER — MAGNESIUM SULFATE 2 GM/50ML IV SOLN
2.0000 g | Freq: Once | INTRAVENOUS | Status: AC
  Administered 2022-09-01: 2 g via INTRAVENOUS
  Filled 2022-09-01: qty 50

## 2022-09-01 MED ORDER — SODIUM CHLORIDE (PF) 0.9 % IJ SOLN
INTRAMUSCULAR | Status: AC
  Administered 2022-09-01: 10 mL
  Filled 2022-09-01: qty 50

## 2022-09-01 MED ORDER — HEPARIN BOLUS VIA INFUSION
6000.0000 [IU] | Freq: Once | INTRAVENOUS | Status: AC
  Administered 2022-09-01: 6000 [IU] via INTRAVENOUS
  Filled 2022-09-01: qty 6000

## 2022-09-01 MED ORDER — SODIUM CHLORIDE 0.9 % IV SOLN
2.0000 g | Freq: Three times a day (TID) | INTRAVENOUS | Status: DC
  Administered 2022-09-01 – 2022-09-03 (×6): 2 g via INTRAVENOUS
  Filled 2022-09-01 (×6): qty 12.5

## 2022-09-01 MED ORDER — HEPARIN (PORCINE) 25000 UT/250ML-% IV SOLN
1900.0000 [IU]/h | INTRAVENOUS | Status: DC
  Administered 2022-09-01: 1500 [IU]/h via INTRAVENOUS
  Administered 2022-09-02: 1800 [IU]/h via INTRAVENOUS
  Administered 2022-09-02: 2000 [IU]/h via INTRAVENOUS
  Administered 2022-09-03: 1900 [IU]/h via INTRAVENOUS
  Filled 2022-09-01 (×5): qty 250

## 2022-09-01 MED ORDER — TRACE MINERALS CU-MN-SE-ZN 300-55-60-3000 MCG/ML IV SOLN
INTRAVENOUS | Status: AC
  Filled 2022-09-01: qty 624.8

## 2022-09-01 NOTE — Progress Notes (Addendum)
PROGRESS NOTE  [name]  [id number] DOB: [birth year] DOA: 08/20/2022 PCP: London Pepper, MD   Brief Narrative:  Patient is a 72 year old female with history of obesity, diabetes type 2, anxiety, depression, CVA, hypertension, seizure disorder who presented with worsening abdominal pain for 3 weeks, decreased appetite, unintentional weight loss, diarrhea, vomiting.  On presentation, she had leukocytosis and elevated lactate of 2.2. CT abdomen/pelvis showed possible infectious or ischemic colitis indicated by colonic wall thickening, suspected peritoneal carcinomatosis due to presence of ascites, omental stranding, mesenteric venous gas and pneumatosis.  General surgery consulted.  Underwent paracentesis twice.    IR did omental biopsy, elevated CA125 for suspected malignancy.  Remains on TPN, NG tube  Assessment & Plan:  Principal Problem:   Nausea, vomiting, and diarrhea Active Problems:   Hypertension   AKI (acute kidney injury) ([place])   Morbid obesity ([place])   Hyperglycemia   Ascites   Pneumobilia   Diabetes mellitus type 2, noninsulin dependent (HCC)   Generalized anxiety disorder   Cholelithiasis   Slow rate of speech - history of CVA 2012   Abdominal pain, diffuse  Abdomen pain/pneumobilia/gastric pneumatosis: Presented with nausea, vomiting, diarrhea, abdominal pain, weight loss.  Found to have ascites, underwent paracentesis twice.  Imaging showed possible colitis/peritoneal carcinomatosis, omental stranding.  Underwent paracentesis twice.  Cytology showed atypical cells.  CA 19-9, CEA normal but CEA-125 elevated.  IR did omental biopsy on 2/21, biopsy report pending.  High suspicion for malignancy. Currently n.p.o., NG tube in place. General surgery closely following.  Continue pain management, supportive care.  Antibiotics discontinued. Continue on TPN. Abdomen remains distended, with no bowel sounds. Very high suspicion for malignancy though biopsies still pending, will  reach out to oncology. GI was also following earlier and recommending upper GI small bowel follow-through,will ask for follow up We will order for paracentesis today.  She is uncomfortable due to abdominal distention  Acute hypoxic respiratory failure: Currently requiring 2 to 4 L of oxygen per minute.  ABG done on 2/20 shows normal pCO2, normal O2.  Chest x-ray done on 2/21 showed  low lung volumes with mild prominence of pulmonary vasculature .  Patient had crackles, elevated BNP.  Given a dose of Lasix 40 mg once with significant output. Continues to complain of shortness of breath and she remains in mild sinus tachycardia.  Will check CT angiogram to rule out PE.  Macrocytosis/vitamin B12 deficiency: Low vitamin B12.  Given a dose of IM vitamin B12 injection.  Continue oral supplementation on dc when able to take by mouth  AKI on CKD stage IIIa: Currently kidney function at baseline.  Baseline creatinine around 1.1.  Losartan, hydrochlorothiazide on hold  Hypertension/sinus tachycardia: Remains hypertensive.  On IV metoprolol.  Takes metoprolol, losartan, hydrochlorothiazide at home.Added prn hydralazine  Elevated liver enzymes/hyperbilirubinemia: Suspected to be from hypoperfusion.  Currently improved  Hypophosphatemia: Currently being supplemented  Diabetes type 2: Currently sugars are stable.  Continue sliding scale and monitor  Anxiety: On as needed Xanax  History of prior CVA  Morbid obesity: BMI 39    Nutrition Problem: Inadequate oral intake Etiology: inability to eat    DVT prophylaxis:SCDs Start: 08/20/22 1402     Code Status: Full Code  Family Communication: Called and discussed with sister on phone on 2/23  Patient status:Inpatient  Patient is from :Home  Anticipated discharge [id number]  Estimated DC date:note sure, needs to have complete workup before discharge,still on NG tube,TPN   Consultants: Surgery and IR   Procedures:  Paracentesis, omental  biopsy  Antimicrobials:  Anti-infectives (From admission, onward)    Start     Dose/Rate Route Frequency Ordered Stop   08/20/22 2000  piperacillin-tazobactam (ZOSYN) IVPB 3.375 g        3.375 g 12.5 mL/hr over 240 Minutes Intravenous Every 8 hours 08/20/22 1325 08/25/22 1918   08/20/22 1130  piperacillin-tazobactam (ZOSYN) IVPB 3.375 g        3.375 g 100 mL/hr over 30 Minutes Intravenous  Once 08/20/22 1126 08/20/22 1223       Subjective: Patient seen and examined at bedside today.  She was little uncomfortable today with abdominal distention and some shortness of breath.  She does not look volume overloaded.  Lungs are almost clear on auscultation.  She is in mild sinus tachycardia.  She is maintaining her saturation on 2 L/min.  She pulled out her PICC line last night, new PICC line placed  Objective: Vitals:   08/31/22 1325 08/31/22 2141 09/01/22 0500 09/01/22 0546  BP: (!) 169/74 (!) 157/87  (!) 160/99  Pulse: (!) 107 (!) 109  (!) 108  Resp: '17 18  18  '$ Temp: 99.6 F (37.6 C) 99.1 F (37.3 C)  98.5 F (36.9 C)  TempSrc: Oral Oral  Oral  SpO2: 97% 97%  97%  Weight:   105.1 kg   Height:        Intake/Output Summary (Last 24 hours) at 09/01/2022 1048 Last data filed at 09/01/2022 0935 Gross per 24 hour  Intake 1472.7 ml  Output 2440 ml  Net -967.3 ml   Filed Weights   08/30/22 0500 08/31/22 0406 09/01/22 0500  Weight: 106.5 kg 107.7 kg 105.1 kg    Examination:   General exam: In mild to moderate distress.  Obese HEENT: NG tube Respiratory system: Diminished sounds on bases no wheezes or crackles  Cardiovascular system: Sinus tachycardia Gastrointestinal system: Abdomen is distended, non-tender.  No bowel sounds heard Central nervous system: Alert and oriented Extremities: No edema, no clubbing ,no cyanosis Skin: No rashes, no ulcers,no icterus     Data Reviewed: I have personally reviewed following labs and imaging studies  CBC: Recent Labs  Lab  08/26/22 0319 08/27/22 0828 08/28/22 0316 08/30/22 1051 08/31/22 0448  WBC 11.4* 13.3* 13.7* 12.5* 12.1*  HGB 11.7* 10.8* 11.3* 10.4* 10.5*  HCT 36.9 34.0* 36.0 33.8* 34.3*  MCV 100.0 101.2* 100.6* 101.2* 102.7*  PLT 330 254 255 274 [id number]   Basic Metabolic Panel: Recent Labs  Lab 08/28/22 1614 08/29/22 0331 08/30/22 0141 08/31/22 0448 09/01/22 0608  NA 145 142 141 146* 147*  K 4.2 3.8 3.7 3.8 4.2  CL 105 107 108 106 108  CO2 '28 26 28 31 28  '$ GLUCOSE 98 109* 140* 139* 133*  BUN '16 19 19 17 11  '$ CREATININE 0.97 0.94 0.82 0.95 0.85  CALCIUM 8.5* 8.4* 8.3* 8.9 9.0  MG  --  2.2 2.2 2.2 1.8  PHOS  --  2.9 2.6 2.8 2.3*     Recent Results (from the past 240 hour(s))  Aerobic/Anaerobic Culture w Gram Stain (surgical/deep wound)     Status: None   Collection Time: 08/22/22 11:24 AM   Specimen: PATH Cytology Peritoneal fluid  Result Value Ref Range Status   Specimen Description   Final    PERITONEAL Performed at [facility], [place] [address] [place], [place] [address]    Special Requests   Final    NONE Performed at [facility], [place]  [address], [place], [place] [address]    Gram Stain   Final    WBC PRESENT,BOTH PMN AND MONONUCLEAR NO ORGANISMS SEEN CYTOSPIN SMEAR    Culture   Final    No growth aerobically or anaerobically. Performed at [facility], [place] [address] [place], [place] [address]    Report Status 08/27/2022 FINAL  Final     Radiology Studies: [place] EKG SITE RITE  Result Date: 09/01/2022 If [facility] image not attached, placement could not be confirmed due to current cardiac rhythm.  DG CHEST PORT 1 VIEW  Result Date: 08/30/2022 CLINICAL DATA:  72 year old female with history of cough. EXAM: PORTABLE CHEST - 1 VIEW COMPARISON:  09/04/2010 FINDINGS: Apical projection. The mediastinal contours are within normal limits. No cardiomegaly. Right upper extremity PICC place with catheter tip terminating near the  cavoatrial junction. Gastric decompression tube in place, terminating off the inferior aspect this image. Low lung volumes. Mild prominence of pulmonary vasculature without evidence of overt pulmonary edema. No focal consolidation, pleural effusion, pneumothorax. No acute osseous abnormality. IMPRESSION: Low lung volumes with mild prominence of pulmonary vasculature, no evidence of overt pulmonary edema. Electronically Signed   By: Ruthann Cancer M.D.   On: 08/30/2022 13:31    Scheduled Meds:  Benzocaine   Mouth/Throat Once   bisacodyl  10 mg Rectal Daily   Chlorhexidine Gluconate Cloth  6 each Topical Daily   insulin aspart  0-15 Units Subcutaneous Q4H   lip balm   Topical BID   metoCLOPramide (REGLAN) injection  10 mg Intravenous Q6H   metoprolol tartrate  5 mg Intravenous Q6H   pantoprazole (PROTONIX) IV  40 mg Intravenous Q12H   simethicone  80 mg Oral QID   Continuous Infusions:  sodium chloride Stopped (08/25/22 0520)   sodium chloride Stopped (09/01/22 0905)   dextrose 55 mL/hr at 09/01/22 0823   ondansetron (ZOFRAN) IV     potassium PHOSPHATE IVPB (in mmol)     TPN ADULT (ION)       LOS: 12 days   Shelly Coss, MD [facility] P2/23/2024, 10:48 AM

## 2022-09-01 NOTE — Progress Notes (Signed)
Pharmacy Antibiotic Note  [name] is a 72 y.o. 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 On day 13 of admission, patient developed SOB and CTA was performed. Image quality was poor, but study suggested possible PE vs PNA (HAP). Pharmacy has been consulted for vancomycin and cefepime dosing.  Plan: Vancomycin 1g + 1g IV now, then 1250 mg IV q24 hr (est AUC 486, Cmin 11.4 based on SCr 0.85; Vd 0.5) Measure vancomycin AUC at steady state as indicated SCr q48 while on vanc Repeating MRSA PCR as patient now on D13 of admission Cefepime 2g IV q8 hr; following peripherally hereafter   Height: '5\' 5"'$  (165.1 cm) Weight: 105.1 kg (231 lb 11.3 oz) IBW/kg (Calculated) : 57  Temp (24hrs), Avg:98.7 F (37.1 C), Min:98.3 F (36.8 C), Max:99.1 F (37.3 C)  Recent Labs  Lab 08/26/22 0319 08/27/22 0828 08/28/22 0316 08/28/22 1614 08/29/22 0331 08/30/22 0141 08/30/22 1051 08/31/22 0448 09/01/22 0608  WBC 11.4* 13.3* 13.7*  --   --   --  12.5* 12.1*  --   CREATININE 0.99 0.98  --  0.97 0.94 0.82  --  0.95 0.85    Estimated Creatinine Clearance: 72 mL/min (by C-G formula based on SCr of 0.85 mg/dL).    Allergies  Allergen Reactions   Nsaids Other (See Comments)    Gastric pneumatosis    Antimicrobials this admission: 2/23 vancomycin >>  2/23 cefepime >>   Dose adjustments this admission: N/a  Microbiology results: 2/13 peritoneal Cx: NGF 2/11 BCx: NGF 2/11 MRSA PCR: negative  Thank you for allowing pharmacy to be a part of this patient's care.  Celestina Gironda A 09/01/2022 6:13 PM

## 2022-09-01 NOTE — Progress Notes (Signed)
Pt has pulled out PICC line. Unit RN notified to make lab aware so they can draw morning labs

## 2022-09-01 NOTE — Progress Notes (Signed)
Initial Nutrition Assessment  DOCUMENTATION CODES:   Obesity unspecified  INTERVENTION:  - TPN to restart tonight at goal of 37m/hr which provides: 94g protein, 264g dextrose, 514 [place] kcals (29% of total kcals) = 1786 kcals/day   - TPN management per pharmacy.   - Monitor magnesium, potassium, and phosphorus, MD to replete as needed.  - Monitor weight trends.   NUTRITION DIAGNOSIS:   Inadequate oral intake related to inability to eat as evidenced by NPO status. *ongoing  GOAL:   Patient will meet greater than or equal to 90% of their needs *met with TPN  MONITOR:   Weight trends, Diet advancement  REASON FOR ASSESSMENT:   Malnutrition Screening Tool    ASSESSMENT:   72year old with past medical history significant for class II obesity, DM2, anxiety, depression, panic attack, history of CVA 2013, HTN, cholelithiasis, IBS, history of seizure who presented complaining of worsening abdominal pain, associated with diarrhea, decreased oral intake, vomiting.  2/11 Admit 2/15 Clear Liquids 2/17 NPO  2/20 TPN started at 264mhr, NG placed 2/23 early AM patient pulled out PICC, PICC replaced  Patient pulled out PICC early this AM, PICC now replaced.  Reached goal TPN last night prior to PICC removal.   Patient reports she is tired of the NG in her nose. Is hopeful she will take a turn for the better soon.   Plan to restart TPN tonight at goal. Patient receiving D10 at 5523mr until TPN started.  Per surgery note today, patient with gastroparesis, suspected malignancy.     Medications reviewed and include: Dulcolax, Insulin, Reglan, Protonix, D10 '@55mL'$ /hr (ending at 1759, once TPN starts)  Labs reviewed:  Na 147 Phosphorus 2.3 HA1C 6.1 Blood Glucose 118-151 x24 hours Triglycerides 105   Diet Order:   Diet Order             Diet NPO time specified Except for: Sips with Meds, Ice Chips  Diet effective now                   EDUCATION NEEDS:  Education  needs have been addressed  Skin:  Skin Assessment: Reviewed RN Assessment  Last BM:  2/11  Height:  Ht Readings from Last 1 Encounters:  08/20/22 '5\' 5"'$  (1.651 m)   Weight:  Wt Readings from Last 1 Encounters:  09/01/22 105.1 kg   Ideal Body Weight:  56.82 kg  BMI:  Body mass index is 38.56 kg/m.  Estimated Nutritional Needs:  Kcal:  1800-2000 kcals Protein:  80-100 grams Fluid:  >/= 1.7L    AspSamson Frederic, LDN For contact information, refer to [facility]

## 2022-09-01 NOTE — Progress Notes (Signed)
Peripherally Inserted Central Catheter Placement  The IV Nurse has discussed with the patient and/or persons authorized to consent for the patient, the purpose of this procedure and the potential benefits and risks involved with this procedure.  The benefits include less needle sticks, lab draws from the catheter, and the patient may be discharged home with the catheter. Risks include, but not limited to, infection, bleeding, blood clot (thrombus formation), and puncture of an artery; nerve damage and irregular heartbeat and possibility to perform a PICC exchange if needed/ordered by physician.  Alternatives to this procedure were also discussed.  Bard Power PICC patient education guide, fact sheet on infection prevention and patient information card has been provided to patient /or left at bedside.    PICC Placement Documentation  PICC Double Lumen [id number] Left Basilic 44 cm 0 cm (Active)  Indication for Insertion or Continuance of Line Administration of hyperosmolar/irritating solutions (i.e. TPN, Vancomycin, etc.) 09/01/22 1052  Exposed Catheter (cm) 0 cm 09/01/22 1052  Site Assessment Clean, Dry, Intact 09/01/22 1052  Lumen #1 Status Flushed;Saline locked;Blood return noted 09/01/22 1052  Lumen #2 Status Flushed;Saline locked;Blood return noted 09/01/22 1052  Dressing Type Transparent;Securing device 09/01/22 1052  Dressing Status Antimicrobial disc in place 09/01/22 1052  Safety Lock Not Applicable Q000111Q [id number]  Line Care Connections checked and tightened 09/01/22 1052  Line Adjustment (NICU/IV Team Only) No 09/01/22 1052  Dressing Intervention New dressing 09/01/22 1052  Dressing Change Due 09/08/22 09/01/22 [place] 09/01/2022, 10:53 AM

## 2022-09-01 NOTE — Progress Notes (Addendum)
Discussed with radiology about the finding of left sided PE,pleural effusion and pneumonia. Starting heparin drip,broad spectrum  abx,US thoracentesis. Also checking d dimer and left upper extremity venous doppler.

## 2022-09-01 NOTE — Progress Notes (Signed)
Pt pulled out PICC line, removed dressing and put a pressure dressing in place, no active bleeding at this time from IV site. Notified A. Zebedee Iba, NP and IV team. Will have IV team insert a peripheral IV for now. Pt resting comfortably in bed at this time.

## 2022-09-01 NOTE — Progress Notes (Addendum)
Subjective/Chief Complaint: Pulled picc out overnight.  Just isn't feeling well this morning.  Still with some pain in her abdomen.    Objective: Vital signs in last 24 hours: Temp:  [98.5 F (36.9 C)-99.6 F (37.6 C)] 98.5 F (36.9 C) (02/23 0546) Pulse Rate:  [107-109] 108 (02/23 0546) Resp:  [17-18] 18 (02/23 0546) BP: (157-169)/(74-99) 160/99 (02/23 0546) SpO2:  [97 %] 97 % (02/23 0546) Weight:  [105.1 kg] 105.1 kg (02/23 0500) Last BM Date : 08/20/22  Intake/Output from previous day: 02/22 0701 - 02/23 0700 In: 1482.7 [P.O.:270; I.V.:1112.7; IV Piggyback:100] Out: 2200 [Urine:900; Emesis/NG output:1300] Intake/Output this shift: Total I/O In: 90 [P.O.:90] Out: -   Ab distended, tight, NGT with 1300cc of bilious output yesterday, mild diffuse tenderness, no peritonitis or guarding.    Lab Results:  Recent Labs    08/30/22 1051 08/31/22 0448  WBC 12.5* 12.1*  HGB 10.4* 10.5*  HCT 33.8* 34.3*  PLT 274 210   BMET Recent Labs    08/31/22 0448 09/01/22 0608  NA 146* 147*  K 3.8 4.2  CL 106 108  CO2 31 28  GLUCOSE 139* 133*  BUN 17 11  CREATININE 0.95 0.85  CALCIUM 8.9 9.0   PT/INR No results for input(s): "LABPROT", "INR" in the last 72 hours. ABG Recent Labs    08/29/22 0950  PHART 7.46*  HCO3 28.5*    Studies/Results: [place] EKG SITE RITE  Result Date: 09/01/2022 If [facility] image not attached, placement could not be confirmed due to current cardiac rhythm.  DG CHEST PORT 1 VIEW  Result Date: 08/30/2022 CLINICAL DATA:  72 year old female with history of cough. EXAM: PORTABLE CHEST - 1 VIEW COMPARISON:  09/04/2010 FINDINGS: Apical projection. The mediastinal contours are within normal limits. No cardiomegaly. Right upper extremity PICC place with catheter tip terminating near the cavoatrial junction. Gastric decompression tube in place, terminating off the inferior aspect this image. Low lung volumes. Mild prominence of pulmonary vasculature  without evidence of overt pulmonary edema. No focal consolidation, pleural effusion, pneumothorax. No acute osseous abnormality. IMPRESSION: Low lung volumes with mild prominence of pulmonary vasculature, no evidence of overt pulmonary edema. Electronically Signed   By: Ruthann Cancer M.D.   On: 08/30/2022 13:31   CT ABDOMINAL MASS BIOPSY  Result Date: 08/30/2022 CLINICAL DATA:  Ascites and omental stranding. Atypical cells on paracentesis cytology. EXAM: CT GUIDED CORE BIOPSY OF OMENTUM ANESTHESIA/SEDATION: Intravenous Fentanyl 70mg and Versed 0.'5mg'$  were administered as conscious sedation during continuous monitoring of the patient's level of consciousness and physiological / cardiorespiratory status by the radiology RN, with a total moderate sedation time of 10 minutes. PROCEDURE: The procedure risks, benefits, and alternatives were explained to the patient. Questions regarding the procedure were encouraged and answered. The patient understands and consents to the procedure. select axial scans through the abdomen were obtained. Omentum localized, appropriate skin entry site was determined and marked. The operative field was prepped with chlorhexidinein a sterile fashion, and a sterile drape was applied covering the operative field. A sterile gown and sterile gloves were used for the procedure. Local anesthesia was provided with 1% Lidocaine. Under CT fluoroscopic guidance, a 17 gauge trocar needle was advanced to the margin of the lesion. Once needle tip position was confirmed, coaxial 18-gauge core biopsy samples were obtained, submitted in saline to surgical pathology. The guide needle was removed. Postprocedure scans show no hemorrhage or other apparent complication. The patient tolerated the procedure well. COMPLICATIONS: None immediate FINDINGS: Omental  localized. Representative core biopsy samples obtained as above. IMPRESSION: Technically successful CT-guided core biopsy, omentum. RADIATION DOSE  REDUCTION: This exam was performed according to the departmental dose-optimization program which includes automated exposure control, adjustment of the mA and/or kV according to patient size and/or use of iterative reconstruction technique. Electronically Signed   By: Lucrezia Europe M.D.   On: 08/30/2022 10:49    Anti-infectives: Anti-infectives (From admission, onward)    Start     Dose/Rate Route Frequency Ordered Stop   08/20/22 2000  piperacillin-tazobactam (ZOSYN) IVPB 3.375 g        3.375 g 12.5 mL/hr over 240 Minutes Intravenous Every 8 hours 08/20/22 1325 08/25/22 1918   08/20/22 1130  piperacillin-tazobactam (ZOSYN) IVPB 3.375 g        3.375 g 100 mL/hr over 30 Minutes Intravenous  Once 08/20/22 1126 08/20/22 1223       Assessment/Plan: Abdominal pain, gastroparesis, suspected malignancy - discussed case with pathology this morning and some stains are still pending; however, her omentum is highly suspicious for malignancy, but type is still pending. -CA19-9, CEA, normal but CA-125 is quite elevated over 600. -I had a long discussion with the patient this morning discussing these findings.  I have also called her daughter, [name], to inform her of this information. -we discussed that malignancy is suspected and type is pending.  We discussed that she will need oncologic evaluation once type confirmed.  We discussed that there is nothing surgical going on in her abdomen at this time.  She unfortunately has some gastroparesis going on with her stomach, likely reactive to everything in her abdomen.  She has no evidence of obstruction on her CT scan.  If we were concerned about a proximal etiology of obstruction as opposed to gastroparesis, GI suggested UGI over EGD due to recent gastric pneumatosis, although this has resolved and she would likely be safe for EGD.   -cont NPO/NGT for now.   -cont TNA -discussed case, recommendations, etc with primary service   FEN -NPO, NGT/TPN VTE - SCDs,  ok for chemical prophylaxis from our standpoint ID - zosyn 2/11 >> 2/20 Foley - per primary    DM Hx of seizures HTN IBS Morbid obesity GAD AKI - resolved   I reviewed hospitalist notes, last 24 h vitals and pain scores, last 48 h intake and output, last 24 h labs and trends, and last 24 h imaging results.  Discussed with primary team.   Henreitta Cea, PA-C 09/01/2022

## 2022-09-01 NOTE — Procedures (Signed)
Ultrasound-guided therapeutic paracentesis performed yielding 2.3 liters of amber fluid. No immediate complications. EBL none.

## 2022-09-01 NOTE — Consult Note (Signed)
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 CLINICAL DATA:  Right lower quadrant pain.  Sepsis. EXAM: CT ABDOMEN AND PELVIS WITHOUT CONTRAST TECHNIQUE: Multidetector CT imaging of the abdomen and pelvis was performed following the standard protocol without IV contrast. RADIATION DOSE REDUCTION: This exam was performed according to the departmental dose-optimization program which includes automated exposure control, adjustment of the mA and/or kV according to patient size and/or use of iterative reconstruction technique. COMPARISON:  08/15/2010 FINDINGS: Lower chest: No acute findings. Hepatobiliary: No mass visualized on this unenhanced exam. Intrahepatic portal venous gas is seen. Gallstones are seen, however there is no evidence of cholecystitis or biliary dilatation. Pancreas: No mass or inflammatory process visualized on this unenhanced exam. Spleen:  Within normal limits in size. Adrenals/Urinary tract: No evidence of urolithiasis or hydronephrosis.  Unremarkable unopacified urinary bladder. Stomach/Bowel: Small hiatal hernia is seen. No evidence of bowel obstruction. Mild pneumatosis is seen in the gastric wall, however there is no evidence of gastric wall thickening. Small amount of venous gas is seen within the mesentery. No evidence of free intraperitoneal air or abscess. Moderate wall thickening is seen involving the transverse colon, with soft tissue stranding seen throughout the omental fat. Moderate ascites is noted. Vascular/Lymphatic: No pathologically enlarged lymph nodes identified. No evidence of abdominal aortic aneurysm. Aortic atherosclerotic calcification incidentally noted. Reproductive: Mildly enlarged uterus is stable with small fibroids better visualized on previous contrast enhanced exam. Other:  None. Musculoskeletal:  No suspicious bone lesions identified. IMPRESSION: Portal venous and mesenteric venous gas. Mild pneumatosis in the gastric wall, without evidence of gastric wall thickening. Emphysematous gastritis cannot be excluded. Moderate wall thickening involving the transverse colon. Differential diagnosis includes infectious or ischemic colitis, or less likely neoplasm. Moderate ascites and diffuse omental soft tissue stranding. Differential diagnosis includes infectious etiologies and peritoneal carcinomatosis. Cholelithiasis. No radiographic evidence of cholecystitis. Electronically Signed   By: Marlaine Hind M.D.   On: 08/20/2022 12:38    CT Angio Chest Pulmonary Embolism (PE) W or WO Contrast  Addendum Date: 09/01/2022   ADDENDUM REPORT: 09/01/2022 15:04 ADDENDUM: These results were called by telephone at the time of interpretation on 09/01/2022 at 3:04 pm to provider [facility] , who verbally acknowledged these results. Electronically Signed   By: Zetta Bills M.D.   On: 09/01/2022 15:04   Result Date: 09/01/2022 CLINICAL DATA:  Pulmonary embolism, shortness of breath. EXAM: CT ANGIOGRAPHY CHEST WITH CONTRAST  TECHNIQUE: Multidetector CT imaging of the chest was performed using the standard protocol during bolus administration of intravenous contrast. Multiplanar CT image reconstructions and MIPs were obtained to evaluate the vascular anatomy. RADIATION DOSE REDUCTION: This exam was performed according to the departmental dose-optimization program which includes automated exposure control, adjustment of the mA and/or kV according to patient size and/or use of iterative reconstruction technique. CONTRAST:  71m OMNIPAQUE IOHEXOL 350 MG/ML SOLN COMPARISON:  Chest radiograph of August 30, 2022 and CT of the abdomen and pelvis which was performed on August 27, 2022. FINDINGS: Cardiovascular: The aorta displays a normal three-vessel branching pattern. Aortic atherosclerosis both calcified and noncalcified without dilation. Normal heart size without pericardial effusion or nodularity. Central pulmonary vasculature is opacified to 174 Hounsfield units, study is limited by bolus timing. No central pulmonary embolism. w eccentric filling defect in LEFT lower lobe pulmonary artery on image 46/4. Potential eccentric thrombi in RIGHT lower lobe segmental branches on image 55/4 Non opacification of peripheral LEFT lower lobe pulmonary artery leading into area dense consolidative change. In general peripheral branches of  the pulmonary arterial bed are not well opacified elsewhere in the chest likely due to bolus timing factors but LEFT lower lobe is certainly more pronounced than other areas. LEFT-sided PICC line terminates at the midportion of the superior vena cava. Mediastinum/Nodes: No adenopathy in the chest. Lungs/Pleura: Dense LEFT lower lobe consolidative change with large LEFT-sided pleural effusion. No pleural effusion in the RIGHT chest. Mild interstitial thickening and ground-glass in the upper lobes. Upper Abdomen: Loculated fluid in the gastrohepatic ligament. Signs of gastric edema. Perihepatic ascites and partially  visualized stranding or nodularity in the omentum. No acute upper abdominal process otherwise to the extent evaluated. Musculoskeletal: No chest wall mass. No acute bone finding or destructive bone process. Review of the MIP images confirms the above findings. IMPRESSION: 1. Study limited by bolus timing factors. No central pulmonary embolism. 2. Potential eccentric nonocclusive thrombus in the LEFT lower lobe pulmonary artery. Peripheral LEFT lower lobe pulmonary artery leading into dense consolidative change does not enhance to the same degree as other vessels in the chest. Significance uncertain. Relative hypoperfusion or larger nonvisualized embolism to this area is considered. Consolidative changes are more suggestive of collapse due to large pleural effusion potentially associated with underlying pneumonia rather than pulmonary infarct based on appearance though findings remain nonspecific. 3. Motion limited assessment in limited evaluation due to bolus timing with eccentric filling of RIGHT lower lobe segmental branches suggested, difficult to confirm. 4. Appearance of this area and the LEFT lung base raises the question of recent but not acute pulmonary emboli. Further assessment with venous Doppler evaluation including LEFT upper extremity (due to PICC line) may be helpful to exclude a source which might change patient management. 5. Ascites in the upper abdomen with loculated fluid in the gastrohepatic ligament and omental stranding. Constellation of findings and appearance in addition of malignancy could also be seen with peritonitis. The liver shows lobular contours. Correlate with any laboratory evidence of peritonitis either primary or related to spontaneous bacterial peritonitis. 6. Aortic atherosclerosis. 7. LEFT-sided PICC line terminates at the midportion of the superior vena cava. Aortic Atherosclerosis (ICD10-I70.0). A call is out to the referring provider to further discuss findings in the above  case. Electronically Signed: By: Zetta Bills M.D. On: 09/01/2022 14:57   DG Abd 2 Views  Result Date: 09/01/2022 CLINICAL DATA:  Ascites EXAM: ABDOMEN - 2 VIEW COMPARISON:  06/29/2023 FINDINGS: Nasogastric tube tip projects over mid stomach. Air-filled nondistended ascending through transverse colon. No bowel dilatation or bowel wall thickening. Osseous structures unremarkable. No pathologic calcifications. IMPRESSION: No acute abnormalities. Electronically Signed   By: Lavonia Dana M.D.   On: 09/01/2022 13:36   [place] EKG SITE RITE  Result Date: 09/01/2022 If [facility] image not attached, placement could not be confirmed due to current cardiac rhythm.  DG CHEST PORT 1 VIEW  Result Date: 08/30/2022 CLINICAL DATA:  72 year old female with history of cough. EXAM: PORTABLE CHEST - 1 VIEW COMPARISON:  09/04/2010 FINDINGS: Apical projection. The mediastinal contours are within normal limits. No cardiomegaly. Right upper extremity PICC place with catheter tip terminating near the cavoatrial junction. Gastric decompression tube in place, terminating off the inferior aspect this image. Low lung volumes. Mild prominence of pulmonary vasculature without evidence of overt pulmonary edema. No focal consolidation, pleural effusion, pneumothorax. No acute osseous abnormality. IMPRESSION: Low lung volumes with mild prominence of pulmonary vasculature, no evidence of overt pulmonary edema. Electronically Signed   By: Ruthann Cancer M.D.   On: 08/30/2022 13:31  CT ABDOMINAL MASS BIOPSY  Result Date: 08/30/2022 CLINICAL DATA:  Ascites and omental stranding. Atypical cells on paracentesis cytology. EXAM: CT GUIDED CORE BIOPSY OF OMENTUM ANESTHESIA/SEDATION: Intravenous Fentanyl 49mg and Versed 0.'5mg'$  were administered as conscious sedation during continuous monitoring of the patient's level of consciousness and physiological / cardiorespiratory status by the radiology RN, with a total moderate sedation time of 10  minutes. PROCEDURE: The procedure risks, benefits, and alternatives were explained to the patient. Questions regarding the procedure were encouraged and answered. The patient understands and consents to the procedure. select axial scans through the abdomen were obtained. Omentum localized, appropriate skin entry site was determined and marked. The operative field was prepped with chlorhexidinein a sterile fashion, and a sterile drape was applied covering the operative field. A sterile gown and sterile gloves were used for the procedure. Local anesthesia was provided with 1% Lidocaine. Under CT fluoroscopic guidance, a 17 gauge trocar needle was advanced to the margin of the lesion. Once needle tip position was confirmed, coaxial 18-gauge core biopsy samples were obtained, submitted in saline to surgical pathology. The guide needle was removed. Postprocedure scans show no hemorrhage or other apparent complication. The patient tolerated the procedure well. COMPLICATIONS: None immediate FINDINGS: Omental localized. Representative core biopsy samples obtained as above. IMPRESSION: Technically successful CT-guided core biopsy, omentum. RADIATION DOSE REDUCTION: This exam was performed according to the departmental dose-optimization program which includes automated exposure control, adjustment of the mA and/or kV according to patient size and/or use of iterative reconstruction technique. Electronically Signed   By: DLucrezia EuropeM.D.   On: 08/30/2022 10:49   DG Abd Portable 1V  Result Date: 08/29/2022 CLINICAL DATA:  NGT placement EXAM: PORTABLE ABDOMEN - 1 VIEW COMPARISON:  08/26/2022 FINDINGS: Limited image of the upper abdomen demonstrates NG tube superimposed with stomach, below the diaphragm. IMPRESSION: NGT in place. Electronically Signed   By: JSammie BenchM.D.   On: 08/29/2022 12:34   [place] SITE R[place]  Result Date: 08/28/2022 If Site Rite image not attached, placement could not be confirmed due to current  cardiac rhythm.  CT ABDOMEN PELVIS W CONTRAST  Result Date: 08/27/2022 CLINICAL DATA:  Abdominal pain, history of recent paracentesis EXAM: CT ABDOMEN AND PELVIS WITH CONTRAST TECHNIQUE: Multidetector CT imaging of the abdomen and pelvis was performed using the standard protocol following bolus administration of intravenous contrast. RADIATION DOSE REDUCTION: This exam was performed according to the departmental dose-optimization program which includes automated exposure control, adjustment of the mA and/or kV according to patient size and/or use of iterative reconstruction technique. CONTRAST:  102mOMNIPAQUE IOHEXOL 300 MG/ML  SOLN COMPARISON:  08/20/2022 FINDINGS: Lower chest: New small left pleural effusion is noted with left lower lobe consolidation. No parenchymal nodules are seen. Hepatobiliary: Previously seen portal venous air within the liver has resolved in the interval no focal mass lesion is seen. The gallbladder is well distended with multiple gallstones stable from the prior exam. No biliary ductal dilatation is seen. Pancreas: Unremarkable. No pancreatic ductal dilatation or surrounding inflammatory changes. Spleen: Normal in size without focal abnormality. Adrenals/Urinary Tract: Adrenal glands are within normal limits. Kidneys demonstrate a normal enhancement pattern bilaterally. No renal calculi or obstructive changes are seen. Normal excretion is seen bilaterally. The bladder is decompressed by Foley catheter. Stomach/Bowel: Colon is predominately decompressed. The transverse colon demonstrates some air within in the previously seen diffuse wall thickening and pericolonic inflammatory change is reduced in the interval from the prior exam. The appendix is not  well visualized. Few mildly prominent loops of small bowel are noted without definitive obstructive change. Edema within the mesentery is noted in these changes are likely reactive to the ascites identified. The stomach is well distended  with fluid and contrast material. No extravasation is identified. The previously seen pneumatosis in the gastric wall has resolved as has the degree of portal and mesenteric venous gas. Small hiatal hernia is again noted. Vascular/Lymphatic: Atherosclerotic calcifications of the abdominal aorta are noted. No sizable adenopathy is seen. Reproductive: Uterus and bilateral adnexa are unremarkable. Other: Mild to moderate ascites remains in the abdomen despite interval paracentesis yesterday. Some soft tissue changes are again noted in the omental fat anteriorly. This is similar to that seen on prior exam. Musculoskeletal: Degenerative changes of lumbar spine are noted. No acute bony abnormality is seen. Mild changes of anasarca are noted in the lateral abdominal walls. IMPRESSION: Persistent mild ascites with changes in the omental fat anteriorly again both infectious and neoplastic etiologies deserve consideration. Improved appearance of the transverse colon when compared with the prior exam. No evidence of perforation is noted. Resolution of previously seen gastric pneumatosis as well as portal and mesenteric venous gas. Cholelithiasis without complicating factors. New left pleural effusion and left lower lobe consolidation. Electronically Signed   By: Inez Catalina M.D.   On: 08/27/2022 17:14   US Paracentesis  Result Date: 08/27/2022 INDICATION: Recurrent ascites of unknown etiology. Request for therapeutic paracentesis. EXAM: ULTRASOUND GUIDED PARACENTESIS MEDICATIONS: 1% lidocaine 20 mL COMPLICATIONS: None immediate. PROCEDURE: Informed written consent was obtained from the patient after a discussion of the risks, benefits and alternatives to treatment. A timeout was performed prior to the initiation of the procedure. Initial ultrasound scanning demonstrates a large amount of ascites within the left lateral abdomen. The left lateral abdomen was prepped and draped in the usual sterile fashion. 1% lidocaine was  used for local anesthesia. Following this, a 19 gauge, 10-cm, Yueh catheter was introduced. An ultrasound image was saved for documentation purposes. The paracentesis was performed. The catheter was removed and a dressing was applied. The patient tolerated the procedure well without immediate post procedural complication. FINDINGS: A total of approximately 2.2 L of clear yellow fluid was removed. IMPRESSION: Successful ultrasound-guided paracentesis yielding 2.2 liters of peritoneal fluid. Procedure performed by: Gareth Eagle, PA-C Electronically Signed   By: Albin Felling M.D.   On: 08/27/2022 11:34   DG Abd Portable 1V  Result Date: 08/26/2022 CLINICAL DATA:  Nausea vomiting EXAM: PORTABLE ABDOMEN - 1 VIEW COMPARISON:  08/23/2022 FINDINGS: The bowel gas pattern is normal. No radio-opaque calculi or other significant radiographic abnormality are seen. IMPRESSION: Nonspecific bowel gas pattern. Electronically Signed   By: Sammie Bench M.D.   On: 08/26/2022 10:10   DG Abd Portable 1V  Result Date: 08/23/2022 CLINICAL DATA:  Abdominal pain. EXAM: PORTABLE ABDOMEN - 1 VIEW COMPARISON:  09/04/2010. FINDINGS: Gas-filled bowel loops without dilation. No free air. The right abdominal wall is excluded from the field of view. No radiopaque calculi. Degenerative changes of the spine. IMPRESSION: Gas-filled bowel loops without dilation. Electronically Signed   By: Emmit Alexanders M.D.   On: 08/23/2022 11:01   US Paracentesis  Result Date: 08/22/2022 INDICATION: Abdominal distention. Ascites. Request for diagnostic and therapeutic paracentesis. EXAM: ULTRASOUND GUIDED LEFT LOWER QUADRANT PARACENTESIS MEDICATIONS: 1% plain lidocaine, 10 mL COMPLICATIONS: None immediate. PROCEDURE: Informed written consent was obtained from the patient after a discussion of the risks, benefits and alternatives to treatment. A timeout was  performed prior to the initiation of the procedure. Initial ultrasound scanning demonstrates a  large amount of ascites within the left lower abdominal quadrant. The left lower abdomen was prepped and draped in the usual sterile fashion. 1% lidocaine was used for local anesthesia. Following this, a 19 gauge, 10-cm, Yueh catheter was introduced. An ultrasound image was saved for documentation purposes. The paracentesis was performed. The catheter was removed and a dressing was applied. The patient tolerated the procedure well without immediate post procedural complication. FINDINGS: A total of approximately 5.5 L of clear yellow fluid was removed. Samples were sent to the laboratory as requested by the clinical team. IMPRESSION: Successful ultrasound-guided paracentesis yielding 5.5 liters of peritoneal fluid. Read by: Ascencion Dike PA-C Electronically Signed   By: Corrie Mckusick D.O.   On: 08/22/2022 12:27   CT ABDOMEN PELVIS WO CONTRAST  Result Date: 08/20/2022 CLINICAL DATA:  Right lower quadrant pain.  Sepsis. EXAM: CT ABDOMEN AND PELVIS WITHOUT CONTRAST TECHNIQUE: Multidetector CT imaging of the abdomen and pelvis was performed following the standard protocol without IV contrast. RADIATION DOSE REDUCTION: This exam was performed according to the departmental dose-optimization program which includes automated exposure control, adjustment of the mA and/or kV according to patient size and/or use of iterative reconstruction technique. COMPARISON:  08/15/2010 FINDINGS: Lower chest: No acute findings. Hepatobiliary: No mass visualized on this unenhanced exam. Intrahepatic portal venous gas is seen. Gallstones are seen, however there is no evidence of cholecystitis or biliary dilatation. Pancreas: No mass or inflammatory process visualized on this unenhanced exam. Spleen:  Within normal limits in size. Adrenals/Urinary tract: No evidence of urolithiasis or hydronephrosis. Unremarkable unopacified urinary bladder. Stomach/Bowel: Small hiatal hernia is seen. No evidence of bowel obstruction. Mild pneumatosis is  seen in the gastric wall, however there is no evidence of gastric wall thickening. Small amount of venous gas is seen within the mesentery. No evidence of free intraperitoneal air or abscess. Moderate wall thickening is seen involving the transverse colon, with soft tissue stranding seen throughout the omental fat. Moderate ascites is noted. Vascular/Lymphatic: No pathologically enlarged lymph nodes identified. No evidence of abdominal aortic aneurysm. Aortic atherosclerotic calcification incidentally noted. Reproductive: Mildly enlarged uterus is stable with small fibroids better visualized on previous contrast enhanced exam. Other:  None. Musculoskeletal:  No suspicious bone lesions identified. IMPRESSION: Portal venous and mesenteric venous gas. Mild pneumatosis in the gastric wall, without evidence of gastric wall thickening. Emphysematous gastritis cannot be excluded. Moderate wall thickening involving the transverse colon. Differential diagnosis includes infectious or ischemic colitis, or less likely neoplasm. Moderate ascites and diffuse omental soft tissue stranding. Differential diagnosis includes infectious etiologies and peritoneal carcinomatosis. Cholelithiasis. No radiographic evidence of cholecystitis. Electronically Signed   By: Marlaine Hind M.D.   On: 08/20/2022 12:38    Assessment and Plan:   This is a pleasant 72 yr old with Type II DM, HTN, severe obesity who was admitted with abdominal pain, nausea, weight loss. She was found to have ascites, pneumobilia, and suggestion of peritoneal carcinomatosis. 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 Unfortunately on CT imaging, we can't really see a definite abnormality in the uterus or ovaries. Discussed with Dr Jacalyn Lefevre from IR We will recommend transvaginal US to determine ovarian or endometrial primary. We discussed that this is concerning for metastatic gyn primary  cancer, however some findings are uncertain, hopefully [place] will help answer. We discussed about considering palliative chemotherapy, intent  of treatment is not curative. She is interested in evaluating it further. We discussed that she is not necessarily in the best shape to do aggressive chemo   We will arrange for outpatient follow up with Dr Alvy Bimler and we are happy to see her as needed.  The length of time of the face-to-face encounter was 55 minutes. More than 50% of time was spent counseling and coordination of care.  Thank you for this referral.

## 2022-09-01 NOTE — Progress Notes (Signed)
Pt arrived back from procedure; vitals stable

## 2022-09-01 NOTE — Progress Notes (Addendum)
ANTICOAGULATION CONSULT NOTE - Initial Consult  Pharmacy Consult for IV heparin Indication: pulmonary embolus  Allergies  Allergen Reactions   Nsaids Other (See Comments)    Gastric pneumatosis    Patient Measurements: Height: '5\' 5"'$  (165.1 cm) Weight: 105.1 kg (231 lb 11.3 oz) IBW/kg (Calculated) : 57 Heparin Dosing Weight: 82 kg  Vital Signs: Temp: 98.9 F (37.2 C) (02/23 1428) Temp Source: Oral (02/23 1428) BP: 143/73 (02/23 1428) Pulse Rate: 115 (02/23 1428)  Labs: Recent Labs    08/30/22 0141 08/30/22 1051 08/31/22 0448 09/01/22 0608  HGB  --  10.4* 10.5*  --   HCT  --  33.8* 34.3*  --   PLT  --  274 210  --   CREATININE 0.82  --  0.95 0.85    Estimated Creatinine Clearance: 72 mL/min (by C-G formula based on SCr of 0.85 mg/dL).   Medical History: Past Medical History:  Diagnosis Date   Diabetes mellitus type 2, noninsulin dependent ([place]) 08/20/2022   Generalized anxiety disorder 08/20/2022   History of panic attacks 08/20/2022   History of seizures 2013 08/20/2022   Hypertension    Hypertensive urgency in 2012 08/20/2022   Irritable bowel syndrome (IBS) 08/20/2022   Melanoma of back ([place]) 1997   Severe obesity (BMI 35.0-35.9 with comorbidity) ([place]) 08/20/2022    Medications:  Medications Prior to Admission  Medication Sig Dispense Refill Last Dose   acetaminophen (TYLENOL) 500 MG tablet Take 1,000 mg by mouth every 6 (six) hours as needed for mild pain.   unk   ALPRAZolam (XANAX) 0.25 MG tablet Take 0.25 mg by mouth 3 (three) times daily as needed. For anxiety    unk   citalopram (CELEXA) 20 MG tablet Take 20 mg by mouth daily.     08/19/2022   dextromethorphan (DELSYM) 30 MG/5ML liquid Take 60 mg by mouth at bedtime as needed for cough. For cough   unk   dicyclomine (BENTYL) 20 MG tablet Take 20 mg by mouth 2 (two) times daily as needed for spasms.   08/19/2022   famotidine (PEPCID AC MAXIMUM STRENGTH) 20 MG tablet Take 20 mg by mouth daily as needed  for heartburn or indigestion.   08/19/2022   hydrochlorothiazide (HYDRODIURIL) 12.5 MG tablet Take 12.5 mg by mouth daily.   08/19/2022   hydroxypropyl methylcellulose (ISOPTO TEARS) 2.5 % ophthalmic solution Place 1 drop into both eyes 3 (three) times daily as needed. For dry eyes    Past Week   loratadine (CLARITIN) 10 MG tablet Take 10 mg by mouth daily.     08/19/2022   losartan (COZAAR) 100 MG tablet Take 100 mg by mouth daily.   08/19/2022   metoprolol (LOPRESSOR) 50 MG tablet Take 50 mg by mouth 2 (two) times daily.     08/19/2022 at 1100   oxymetazoline (AFRIN) 0.05 % nasal spray Place 1 spray into both nostrils daily as needed for congestion.   Past Month   pravastatin (PRAVACHOL) 10 MG tablet Take 10 mg by mouth at bedtime.   Past Month   Scheduled:   Benzocaine   Mouth/Throat Once   bisacodyl  10 mg Rectal Daily   Chlorhexidine Gluconate Cloth  6 each Topical Daily   heparin  6,000 Units Intravenous Once   insulin aspart  0-15 Units Subcutaneous Q4H   lip balm   Topical BID   metoCLOPramide (REGLAN) injection  10 mg Intravenous Q6H   metoprolol tartrate  5 mg Intravenous Q6H   pantoprazole (PROTONIX)  IV  40 mg Intravenous Q12H   simethicone  80 mg Oral QID   sodium chloride flush  10-40 mL Intracatheter Q12H   PRN: sodium chloride, acetaminophen **OR** acetaminophen, alum & mag hydroxide-simeth, diazepam, hydrALAZINE, HYDROcodone-acetaminophen, HYDROmorphone (DILAUDID) injection, magic mouthwash, menthol-cetylpyridinium, ondansetron (ZOFRAN) IV **OR** ondansetron (ZOFRAN) IV, mouth rinse, phenol, prochlorperazine, simethicone, sodium chloride flush, sodium chloride flush  Assessment: 1 yoF with PMH DM2, CVA, HTN, epilepsy, admitted for abdominal pain. 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 On day 13 of admission, patient developed SOB and CTA was performed. Image quality was poor, but study  suggested possible PE vs PNA. D-dimer returned markedly elevated and heparin was started per Pharmacy dosing.  Baseline INR, aPTT: not done Prior anticoagulation: none; SCDs only this admission  Significant events:  Today, 09/01/2022: CBC: Hgb low but stable; Plt stable WNL AKI on admission resolved; SCr now back to baseline (<1) No bleeding or infusion issues per nursing  Goal of Therapy: Heparin level 0.3-0.7 units/ml Monitor platelets by anticoagulation protocol: Yes  Plan: Heparin 6000 units IV bolus x 1 Heparin 1500 units/hr IV infusion Check heparin level 8 hrs after start Daily CBC, daily heparin level once stable Monitor for signs of bleeding or thrombosis  Reuel Boom, PharmD, BCPS [phone] 09/01/2022, 5:27 PM

## 2022-09-01 NOTE — Progress Notes (Addendum)
PHARMACY - TOTAL PARENTERAL NUTRITION CONSULT NOTE   Indication:  Intolerance to enteral feeding  Patient Measurements: Height: '5\' 5"'$  (165.1 cm) Weight: 105.1 kg (231 lb 11.3 oz) IBW/kg (Calculated) : 57 TPN AdjBW (KG): 67.7 Body mass index is 38.56 kg/m.  Assessment:  Pt is a 89 yoF admitted on 2/11 with abdominal pain, N/V ongoing for several weeks. Imaging revealed gastric pneumatosis, penumobilia - no indication for surgery at this time. Pt has had minimal PO intake > 1 week. Pharmacy consulted to initiate TPN.  Glucose / Insulin: T2DM. CBG goal < 150 -CBG range: 118-151 (at goal). mSSI q4h - 11 units/24 hrs Electrolytes: Na (147) slightly elevated. Mg (1.8) on lower end of normal, Phos (2.3) slightly low.  -Ca (9) with normal albumin - however, expect Albumin values on 2/20 and 2/22 are falsely elevated 2/2 albumin IV q8h. Previous albumin low at 2.3 on 2/16. Given concern for malignancy, will err on the side of caution and use albumin value of 2.3 for calculated CorrCa of 10.4.  -All other lytes WNL Renal: SCr, BUN WNL Hepatic: T.bili slightly elevated. Alk Phos, LFTs WNL. TG, Alb WNL (note: was on albumin TID from 2/15 > 2/22) Intake / Output; MIVF: mIVF: NS @ KVO -UOP: 900 mL. Emesis/NG output: 1300 mL (pt agreed to placement on 2/20) GI Imaging: -2/11 CT Abd: Portal vein and mesenteric venous gas. Mild pneumatosis in the gastric wall without evidence of gastric wall thickening. Moderate wall thickening involving transverse colon (differential includes infectious or ischemic colitis or neoplasm). Moderate ascites, cholethiasis.  -2/18 CT Abd: Persistent mild ascites (infectious or neoplastic etiology?). Improved appearance of transverse colon, no evidence of perforation. Resolution of gastric pneumatosis as well as portal and mesenteric venous gas. GI Surgeries / Procedures:  -2/21: CT core omental biopsy  Central access: Double lumen PICC placed 2/20 TPN start date: 2/20    Nutritional Goals: Goal TPN rate is 55 mL/hr (provides 93 g of protein and 1790 kcals per day)  Discussed with MD on 2/20: Using Clinisol 15% to concentrate TPN to be able to meet nutritional needs with minimal volume.  Discussed again with MD on 2/21 - reports pt remains volume overloaded with ascites and crackles. Planning to continue with concentrated TPN and MD to let pharmacy know if plan changes.   RD Assessment: Estimated Needs Total Energy Estimated Needs: 1800-2000 kcals Total Protein Estimated Needs: 80-100 grams Total Fluid Estimated Needs: >/= 1.7L  Current Nutrition:  NPO except for sips with meds, ice chips TPN  Significant Events:  -2/23: Pt pulled PICC line early this morning ~0300. TPN order discontinued. MD placed order for new PICC.    Plan:  Now:  TPN discontinued - cannot be resumed. Infuse D10 @ 55 mL/hr until 1800 this evening when new bag of TPN will start Mg 2 g IV once K Phos 30 mmol IV once per MD  At 1800: Start new TPN at goal rate of 55 mL/hr at 1800 if central line obtained Electrolytes in TPN: Decrease Na Na 25 mEq/L, K 64mq/L, Ca 0 mEq/L, Mg 538m/L, and Phos 1561m/L.  Cl:Ac Max Cl (1:1.13) Add standard MVI and trace elements to TPN.  Thiamine in TPN x5 days (2/20 > 2/24) Continue SSI at moderate q4h and [facility]. Monitor TPN labs on Mon/Thurs. Recheck electrolytes with AM labs tomorrow and Sunday.   MarLenis NoonharmD 09/01/2022,7:37 AM

## 2022-09-01 NOTE — Progress Notes (Signed)
Left upper extremity venous duplex has been completed. Preliminary results can be found in CV Proc through chart review.   09/01/22 4:30 PM [name] RVT

## 2022-09-02 ENCOUNTER — Inpatient Hospital Stay (HOSPITAL_COMMUNITY): Payer: Medicare HMO

## 2022-09-02 DIAGNOSIS — C786 Secondary malignant neoplasm of retroperitoneum and peritoneum: Secondary | ICD-10-CM | POA: Diagnosis not present

## 2022-09-02 DIAGNOSIS — Z7189 Other specified counseling: Secondary | ICD-10-CM

## 2022-09-02 DIAGNOSIS — R197 Diarrhea, unspecified: Secondary | ICD-10-CM | POA: Diagnosis not present

## 2022-09-02 DIAGNOSIS — K559 Vascular disorder of intestine, unspecified: Secondary | ICD-10-CM

## 2022-09-02 DIAGNOSIS — R1084 Generalized abdominal pain: Secondary | ICD-10-CM

## 2022-09-02 DIAGNOSIS — R112 Nausea with vomiting, unspecified: Secondary | ICD-10-CM | POA: Diagnosis not present

## 2022-09-02 LAB — CBC
HCT: 34.7 % — ABNORMAL LOW (ref 36.0–46.0)
Hemoglobin: 10.5 g/dL — ABNORMAL LOW (ref 12.0–15.0)
MCH: 31.3 pg (ref 26.0–34.0)
MCHC: 30.3 g/dL (ref 30.0–36.0)
MCV: 103.3 fL — ABNORMAL HIGH (ref 80.0–100.0)
Platelets: 231 10*3/uL (ref 150–400)
RBC: 3.36 MIL/uL — ABNORMAL LOW (ref 3.87–5.11)
RDW: 13.2 % (ref 11.5–15.5)
WBC: 18.3 10*3/uL — ABNORMAL HIGH (ref 4.0–10.5)
nRBC: 0 % (ref 0.0–0.2)

## 2022-09-02 LAB — BASIC METABOLIC PANEL
Anion gap: 8 (ref 5–15)
BUN: 24 mg/dL — ABNORMAL HIGH (ref 8–23)
CO2: 30 mmol/L (ref 22–32)
Calcium: 8.3 mg/dL — ABNORMAL LOW (ref 8.9–10.3)
Chloride: 107 mmol/L (ref 98–111)
Creatinine, Ser: 0.97 mg/dL (ref 0.44–1.00)
GFR, Estimated: >60 mL/min (ref >60)
Glucose, Bld: 148 mg/dL — ABNORMAL HIGH (ref 70–99)
Potassium: 4 mmol/L (ref 3.5–5.1)
Sodium: 145 mmol/L (ref 135–145)

## 2022-09-02 LAB — PHOSPHORUS: Phosphorus: 3 mg/dL (ref 2.5–4.6)

## 2022-09-02 LAB — GLUCOSE, CAPILLARY
Glucose-Capillary: 118 mg/dL — ABNORMAL HIGH (ref 70–99)
Glucose-Capillary: 131 mg/dL — ABNORMAL HIGH (ref 70–99)
Glucose-Capillary: 141 mg/dL — ABNORMAL HIGH (ref 70–99)
Glucose-Capillary: 149 mg/dL — ABNORMAL HIGH (ref 70–99)
Glucose-Capillary: 156 mg/dL — ABNORMAL HIGH (ref 70–99)
Glucose-Capillary: 175 mg/dL — ABNORMAL HIGH (ref 70–99)

## 2022-09-02 LAB — BODY FLUID CELL COUNT WITH DIFFERENTIAL
Eos, Fluid: 0 %
Lymphs, Fluid: 83 %
Monocyte-Macrophage-Serous Fluid: 12 % — ABNORMAL LOW (ref 50–90)
Neutrophil Count, Fluid: 5 % (ref 0–25)
Total Nucleated Cell Count, Fluid: 606 cu mm (ref 0–1000)

## 2022-09-02 LAB — PROTEIN, PLEURAL OR PERITONEAL FLUID: Total protein, fluid: 4.4 g/dL

## 2022-09-02 LAB — HEPARIN LEVEL (UNFRACTIONATED)
Heparin Unfractionated: <0.1 IU/mL — ABNORMAL LOW (ref 0.30–0.70)
Heparin Unfractionated: 0.33 IU/mL (ref 0.30–0.70)
Heparin Unfractionated: 0.18 IU/mL — ABNORMAL LOW (ref 0.30–0.70)

## 2022-09-02 LAB — MAGNESIUM: Magnesium: 2.3 mg/dL (ref 1.7–2.4)

## 2022-09-02 MED ORDER — HYDROMORPHONE HCL 1 MG/ML IJ SOLN
0.5000 mg | INTRAMUSCULAR | Status: DC | PRN
  Administered 2022-09-02 – 2022-09-04 (×5): 0.5 mg via INTRAVENOUS
  Filled 2022-09-02 (×6): qty 0.5

## 2022-09-02 MED ORDER — TRACE MINERALS CU-MN-SE-ZN 300-55-60-3000 MCG/ML IV SOLN
INTRAVENOUS | Status: DC
  Filled 2022-09-02: qty 624.8

## 2022-09-02 MED ORDER — LIDOCAINE HCL 1 % IJ SOLN
INTRAMUSCULAR | Status: AC
  Administered 2022-09-02: 12 mL
  Filled 2022-09-02: qty 20

## 2022-09-02 MED ORDER — HEPARIN BOLUS VIA INFUSION
2000.0000 [IU] | Freq: Once | INTRAVENOUS | Status: AC
  Administered 2022-09-02: 2000 [IU] via INTRAVENOUS
  Filled 2022-09-02: qty 2000

## 2022-09-02 MED ORDER — HEPARIN BOLUS VIA INFUSION
3000.0000 [IU] | Freq: Once | INTRAVENOUS | Status: AC
  Administered 2022-09-02: 3000 [IU] via INTRAVENOUS
  Filled 2022-09-02: qty 3000

## 2022-09-02 NOTE — Progress Notes (Signed)
ANTICOAGULATION CONSULT NOTE - follow up  Pharmacy Consult for IV heparin Indication: pulmonary embolus  Allergies  Allergen Reactions   Nsaids Other (See Comments)    Gastric pneumatosis    Patient Measurements: Height: '5\' 5"'$  (165.1 cm) Weight: 105.1 kg (231 lb 11.3 oz) IBW/kg (Calculated) : 57 Heparin Dosing Weight: 82 kg  Vital Signs: Temp: 97.8 F (36.6 C) (02/24 0231) Temp Source: Oral (02/24 0231) BP: 159/88 (02/24 0231) Pulse Rate: 107 (02/24 0231)  Labs: Recent Labs    08/30/22 1051 08/31/22 0448 09/01/22 0608 09/02/22 0125  HGB 10.4* 10.5*  --  10.5*  HCT 33.8* 34.3*  --  34.7*  PLT 274 210  --  231  HEPARINUNFRC  --   --   --  <0.10*  CREATININE  --  0.95 0.85 0.97     Estimated Creatinine Clearance: 63.1 mL/min (by C-G formula based on SCr of 0.97 mg/dL).   Medical History: Past Medical History:  Diagnosis Date   Diabetes mellitus type 2, noninsulin dependent ([place]) 08/20/2022   Generalized anxiety disorder 08/20/2022   History of panic attacks 08/20/2022   History of seizures 2013 08/20/2022   Hypertension    Hypertensive urgency in 2012 08/20/2022   Irritable bowel syndrome (IBS) 08/20/2022   Melanoma of back ([place]) 1997   Severe obesity (BMI 35.0-35.9 with comorbidity) ([place]) 08/20/2022    Medications:  Medications Prior to Admission  Medication Sig Dispense Refill Last Dose   acetaminophen (TYLENOL) 500 MG tablet Take 1,000 mg by mouth every 6 (six) hours as needed for mild pain.   unk   ALPRAZolam (XANAX) 0.25 MG tablet Take 0.25 mg by mouth 3 (three) times daily as needed. For anxiety    unk   citalopram (CELEXA) 20 MG tablet Take 20 mg by mouth daily.     08/19/2022   dextromethorphan (DELSYM) 30 MG/5ML liquid Take 60 mg by mouth at bedtime as needed for cough. For cough   unk   dicyclomine (BENTYL) 20 MG tablet Take 20 mg by mouth 2 (two) times daily as needed for spasms.   08/19/2022   famotidine (PEPCID AC MAXIMUM STRENGTH) 20 MG tablet  Take 20 mg by mouth daily as needed for heartburn or indigestion.   08/19/2022   hydrochlorothiazide (HYDRODIURIL) 12.5 MG tablet Take 12.5 mg by mouth daily.   08/19/2022   hydroxypropyl methylcellulose (ISOPTO TEARS) 2.5 % ophthalmic solution Place 1 drop into both eyes 3 (three) times daily as needed. For dry eyes    Past Week   loratadine (CLARITIN) 10 MG tablet Take 10 mg by mouth daily.     08/19/2022   losartan (COZAAR) 100 MG tablet Take 100 mg by mouth daily.   08/19/2022   metoprolol (LOPRESSOR) 50 MG tablet Take 50 mg by mouth 2 (two) times daily.     08/19/2022 at 1100   oxymetazoline (AFRIN) 0.05 % nasal spray Place 1 spray into both nostrils daily as needed for congestion.   Past Month   pravastatin (PRAVACHOL) 10 MG tablet Take 10 mg by mouth at bedtime.   Past Month   Scheduled:   Benzocaine   Mouth/Throat Once   bisacodyl  10 mg Rectal Daily   Chlorhexidine Gluconate Cloth  6 each Topical Daily   insulin aspart  0-15 Units Subcutaneous Q4H   lip balm   Topical BID   metoCLOPramide (REGLAN) injection  10 mg Intravenous Q6H   metoprolol tartrate  5 mg Intravenous Q6H   pantoprazole (PROTONIX) IV  40 mg Intravenous Q12H   simethicone  80 mg Oral QID   sodium chloride flush  10-40 mL Intracatheter Q12H   PRN: sodium chloride, acetaminophen **OR** acetaminophen, alum & mag hydroxide-simeth, diazepam, hydrALAZINE, HYDROcodone-acetaminophen, HYDROmorphone (DILAUDID) injection, magic mouthwash, menthol-cetylpyridinium, ondansetron (ZOFRAN) IV **OR** ondansetron (ZOFRAN) IV, mouth rinse, phenol, prochlorperazine, simethicone, sodium chloride flush, sodium chloride flush  Assessment: 46 yoF with PMH DM2, CVA, HTN, epilepsy, admitted for abdominal pain. 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 On day 13 of admission, patient developed SOB and CTA was performed. Image quality was poor, but study suggested  possible PE vs PNA. D-dimer returned markedly elevated and heparin was started per Pharmacy dosing.  Baseline INR, aPTT: not done Prior anticoagulation: none; SCDs only this admission  Significant events:  Today, 09/02/2022: HL < 0.1 despite 6000 units bolus CBC: Hgb low but stable; Plt stable WNL AKI on admission resolved; SCr now back to baseline (<1) No bleeding or interruptions per RN  Goal of Therapy: Heparin level 0.3-0.7 units/ml Monitor platelets by anticoagulation protocol: Yes  Plan: Heparin 3000 units IV bolus x 1 Increase Heparin drip to 1800 units/hr IV infusion Check heparin level 8 hrs  Daily CBC, daily heparin level once stable Monitor for signs of bleeding or thrombosis   Dolly Rias RPh 09/02/2022, 3:09 AM

## 2022-09-02 NOTE — Consult Note (Signed)
Consultation Note Date: 09/02/2022   Patient Name: [name]  DOB: [birth year]  MRN: [id number]  Age / Sex: 72 y.o., female  PCP: Mary Pepper, MD Referring Physician: Shelly Coss, MD  Reason for Consultation: Presented with severe abdominal pain, nausea, vomiting.  Has severe distended abdomen, no bowel movement.  On NG tube, TPN.  Omental biopsy confirmed high-grade serous carcinoma.  Oncology recommended palliative care consult.  HPI/Patient Profile: 72 y.o. female  with past medical history of IBS, HTN, M2, CVA, cholelithiasis admitted on 08/20/2022 with abdominal pain, nausea, vomiting. 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 She has had ongoing illeus. NG tube in place. Per Oncology- any chemo would be palliative in nature- patient not in current shape to receive chemo.       Primary Decision Maker NEXT OF KIN - daughter at bedside defers to patient's sister  Discussion: Chart reviewed including labs, progress notes, imaging from this and previous encounters.    On evaluation she woke briefly to my voice and then quickly returned to sleep. She was unable to participate in goals of care discussion. Her daughter, [name] was at bedside- I introduced Palliative medicine. [name] asked that I call patient's sister for further discussion and decision making.  I called patient's sister [name].  I called [name] by phone. Prior to admission [name] was living at home with her spouse and daughter. Her spouse is disabled. [name] is surrogate decision maker due to spouse's disability and daughter's request.   We discussed Florice's current illness- new cancer diagnosis.  [name] would like to have meeting with patient and her daughter present to  discuss further goals of care.  Comfort is a priority, but wishes to continue life prolonging care until our meeting. We discussed hospice philosophy and services. She may be eligible for residential hospice.  Code status was discussed. [place] would not want to be put on life support or have more aggressive interventions to prolong her life in the event that her condition worsened. She would not want CPR.  I discussed DNR order and [name] is in agreement.   SUMMARY OF RECOMMENDATIONS -DNR -Continue current plan of care -f/u meeting on 2/27 @ 3pm    Code Status/Advance Care Planning: DNR   Prognosis:   Unable to determine  Discharge Planning: To Be Determined  Primary Diagnoses: Present on Admission:  AKI (acute kidney injury) ([place])  Hyperglycemia  Ascites  Pneumobilia  Generalized anxiety disorder  Nausea, vomiting, and diarrhea  Slow rate of speech - history of CVA 2012  Abdominal pain, diffuse  Hypertension  Morbid obesity ([place])  Cholelithiasis   Review of Systems  Physical Exam  Vital Signs: BP (!) 156/91 (BP Location: Right Arm)   Pulse (!) 127   Temp 99.7 F (37.6 C) (Oral)   Resp 20   Ht '5\' 5"'$  (1.651 m)   Wt 102.1 kg   SpO2 94%  BMI 37.46 kg/m  Pain Scale: 0-10 POSS *See Group Information*: S-Acceptable,Sleep, easy to arouse Pain Score: 1    SpO2: SpO2: 94 % O2 Device:SpO2: 94 % O2 Flow Rate: .O2 Flow Rate (L/min): 3 L/min  IO: Intake/output summary:  Intake/Output Summary (Last 24 hours) at 09/02/2022 1310 Last data filed at 09/02/2022 0826 Gross per 24 hour  Intake 1938.68 ml  Output 910 ml  Net 1028.68 ml    LBM: Last BM Date : 08/20/22 Baseline Weight: Weight: 99.8 kg Most recent weight: Weight: 102.1 kg       Thank you for this consult. Palliative medicine will continue to follow and assist as needed.  Time Total: 90 minutes Greater than 50%  of this time was spent counseling and coordinating care related to the above assessment and  plan.  Signed by: Mariana Kaufman, AGNP-C Palliative Medicine    Please contact Palliative Medicine Team phone at [phone] for questions and concerns.  For individual provider: See Shea Evans

## 2022-09-02 NOTE — Progress Notes (Signed)
ANTICOAGULATION CONSULT NOTE - follow up  Pharmacy Consult for IV heparin Indication: pulmonary embolus  Allergies  Allergen Reactions   Nsaids Other (See Comments)    Gastric pneumatosis    Patient Measurements: Height: '5\' 5"'$  (165.1 cm) Weight: 102.1 kg (225 lb 1.6 oz) IBW/kg (Calculated) : 57 Heparin Dosing Weight: 82 kg  Vital Signs: Temp: 97.6 F (36.4 C) (02/24 2009) Temp Source: Oral (02/24 2009) BP: 147/88 (02/24 2009) Pulse Rate: 124 (02/24 2009)  Labs: Recent Labs    08/31/22 0448 09/01/22 0608 09/02/22 0125 09/02/22 1112 09/02/22 1911  HGB 10.5*  --  10.5*  --   --   HCT 34.3*  --  34.7*  --   --   PLT 210  --  231  --   --   HEPARINUNFRC  --   --  <0.10* 0.33 0.18*  CREATININE 0.95 0.85 0.97  --   --      Estimated Creatinine Clearance: 62.1 mL/min (by C-G formula based on SCr of 0.97 mg/dL).   Medical History: Past Medical History:  Diagnosis Date   Diabetes mellitus type 2, noninsulin dependent ([place]) 08/20/2022   Generalized anxiety disorder 08/20/2022   History of panic attacks 08/20/2022   History of seizures 2013 08/20/2022   Hypertension    Hypertensive urgency in 2012 08/20/2022   Irritable bowel syndrome (IBS) 08/20/2022   Melanoma of back ([place]) 1997   Severe obesity (BMI 35.0-35.9 with comorbidity) ([place]) 08/20/2022    Medications:  Medications Prior to Admission  Medication Sig Dispense Refill Last Dose   acetaminophen (TYLENOL) 500 MG tablet Take 1,000 mg by mouth every 6 (six) hours as needed for mild pain.   unk   ALPRAZolam (XANAX) 0.25 MG tablet Take 0.25 mg by mouth 3 (three) times daily as needed. For anxiety    unk   citalopram (CELEXA) 20 MG tablet Take 20 mg by mouth daily.     08/19/2022   dextromethorphan (DELSYM) 30 MG/5ML liquid Take 60 mg by mouth at bedtime as needed for cough. For cough   unk   dicyclomine (BENTYL) 20 MG tablet Take 20 mg by mouth 2 (two) times daily as needed for spasms.   08/19/2022   famotidine  (PEPCID AC MAXIMUM STRENGTH) 20 MG tablet Take 20 mg by mouth daily as needed for heartburn or indigestion.   08/19/2022   hydrochlorothiazide (HYDRODIURIL) 12.5 MG tablet Take 12.5 mg by mouth daily.   08/19/2022   hydroxypropyl methylcellulose (ISOPTO TEARS) 2.5 % ophthalmic solution Place 1 drop into both eyes 3 (three) times daily as needed. For dry eyes    Past Week   loratadine (CLARITIN) 10 MG tablet Take 10 mg by mouth daily.     08/19/2022   losartan (COZAAR) 100 MG tablet Take 100 mg by mouth daily.   08/19/2022   metoprolol (LOPRESSOR) 50 MG tablet Take 50 mg by mouth 2 (two) times daily.     08/19/2022 at 1100   oxymetazoline (AFRIN) 0.05 % nasal spray Place 1 spray into both nostrils daily as needed for congestion.   Past Month   pravastatin (PRAVACHOL) 10 MG tablet Take 10 mg by mouth at bedtime.   Past Month   Scheduled:   Benzocaine   Mouth/Throat Once   bisacodyl  10 mg Rectal Daily   Chlorhexidine Gluconate Cloth  6 each Topical Daily   heparin  2,000 Units Intravenous Once   insulin aspart  0-15 Units Subcutaneous Q4H   lip balm  Topical BID   metoCLOPramide (REGLAN) injection  10 mg Intravenous Q6H   metoprolol tartrate  5 mg Intravenous Q6H   pantoprazole (PROTONIX) IV  40 mg Intravenous Q12H   simethicone  80 mg Oral QID   sodium chloride flush  10-40 mL Intracatheter Q12H   PRN: sodium chloride, acetaminophen **OR** acetaminophen, alum & mag hydroxide-simeth, diazepam, hydrALAZINE, HYDROcodone-acetaminophen, HYDROmorphone (DILAUDID) injection, magic mouthwash, menthol-cetylpyridinium, ondansetron (ZOFRAN) IV **OR** ondansetron (ZOFRAN) IV, mouth rinse, phenol, prochlorperazine, simethicone, sodium chloride flush, sodium chloride flush  Assessment: 61 yoF with PMH DM2, CVA, HTN, epilepsy, admitted for abdominal pain. 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 On day 13 of admission,  patient developed SOB and CTA was performed. Image quality was poor, but study suggested possible PE vs PNA. D-dimer returned markedly elevated and heparin was started per Pharmacy dosing.  Baseline INR, aPTT: not done Prior anticoagulation: none; SCDs only this admission  Today, 09/02/2022: - Heparin level 0.18 - subtherapeutic on heparin at 1800 units/hr - CBC stable - Per pt's RN, no bleeding or infusion issues noted, does not appear heparin level drawn incorrectly - Pt had left thoracentesis  at ~10:47a this morning   Goal of Therapy: Heparin level 0.3-0.7 units/ml Monitor platelets by anticoagulation protocol: Yes  Plan: - Heparin 2000 unit bolus - Increase heparin infusion to 2000 units/hr - Re-check heparin level ~ 6 hours after rate change - Monitor for signs of bleeding or thrombosis  Tawnya Crook, PharmD, BCPS Clinical Pharmacist 09/02/2022 8:54 PM

## 2022-09-02 NOTE — Progress Notes (Signed)
ANTICOAGULATION CONSULT NOTE - follow up  Pharmacy Consult for IV heparin Indication: pulmonary embolus  Allergies  Allergen Reactions   Nsaids Other (See Comments)    Gastric pneumatosis    Patient Measurements: Height: '5\' 5"'$  (165.1 cm) Weight: 102.1 kg (225 lb 1.6 oz) IBW/kg (Calculated) : 57 Heparin Dosing Weight: 82 kg  Vital Signs: Temp: 98.6 F (37 C) (02/24 0846) Temp Source: Oral (02/24 0846) BP: 164/86 (02/24 0846) Pulse Rate: 118 (02/24 0846)  Labs: Recent Labs    08/30/22 1051 08/31/22 0448 09/01/22 0608 09/02/22 0125  HGB 10.4* 10.5*  --  10.5*  HCT 33.8* 34.3*  --  34.7*  PLT 274 210  --  231  HEPARINUNFRC  --   --   --  <0.10*  CREATININE  --  0.95 0.85 0.97     Estimated Creatinine Clearance: 62.1 mL/min (by C-G formula based on SCr of 0.97 mg/dL).   Medical History: Past Medical History:  Diagnosis Date   Diabetes mellitus type 2, noninsulin dependent ([place]) 08/20/2022   Generalized anxiety disorder 08/20/2022   History of panic attacks 08/20/2022   History of seizures 2013 08/20/2022   Hypertension    Hypertensive urgency in 2012 08/20/2022   Irritable bowel syndrome (IBS) 08/20/2022   Melanoma of back ([place]) 1997   Severe obesity (BMI 35.0-35.9 with comorbidity) ([place]) 08/20/2022    Medications:  Medications Prior to Admission  Medication Sig Dispense Refill Last Dose   acetaminophen (TYLENOL) 500 MG tablet Take 1,000 mg by mouth every 6 (six) hours as needed for mild pain.   unk   ALPRAZolam (XANAX) 0.25 MG tablet Take 0.25 mg by mouth 3 (three) times daily as needed. For anxiety    unk   citalopram (CELEXA) 20 MG tablet Take 20 mg by mouth daily.     08/19/2022   dextromethorphan (DELSYM) 30 MG/5ML liquid Take 60 mg by mouth at bedtime as needed for cough. For cough   unk   dicyclomine (BENTYL) 20 MG tablet Take 20 mg by mouth 2 (two) times daily as needed for spasms.   08/19/2022   famotidine (PEPCID AC MAXIMUM STRENGTH) 20 MG tablet  Take 20 mg by mouth daily as needed for heartburn or indigestion.   08/19/2022   hydrochlorothiazide (HYDRODIURIL) 12.5 MG tablet Take 12.5 mg by mouth daily.   08/19/2022   hydroxypropyl methylcellulose (ISOPTO TEARS) 2.5 % ophthalmic solution Place 1 drop into both eyes 3 (three) times daily as needed. For dry eyes    Past Week   loratadine (CLARITIN) 10 MG tablet Take 10 mg by mouth daily.     08/19/2022   losartan (COZAAR) 100 MG tablet Take 100 mg by mouth daily.   08/19/2022   metoprolol (LOPRESSOR) 50 MG tablet Take 50 mg by mouth 2 (two) times daily.     08/19/2022 at 1100   oxymetazoline (AFRIN) 0.05 % nasal spray Place 1 spray into both nostrils daily as needed for congestion.   Past Month   pravastatin (PRAVACHOL) 10 MG tablet Take 10 mg by mouth at bedtime.   Past Month   Scheduled:   Benzocaine   Mouth/Throat Once   bisacodyl  10 mg Rectal Daily   Chlorhexidine Gluconate Cloth  6 each Topical Daily   insulin aspart  0-15 Units Subcutaneous Q4H   lip balm   Topical BID   metoCLOPramide (REGLAN) injection  10 mg Intravenous Q6H   metoprolol tartrate  5 mg Intravenous Q6H   pantoprazole (PROTONIX) IV  40 mg Intravenous Q12H   simethicone  80 mg Oral QID   sodium chloride flush  10-40 mL Intracatheter Q12H   PRN: sodium chloride, acetaminophen **OR** acetaminophen, alum & mag hydroxide-simeth, diazepam, hydrALAZINE, HYDROcodone-acetaminophen, HYDROmorphone (DILAUDID) injection, magic mouthwash, menthol-cetylpyridinium, ondansetron (ZOFRAN) IV **OR** ondansetron (ZOFRAN) IV, mouth rinse, phenol, prochlorperazine, simethicone, sodium chloride flush, sodium chloride flush  Assessment: 38 yoF with PMH DM2, CVA, HTN, epilepsy, admitted for abdominal pain. 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 On day 13 of admission, patient developed SOB and CTA was performed. Image quality was poor, but study suggested  possible PE vs PNA. D-dimer returned markedly elevated and heparin was started per Pharmacy dosing.  Baseline INR, aPTT: not done Prior anticoagulation: none; SCDs only this admission  Today, 09/02/2022: - heparin level collected at 11:12a is therapeutic at 0.33 with rate increased to 1800 units/hr this morning  - CBC stable - [er pt's RN no bleeding noted - pt had left thoracentesis  at ~10:47a this morning   Goal of Therapy: Heparin level 0.3-0.7 units/ml Monitor platelets by anticoagulation protocol: Yes  Plan: -  Continue heparin drip at 1800 units/hr - re-check heparin level at 7p to ensure level is still at goal before changing to daily monitoring  - Monitor for signs of bleeding or thrombosis   Dia Sitter, PharmD, BCPS 09/02/2022 9:56 AM

## 2022-09-02 NOTE — Progress Notes (Signed)
PHARMACY - TOTAL PARENTERAL NUTRITION CONSULT NOTE   Indication:  Intolerance to enteral feeding  Patient Measurements: Height: '5\' 5"'$  (165.1 cm) Weight: 102.1 kg (225 lb 1.6 oz) IBW/kg (Calculated) : 57 TPN AdjBW (KG): 67.7 Body mass index is 37.46 kg/m.  Assessment:  Pt is a 19 yoF admitted on 2/11 with abdominal pain, N/V ongoing for several weeks. Imaging revealed gastric pneumatosis, penumobilia - no indication for surgery at this time. Pt has had minimal PO intake > 1 week. Pharmacy consulted to initiate TPN.  Glucose / Insulin: T2DM. CBG goal < 150 - on mSSI q4h (used 13 units in the oast 24 hrs) - CBG range: 141-175  Electrolytes:  - Na down to wnl (145) -Ca low 8.3 with normal albumin - however, expect Albumin values on 2/20 and 2/22 are falsely elevated 2/2 albumin IV q8h. Previous albumin low at 2.3 on 2/16.  -All other lytes WNL Renal: SCr <1; BUN up 24 Hepatic: LFTs on 2/22: T.bili slightly elevated. Alk Phos, LFTs WNL.  - Alb WNL (note: was on albumin TID from 2/15 > 2/22) - TG 105 (2/22) Intake / Output; MIVF: mIVF: NS @ KVO - I/O: +848 ml -UOP: 740 mL.  - Emesis/NG output: 560 mL  GI Imaging: -2/11 CT Abd: Portal vein and mesenteric venous gas. Mild pneumatosis in the gastric wall without evidence of gastric wall thickening. Moderate wall thickening involving transverse colon (differential includes infectious or ischemic colitis or neoplasm). Moderate ascites, cholethiasis.  -2/18 CT Abd: Persistent mild ascites (infectious or neoplastic etiology?). Improved appearance of transverse colon, no evidence of perforation. Resolution of gastric pneumatosis as well as portal and mesenteric venous gas. -2/23 chest CT: Potential eccentric nonocclusive thrombus in the LEFT lower lobe pulmonary artery. Ascites in the upper abdomen with loculated fluid in the gastrohepatic ligament and omental stranding.  GI Surgeries / Procedures:  -2/21: CT core omental biopsy  Central  access: Double lumen PICC placed 2/20 TPN start date: 2/20   Nutritional Goals: Goal TPN rate is 55 mL/hr (provides 93 g of protein and 1790 kcals per day)  Discussed with MD on 2/20: Using Clinisol 15% to concentrate TPN to be able to meet nutritional needs with minimal volume.  Discussed again with MD on 2/21 - reports pt remains volume overloaded with ascites and crackles. Planning to continue with concentrated TPN and MD to let pharmacy know if plan changes.   RD Assessment: Estimated Needs Total Energy Estimated Needs: 1800-2000 kcals Total Protein Estimated Needs: 80-100 grams Total Fluid Estimated Needs: >/= 1.7L  Current Nutrition:  NPO except for sips with meds, ice chips TPN  Significant Events:  -2/23: Pt pulled PICC line early this morning ~0300. TPN order discontinued. MD placed order for new PICC.    Plan:   At 1800: Continue TPN at goal rate of 55 mL/hr Electrolytes in TPN:  Na 25 mEq/L K 61mq/L Add Ca 2.5 mEq/L Mg 527m/L Phos 1535m/L.  Cl:Ac Max Cl (1:1.13) Add standard MVI and trace elements to TPN.  Thiamine in TPN x5 days (2/20 > 2/24) Continue SSI at moderate q4h and adjust as [facility] MD. Monitor TPN labs on Mon/Thurs. Recheck electrolytes with AM labs on Sunday.   PhaLynelle DoctorharmD 09/02/2022,9:32 AM

## 2022-09-02 NOTE — Procedures (Signed)
PROCEDURE SUMMARY:  Successful image-guided left thoracentesis. Yielded 800 mL of hazy amber fluid. Pt tolerated procedure well. No immediate complications. EBL = trace   Specimen was  sent for labs. CXR ordered.  Please see imaging section of Epic for full dictation.  Armando Gang Syanna Remmert PA-C 09/02/2022 10:47 AM

## 2022-09-02 NOTE — Progress Notes (Signed)
PROGRESS NOTE  [name]  [id number] DOB: 24-Aug-1950 DOA: 08/20/2022 PCP: London Pepper, MD   Brief Narrative:  Patient is a 72 year old female with history of obesity, diabetes type 2, anxiety, depression, CVA, hypertension, seizure disorder who presented with worsening abdominal pain for 3 weeks, decreased appetite, unintentional weight loss, diarrhea, vomiting.  On presentation, she had leukocytosis and elevated lactate of 2.2. CT abdomen/pelvis showed possible infectious or ischemic colitis indicated by colonic wall thickening, suspected peritoneal carcinomatosis due to presence of ascites, omental stranding, mesenteric venous gas and pneumatosis.  General surgery consulted.    IR did omental biopsy, elevated CA125 for suspected malignancy.  Biopsy showed high-grade serous carcinoma.  Hospital course remarkable for persistent acute hypoxic respiratory failure, CT angiogram showed left-sided PE, left-sided consolidation, left-sided pleural effusion.  Remains on TPN, NG tube.  Palliative care consulted as per oncology recommendation  Assessment & Plan:  Principal Problem:   Nausea, vomiting, and diarrhea Active Problems:   Hypertension   AKI (acute kidney injury) ([place])   Morbid obesity ([place])   Hyperglycemia   Ascites   Pneumobilia   Diabetes mellitus type 2, noninsulin dependent ([place])   Generalized anxiety disorder   Cholelithiasis   Slow rate of speech - history of CVA 2012   Abdominal pain, diffuse  Abdominal  pain/pneumobilia/gastric pneumatosis/high-grade serous carcinoma: Presented with nausea, vomiting, diarrhea, abdominal pain, weight loss.  Found to have ascites, underwent paracentesis x3.  Imaging showed possible colitis/peritoneal carcinomatosis, omental stranding.  .  Cytology showed atypical cells.  CA 19-9, CEA normal but CEA-125 elevated.  IR did omental biopsy on 2/21, biopsy report showed high-grade serous carcinoma Currently n.p.o., NG tube in place. General  surgery closely following.  Continue pain management, supportive care.  Antibiotics discontinued. Continue on TPN. Abdomen remains distended.. GI was also following earlier, now signed off. Oncology consulted and following.  Recommended palliative care consultation given her poor prognosis.  Acute hypoxic respiratory failure: Currently requiring 2 to 4 L of oxygen per minute.  ABG done on 2/20 shows normal pCO2, normal O2.  Chest x-ray done on 2/21 showed  low lung volumes with mild prominence of pulmonary vasculature .  Patient had crackles, elevated BNP.  Given a dose of Lasix 40 mg once with significant output. Continued to complain of shortness of breath and she remains in mild sinus tachycardia.  CT angiogram done on 2/23 showed left-sided nonocclusive PE, left-sided consolidation, effusion Status post US guided thoracentesis with 800 mL fluid removal on 2/24.  Also started on broad spectrum antibiotics.  Currently on cefepime  Macrocytosis/vitamin B12 deficiency: Low vitamin B12.  Given a dose of IM vitamin B12 injection.  Continue oral supplementation on dc when able to take by mouth  AKI on CKD stage IIIa: Currently kidney function at baseline.  Baseline creatinine around 1.1.  Losartan, hydrochlorothiazide on hold  Hypertension/sinus tachycardia: Remains hypertensive,tachycardiac.  On IV metoprolol.  Takes metoprolol, losartan, hydrochlorothiazide at home.Added prn hydralazine  Elevated liver enzymes/hyperbilirubinemia: Suspected to be from hypoperfusion.  Currently improved  Hypophosphatemia: Being monitored and supplemented as needed  Diabetes type 2: Currently sugars are stable.  Continue sliding scale and monitor  Anxiety: On as needed Xanax  History of prior CVA  Morbid obesity: BMI 39  Goals of care: Has miserable condition with distended abdomen, no bowel movement, and NG tube.  Persistent abdominal pain.  Case discussed with oncology, consulted palliative care for goals  of care.  Poor prognosis.  Dr. Chryl Heck planning to call the sister  today    Nutrition Problem: Inadequate oral intake Etiology: inability to eat    DVT prophylaxis:SCDs Start: 08/20/22 1402     Code Status: Full Code  Family Communication: Called and discussed with sister on phone on 2/23  Patient status:Inpatient  Patient is from :Home  Anticipated discharge to:not sure  Estimated DC date:not sure, still on NG tube, TPN.  palliaitve care consulted  Consultants: Surgery and IR   Procedures: Paracentesis, omental biopsy  Antimicrobials:  Anti-infectives (From admission, onward)    Start     Dose/Rate Route Frequency Ordered Stop   09/02/22 1800  vancomycin (VANCOREADY) IVPB 1250 mg/250 mL  Status:  Discontinued        1,250 mg 166.7 mL/hr over 90 Minutes Intravenous Daily-1800 09/01/22 1932 09/02/22 0925   09/01/22 2200  vancomycin (VANCOCIN) IVPB 1000 mg/200 mL premix        1,000 mg 200 mL/hr over 60 Minutes Intravenous  Once 09/01/22 1932 09/01/22 2228   09/01/22 1800  ceFEPIme (MAXIPIME) 2 g in sodium chloride 0.9 % 100 mL IVPB        2 g 200 mL/hr over 30 Minutes Intravenous Every 8 hours 09/01/22 1739     09/01/22 1800  vancomycin (VANCOCIN) IVPB 1000 mg/200 mL premix        1,000 mg 200 mL/hr over 60 Minutes Intravenous  Once 09/01/22 1747 09/01/22 1939   08/20/22 2000  piperacillin-tazobactam (ZOSYN) IVPB 3.375 g        3.375 g 12.5 mL/hr over 240 Minutes Intravenous Every 8 hours 08/20/22 1325 08/25/22 1918   08/20/22 1130  piperacillin-tazobactam (ZOSYN) IVPB 3.375 g        3.375 g 100 mL/hr over 30 Minutes Intravenous  Once 08/20/22 1126 08/20/22 1223       Subjective: Patient seen and examined at bedside today.  Hemodynamically stable.  Still on 2 to 3 L of oxygen per minute.  She is little confused today.  She was agitated and was kept in restraints.  She was given sedating medications.  Abdomen remains distended.  Slow bowel sounds heard  today.  Objective: Vitals:   09/02/22 0846 09/02/22 1040 09/02/22 1100 09/02/22 1144  BP: (!) 164/86 114/72 129/87 (!) 156/91  Pulse: (!) 118   (!) 127  Resp: 20   20  Temp: 98.6 F (37 C)   99.7 F (37.6 C)  TempSrc: Oral   Oral  SpO2: 94%   94%  Weight:      Height:        Intake/Output Summary (Last 24 hours) at 09/02/2022 1151 Last data filed at 09/02/2022 [id number] Gross per 24 hour  Intake 1938.68 ml  Output 910 ml  Net 1028.68 ml   Filed Weights   08/31/22 0406 09/01/22 0500 09/02/22 0500  Weight: 107.7 kg 105.1 kg 102.1 kg    Examination:  General exam: Drowsy/sleepy, on restraints, obese HEENT: NG tube Respiratory system: Diminished sounds bilaterally, no wheezes or crackles  Cardiovascular system: Sinus tachycardia Gastrointestinal system: Abdomen is distended, mostly nontender, slow bowel sounds heard  Central nervous system: Not alert or oriented Extremities: No edema, no clubbing ,no cyanosis, PICC line on the left Skin: No rashes, no ulcers,no icterus      Data Reviewed: I have personally reviewed following labs and imaging studies  CBC: Recent Labs  Lab 08/27/22 0828 08/28/22 0316 08/30/22 1051 08/31/22 0448 09/02/22 0125  WBC 13.3* 13.7* 12.5* 12.1* 18.3*  HGB 10.8* 11.3* 10.4* 10.5* 10.5*  HCT 34.0*  36.0 33.8* 34.3* 34.7*  MCV 101.2* 100.6* 101.2* 102.7* 103.3*  PLT 254 255 274 210 [id number]   Basic Metabolic Panel: Recent Labs  Lab 08/29/22 0331 08/30/22 0141 08/31/22 0448 09/01/22 0608 09/02/22 0125  NA 142 141 146* 147* 145  K 3.8 3.7 3.8 4.2 4.0  CL 107 108 106 108 107  CO2 '26 28 31 28 30  '$ GLUCOSE 109* 140* 139* 133* 148*  BUN '19 19 17 11 '$ 24*  CREATININE 0.94 0.82 0.95 0.85 0.97  CALCIUM 8.4* 8.3* 8.9 9.0 8.3*  MG 2.2 2.2 2.2 1.8 2.3  PHOS 2.9 2.6 2.8 2.3* 3.0     Recent Results (from the past 240 hour(s))  MRSA Next Gen by PCR, Nasal     Status: None   Collection Time: 09/01/22  3:46 PM   Specimen: Nasal Mucosa; Nasal Swab   Result Value Ref Range Status   MRSA by PCR Next Gen NOT DETECTED NOT DETECTED Final    Comment: (NOTE) The GeneXpert MRSA Assay (FDA approved for NASAL specimens only), is one component of a comprehensive MRSA colonization surveillance program. It is not intended to diagnose MRSA infection nor to guide or monitor treatment for MRSA infections. Test performance is not FDA approved in patients less than 80 years old. Performed at [facility], [place] [address] [place], [place] [address]      Radiology Studies: US THORACENTESIS ASP PLEURAL SPACE W/IMG GUIDE  Result Date: 09/02/2022 INDICATION: 72 year old female presents with shortness of breath. Previous imaging showed left PE and large left pleural effusion. Request for therapeutic and diagnostic thoracentesis. EXAM: ULTRASOUND GUIDED LEFT THORACENTESIS MEDICATIONS: 10 mL 1% lidocaine COMPLICATIONS: None immediate. PROCEDURE: An ultrasound guided thoracentesis was thoroughly discussed with the patient and questions answered. The benefits, risks, alternatives and complications were also discussed. The patient understands and wishes to proceed with the procedure. Written consent was obtained. Ultrasound was performed to localize and mark an adequate pocket of fluid in the left chest. The area was then prepped and draped in the normal sterile fashion. 1% Lidocaine was used for local anesthesia. Under ultrasound guidance a 19 gauge, 10-cm, Yueh catheter was introduced. Thoracentesis was performed. The catheter was removed and a dressing applied. FINDINGS: A total of approximately 800 mL of hazy amber fluid was removed. Samples were sent to the laboratory as requested by the clinical team. Post procedure chest X-ray reviewed, negative for pneumothorax. IMPRESSION: Successful ultrasound guided left thoracentesis yielding 800 mL of pleural fluid. Read by: Durenda Guthrie, PA-C Electronically Signed   By: Jacqulynn Cadet M.D.   On: 09/02/2022  11:23   DG CHEST PORT 1 VIEW  Result Date: 09/02/2022 CLINICAL DATA:  Post thoracentesis. EXAM: PORTABLE CHEST 1 VIEW COMPARISON:  Chest x-ray dated 08/30/2022. Chest CT dated 09/01/2022. FINDINGS: Significantly improved aeration at the LEFT lung base status post thoracentesis. No pneumothorax is seen. RIGHT lung is clear. Heart size and mediastinal contours are stable. Enteric tube passes below the diaphragm. LEFT-sided PICC line in place with tip adequately positioned at the level of the mid/upper SVC. IMPRESSION: Significantly improved aeration at the LEFT lung base status post thoracentesis. No pneumothorax seen. Electronically Signed   By: Franki Cabot M.D.   On: 09/02/2022 11:01   DG Abd Portable 1V  Result Date: 09/02/2022 CLINICAL DATA:  72 year old female status post nasogastric tube placement. EXAM: PORTABLE ABDOMEN - 1 VIEW COMPARISON:  09/01/2022. FINDINGS: Nasogastric tube tip in the distal body of the stomach with side port in  the proximal stomach. Moderate to large left pleural effusion with atelectasis and/or consolidation in the base of the left lung. Visualized bowel-gas pattern in the upper abdomen is unremarkable. Lower abdomen is incompletely imaged. IMPRESSION: 1. Nasogastric tube positioned in the stomach, as above. Electronically Signed   By: Vinnie Langton M.D.   On: 09/02/2022 06:57   VAS [place] UPPER EXTREMITY VENOUS DUPLEX  Result Date: 09/01/2022 UPPER VENOUS STUDY  Patient Name:  [name]  Date of Exam:   09/01/2022 Medical Rec #: [id number]     Accession #:    [id number] Date of Birth: 1951-03-30     Patient Gender: F Patient Age:   89 years Exam Location:  [facility] Procedure:      VAS [place] UPPER EXTREMITY VENOUS DUPLEX Referring Phys: Adonias Demore --------------------------------------------------------------------------------  Indications: pulmonary embolism Limitations: Poor ultrasound/tissue interface, bandages and line. Comparison Study: No prior studies.  Performing Technologist: Oliver Hum RVT  Examination Guidelines: A complete evaluation includes B-mode imaging, spectral Doppler, color Doppler, and power Doppler as needed of all accessible portions of each vessel. Bilateral testing is considered an integral part of a complete examination. Limited examinations for reoccurring indications may be performed as noted.  Right Findings: +----------+------------+---------+-----------+----------+-------+ RIGHT     CompressiblePhasicitySpontaneousPropertiesSummary +----------+------------+---------+-----------+----------+-------+ Subclavian    Full       Yes       Yes                      +----------+------------+---------+-----------+----------+-------+  Left Findings: +----------+------------+---------+-----------+----------+-------+ LEFT      CompressiblePhasicitySpontaneousPropertiesSummary +----------+------------+---------+-----------+----------+-------+ IJV           Full       Yes       Yes                      +----------+------------+---------+-----------+----------+-------+ Subclavian    Full       Yes       Yes                      +----------+------------+---------+-----------+----------+-------+ Axillary      Full       Yes       Yes                      +----------+------------+---------+-----------+----------+-------+ Brachial      Full       Yes       Yes                      +----------+------------+---------+-----------+----------+-------+ Radial        Full                                          +----------+------------+---------+-----------+----------+-------+ Ulnar         Full                                          +----------+------------+---------+-----------+----------+-------+ Cephalic      Full                                          +----------+------------+---------+-----------+----------+-------+ Basilic  Full                                           +----------+------------+---------+-----------+----------+-------+  Summary:  Right: No evidence of thrombosis in the subclavian.  Left: No evidence of deep vein thrombosis in the upper extremity. No evidence of superficial vein thrombosis in the upper extremity.  *See table(s) above for measurements and observations.  Diagnosing physician: Orlie Pollen Electronically signed by Orlie Pollen on 09/01/2022 at 6:57:52 PM.    Final    US Paracentesis  Result Date: 09/01/2022 INDICATION: Patient with history of chronic kidney disease, abdominal pain, omental stranding, recurrent ascites. Request received for therapeutic paracentesis. EXAM: ULTRASOUND GUIDED THERAPEUTIC PARACENTESIS MEDICATIONS: 8 ml 1% lidocaine COMPLICATIONS: None immediate. PROCEDURE: Informed written consent was obtained from the patient after a discussion of the risks, benefits and alternatives to treatment. A timeout was performed prior to the initiation of the procedure. Initial ultrasound scanning demonstrates a moderate amount of ascites within the right lower abdominal quadrant. The right lower abdomen was prepped and draped in the usual sterile fashion. 1% lidocaine was used for local anesthesia. Following this, a 19 gauge, 10-cm, Yueh catheter was introduced. An ultrasound image was saved for documentation purposes. The paracentesis was performed. The catheter was removed and a dressing was applied. The patient tolerated the procedure well without immediate post procedural complication. FINDINGS: A total of approximately 2.3 liters of amber fluid was removed. IMPRESSION: Successful ultrasound-guided therapeutic paracentesis yielding 2.3 liters of peritoneal fluid. Read by: Rowe Robert, PA-C Electronically Signed   By: Aletta Edouard M.D.   On: 09/01/2022 16:43   CT Angio Chest Pulmonary Embolism (PE) W or WO Contrast  Addendum Date: 09/01/2022   ADDENDUM REPORT: 09/01/2022 15:04 ADDENDUM: These results were called by telephone at  the time of interpretation on 09/01/2022 at 3:04 pm to provider Jaelin Devincentis [facility] , who verbally acknowledged these results. Electronically Signed   By: Zetta Bills M.D.   On: 09/01/2022 15:04   Result Date: 09/01/2022 CLINICAL DATA:  Pulmonary embolism, shortness of breath. EXAM: CT ANGIOGRAPHY CHEST WITH CONTRAST TECHNIQUE: Multidetector CT imaging of the chest was performed using the standard protocol during bolus administration of intravenous contrast. Multiplanar CT image reconstructions and MIPs were obtained to evaluate the vascular anatomy. RADIATION DOSE REDUCTION: This exam was performed according to the departmental dose-optimization program which includes automated exposure control, adjustment of the mA and/or kV according to patient size and/or use of iterative reconstruction technique. CONTRAST:  5m OMNIPAQUE IOHEXOL 350 MG/ML SOLN COMPARISON:  Chest radiograph of August 30, 2022 and CT of the abdomen and pelvis which was performed on August 27, 2022. FINDINGS: Cardiovascular: The aorta displays a normal three-vessel branching pattern. Aortic atherosclerosis both calcified and noncalcified without dilation. Normal heart size without pericardial effusion or nodularity. Central pulmonary vasculature is opacified to 174 Hounsfield units, study is limited by bolus timing. No central pulmonary embolism. w eccentric filling defect in LEFT lower lobe pulmonary artery on image 46/4. Potential eccentric thrombi in RIGHT lower lobe segmental branches on image 55/4 Non opacification of peripheral LEFT lower lobe pulmonary artery leading into area dense consolidative change. In general peripheral branches of the pulmonary arterial bed are not well opacified elsewhere in the chest likely due to bolus timing factors but LEFT lower lobe is certainly more pronounced than other areas. LEFT-sided PICC line terminates at the midportion  of the superior vena cava. Mediastinum/Nodes: No adenopathy in the chest.  Lungs/Pleura: Dense LEFT lower lobe consolidative change with large LEFT-sided pleural effusion. No pleural effusion in the RIGHT chest. Mild interstitial thickening and ground-glass in the upper lobes. Upper Abdomen: Loculated fluid in the gastrohepatic ligament. Signs of gastric edema. Perihepatic ascites and partially visualized stranding or nodularity in the omentum. No acute upper abdominal process otherwise to the extent evaluated. Musculoskeletal: No chest wall mass. No acute bone finding or destructive bone process. Review of the MIP images confirms the above findings. IMPRESSION: 1. Study limited by bolus timing factors. No central pulmonary embolism. 2. Potential eccentric nonocclusive thrombus in the LEFT lower lobe pulmonary artery. Peripheral LEFT lower lobe pulmonary artery leading into dense consolidative change does not enhance to the same degree as other vessels in the chest. Significance uncertain. Relative hypoperfusion or larger nonvisualized embolism to this area is considered. Consolidative changes are more suggestive of collapse due to large pleural effusion potentially associated with underlying pneumonia rather than pulmonary infarct based on appearance though findings remain nonspecific. 3. Motion limited assessment in limited evaluation due to bolus timing with eccentric filling of RIGHT lower lobe segmental branches suggested, difficult to confirm. 4. Appearance of this area and the LEFT lung base raises the question of recent but not acute pulmonary emboli. Further assessment with venous Doppler evaluation including LEFT upper extremity (due to PICC line) may be helpful to exclude a source which might change patient management. 5. Ascites in the upper abdomen with loculated fluid in the gastrohepatic ligament and omental stranding. Constellation of findings and appearance in addition of malignancy could also be seen with peritonitis. The liver shows lobular contours. Correlate with any  laboratory evidence of peritonitis either primary or related to spontaneous bacterial peritonitis. 6. Aortic atherosclerosis. 7. LEFT-sided PICC line terminates at the midportion of the superior vena cava. Aortic Atherosclerosis (ICD10-I70.0). A call is out to the referring provider to further discuss findings in the above case. Electronically Signed: By: Zetta Bills M.D. On: 09/01/2022 14:57   DG Abd 2 Views  Result Date: 09/01/2022 CLINICAL DATA:  Ascites EXAM: ABDOMEN - 2 VIEW COMPARISON:  06/29/2023 FINDINGS: Nasogastric tube tip projects over mid stomach. Air-filled nondistended ascending through transverse colon. No bowel dilatation or bowel wall thickening. Osseous structures unremarkable. No pathologic calcifications. IMPRESSION: No acute abnormalities. Electronically Signed   By: Lavonia Dana M.D.   On: 09/01/2022 13:36   [place] EKG SITE RITE  Result Date: 09/01/2022 If [facility] image not attached, placement could not be confirmed due to current cardiac rhythm.   Scheduled Meds:  Benzocaine   Mouth/Throat Once   bisacodyl  10 mg Rectal Daily   Chlorhexidine Gluconate Cloth  6 each Topical Daily   insulin aspart  0-15 Units Subcutaneous Q4H   lip balm   Topical BID   metoCLOPramide (REGLAN) injection  10 mg Intravenous Q6H   metoprolol tartrate  5 mg Intravenous Q6H   pantoprazole (PROTONIX) IV  40 mg Intravenous Q12H   simethicone  80 mg Oral QID   sodium chloride flush  10-40 mL Intracatheter Q12H   Continuous Infusions:  sodium chloride Stopped (08/25/22 0520)   sodium chloride Stopped (09/01/22 0905)   ceFEPime (MAXIPIME) IV 2 g (09/02/22 0533)   heparin 1,800 Units/hr (09/02/22 0551)   ondansetron (ZOFRAN) IV     TPN ADULT (ION) 55 mL/hr at 09/01/22 1709   TPN ADULT (ION)       LOS: 13 days  Shelly Coss, MD [facility] P2/24/2024, 11:51 AM

## 2022-09-02 NOTE — Progress Notes (Signed)
Palliative-   Brief note- full consult to follow.   [name] is 72 yo F admitted for abdominal pain. Workup has revealed peritoneal carcinomatosis, unknown origin. Admission has been complicated by PE, pneumonia, recurrent ascites, pleural effusion. Per Oncology note- not candidate for aggressive chemotherapy. Any treatment would be Palliative in nature.  On evaluation she woke briefly to my voice and then quickly returned to sleep. She was unable to participate in goals of care discussion. Her daughter, [name] was at bedside- I introduced Palliative medicine. [name] asked that I call patient's sister for further discussion and decision making.  I called patient's sister [name].  Prior to admission [name] was living at home with her spouse and daughter. Her spouse is disabled. [name] is surrogate decision maker due to spouse's disability and daughter's request.   We discussed [name]'s current illness- new cancer diagnosis.  [name] would like to have meeting with patient and her daughter present to discuss further goals of care.  Comfort is a priority, but wishes to continue life prolonging care until our meeting. We discussed hospice philosophy and services. She may be eligible for residential hospice.  Code status was discussed. [name] would not want to be put on life support or have more aggressive interventions to prolong her life in the event that her condition worsened. She would not want CPR.  I discussed DNR order and [name] is in agreement.   Plan:  -DNR- limited interventions -Hydromorphone 0.'5mg'$  IV q2hr prn for severe pain -Plan for follow up meeting tomorrow at 1pm   No charge

## 2022-09-03 DIAGNOSIS — R197 Diarrhea, unspecified: Secondary | ICD-10-CM | POA: Diagnosis not present

## 2022-09-03 DIAGNOSIS — R112 Nausea with vomiting, unspecified: Secondary | ICD-10-CM | POA: Diagnosis not present

## 2022-09-03 DIAGNOSIS — K559 Vascular disorder of intestine, unspecified: Secondary | ICD-10-CM | POA: Diagnosis not present

## 2022-09-03 DIAGNOSIS — K5981 Ogilvie syndrome: Secondary | ICD-10-CM

## 2022-09-03 DIAGNOSIS — C786 Secondary malignant neoplasm of retroperitoneum and peritoneum: Secondary | ICD-10-CM | POA: Diagnosis not present

## 2022-09-03 LAB — BASIC METABOLIC PANEL
Anion gap: 6 (ref 5–15)
BUN: 31 mg/dL — ABNORMAL HIGH (ref 8–23)
CO2: 26 mmol/L (ref 22–32)
Calcium: 8 mg/dL — ABNORMAL LOW (ref 8.9–10.3)
Chloride: 108 mmol/L (ref 98–111)
Creatinine, Ser: 0.94 mg/dL (ref 0.44–1.00)
GFR, Estimated: >60 mL/min (ref >60)
Glucose, Bld: 158 mg/dL — ABNORMAL HIGH (ref 70–99)
Potassium: 4.2 mmol/L (ref 3.5–5.1)
Sodium: 140 mmol/L (ref 135–145)

## 2022-09-03 LAB — CBC
HCT: 35.4 % — ABNORMAL LOW (ref 36.0–46.0)
Hemoglobin: 10.8 g/dL — ABNORMAL LOW (ref 12.0–15.0)
MCH: 31.4 pg (ref 26.0–34.0)
MCHC: 30.5 g/dL (ref 30.0–36.0)
MCV: 102.9 fL — ABNORMAL HIGH (ref 80.0–100.0)
Platelets: 256 10*3/uL (ref 150–400)
RBC: 3.44 MIL/uL — ABNORMAL LOW (ref 3.87–5.11)
RDW: 13.3 % (ref 11.5–15.5)
WBC: 15.9 10*3/uL — ABNORMAL HIGH (ref 4.0–10.5)
nRBC: 0 % (ref 0.0–0.2)

## 2022-09-03 LAB — GLUCOSE, CAPILLARY
Glucose-Capillary: 157 mg/dL — ABNORMAL HIGH (ref 70–99)
Glucose-Capillary: 171 mg/dL — ABNORMAL HIGH (ref 70–99)
Glucose-Capillary: 133 mg/dL — ABNORMAL HIGH (ref 70–99)

## 2022-09-03 LAB — HEPARIN LEVEL (UNFRACTIONATED)
Heparin Unfractionated: 0.73 IU/mL — ABNORMAL HIGH (ref 0.30–0.70)
Heparin Unfractionated: 0.6 IU/mL (ref 0.30–0.70)

## 2022-09-03 LAB — MAGNESIUM: Magnesium: 2.3 mg/dL (ref 1.7–2.4)

## 2022-09-03 LAB — PHOSPHORUS: Phosphorus: 3.3 mg/dL (ref 2.5–4.6)

## 2022-09-03 MED ORDER — HALOPERIDOL LACTATE 5 MG/ML IJ SOLN: 0.5000 mg | INTRAMUSCULAR | Status: DC | PRN

## 2022-09-03 MED ORDER — HALOPERIDOL 0.5 MG PO TABS: 0.5000 mg | ORAL_TABLET | ORAL | Status: DC | PRN

## 2022-09-03 MED ORDER — POLYVINYL ALCOHOL 1.4 % OP SOLN: 1.0000 [drp] | Freq: Four times a day (QID) | OPHTHALMIC | Status: DC | PRN

## 2022-09-03 MED ORDER — SODIUM CHLORIDE 0.9 % IV SOLN
25.0000 ug/h | INTRAVENOUS | Status: DC
  Administered 2022-09-03 – 2022-09-05 (×3): 25 ug/h via INTRAVENOUS
  Filled 2022-09-03 (×5): qty 1

## 2022-09-03 MED ORDER — DEXAMETHASONE SODIUM PHOSPHATE 4 MG/ML IJ SOLN
4.0000 mg | INTRAMUSCULAR | Status: DC
  Administered 2022-09-04 – 2022-09-05 (×2): 4 mg via INTRAVENOUS
  Filled 2022-09-03 (×2): qty 1

## 2022-09-03 MED ORDER — DIAZEPAM 5 MG/ML IJ SOLN
5.0000 mg | Freq: Three times a day (TID) | INTRAMUSCULAR | Status: DC
  Administered 2022-09-04 – 2022-09-05 (×5): 5 mg via INTRAVENOUS
  Filled 2022-09-03 (×6): qty 2

## 2022-09-03 MED ORDER — GLYCOPYRROLATE 0.2 MG/ML IJ SOLN: 0.2000 mg | INTRAMUSCULAR | Status: DC | PRN

## 2022-09-03 MED ORDER — ONDANSETRON HCL 4 MG/2ML IJ SOLN
4.0000 mg | Freq: Three times a day (TID) | INTRAMUSCULAR | Status: DC
  Administered 2022-09-03 – 2022-09-05 (×7): 4 mg via INTRAVENOUS
  Filled 2022-09-03 (×7): qty 2

## 2022-09-03 MED ORDER — TRACE MINERALS CU-MN-SE-ZN 300-55-60-3000 MCG/ML IV SOLN
INTRAVENOUS | Status: DC
  Filled 2022-09-03: qty 624.8

## 2022-09-03 MED ORDER — HYDROMORPHONE HCL 1 MG/ML IJ SOLN
0.5000 mg | Freq: Four times a day (QID) | INTRAMUSCULAR | Status: DC
  Administered 2022-09-03 – 2022-09-05 (×9): 0.5 mg via INTRAVENOUS
  Filled 2022-09-03 (×8): qty 0.5

## 2022-09-03 MED ORDER — DEXAMETHASONE SODIUM PHOSPHATE 4 MG/ML IJ SOLN
4.0000 mg | Freq: Once | INTRAMUSCULAR | Status: AC
  Administered 2022-09-03: 4 mg via INTRAVENOUS
  Filled 2022-09-03: qty 1

## 2022-09-03 MED ORDER — HALOPERIDOL LACTATE 2 MG/ML PO CONC: 0.5000 mg | ORAL | Status: DC | PRN

## 2022-09-03 MED ORDER — GLYCOPYRROLATE 1 MG PO TABS: 1.0000 mg | ORAL_TABLET | ORAL | Status: DC | PRN

## 2022-09-03 NOTE — Progress Notes (Signed)
ANTICOAGULATION CONSULT NOTE - follow up  Pharmacy Consult for IV heparin Indication: pulmonary embolus  Allergies  Allergen Reactions   Nsaids Other (See Comments)    Gastric pneumatosis    Patient Measurements: Height: '5\' 5"'$  (165.1 cm) Weight: 103.9 kg (229 lb 0.9 oz) IBW/kg (Calculated) : 57 Heparin Dosing Weight: 82 kg  Vital Signs: Temp: 97.8 F (36.6 C) (02/25 0755) Temp Source: Oral (02/25 0755) BP: 136/82 (02/25 0755) Pulse Rate: 110 (02/25 0755)  Labs: Recent Labs    09/01/22 0608 09/02/22 0125 09/02/22 0125 09/02/22 1112 09/02/22 1911 09/03/22 0306  HGB  --  10.5*  --   --   --  10.8*  HCT  --  34.7*  --   --   --  35.4*  PLT  --  231  --   --   --  256  HEPARINUNFRC  --  <0.10*   < > 0.33 0.18* 0.73*  CREATININE 0.85 0.97  --   --   --  0.94   < > = values in this interval not displayed.     Estimated Creatinine Clearance: 64.7 mL/min (by C-G formula based on SCr of 0.94 mg/dL).   Medical History: Past Medical History:  Diagnosis Date   Diabetes mellitus type 2, noninsulin dependent ([place]) 08/20/2022   Generalized anxiety disorder 08/20/2022   History of panic attacks 08/20/2022   History of seizures 2013 08/20/2022   Hypertension    Hypertensive urgency in 2012 08/20/2022   Irritable bowel syndrome (IBS) 08/20/2022   Melanoma of back ([place]) 1997   Severe obesity (BMI 35.0-35.9 with comorbidity) ([facility]) 08/20/2022    Medications:  Medications Prior to Admission  Medication Sig Dispense Refill Last Dose   acetaminophen (TYLENOL) 500 MG tablet Take 1,000 mg by mouth every 6 (six) hours as needed for mild pain.   unk   ALPRAZolam (XANAX) 0.25 MG tablet Take 0.25 mg by mouth 3 (three) times daily as needed. For anxiety    unk   citalopram (CELEXA) 20 MG tablet Take 20 mg by mouth daily.     08/19/2022   dextromethorphan (DELSYM) 30 MG/5ML liquid Take 60 mg by mouth at bedtime as needed for cough. For cough   unk   dicyclomine (BENTYL) 20 MG tablet  Take 20 mg by mouth 2 (two) times daily as needed for spasms.   08/19/2022   famotidine (PEPCID AC MAXIMUM STRENGTH) 20 MG tablet Take 20 mg by mouth daily as needed for heartburn or indigestion.   08/19/2022   hydrochlorothiazide (HYDRODIURIL) 12.5 MG tablet Take 12.5 mg by mouth daily.   08/19/2022   hydroxypropyl methylcellulose (ISOPTO TEARS) 2.5 % ophthalmic solution Place 1 drop into both eyes 3 (three) times daily as needed. For dry eyes    Past Week   loratadine (CLARITIN) 10 MG tablet Take 10 mg by mouth daily.     08/19/2022   losartan (COZAAR) 100 MG tablet Take 100 mg by mouth daily.   08/19/2022   metoprolol (LOPRESSOR) 50 MG tablet Take 50 mg by mouth 2 (two) times daily.     08/19/2022 at 1100   oxymetazoline (AFRIN) 0.05 % nasal spray Place 1 spray into both nostrils daily as needed for congestion.   Past Month   pravastatin (PRAVACHOL) 10 MG tablet Take 10 mg by mouth at bedtime.   Past Month   Scheduled:   Benzocaine   Mouth/Throat Once   bisacodyl  10 mg Rectal Daily   Chlorhexidine Gluconate Cloth  6 each Topical Daily   insulin aspart  0-15 Units Subcutaneous Q4H   lip balm   Topical BID   metoCLOPramide (REGLAN) injection  10 mg Intravenous Q6H   metoprolol tartrate  5 mg Intravenous Q6H   pantoprazole (PROTONIX) IV  40 mg Intravenous Q12H   simethicone  80 mg Oral QID   sodium chloride flush  10-40 mL Intracatheter Q12H   PRN: sodium chloride, acetaminophen **OR** acetaminophen, alum & mag hydroxide-simeth, diazepam, hydrALAZINE, HYDROcodone-acetaminophen, HYDROmorphone (DILAUDID) injection, magic mouthwash, menthol-cetylpyridinium, ondansetron (ZOFRAN) IV **OR** ondansetron (ZOFRAN) IV, mouth rinse, phenol, prochlorperazine, simethicone, sodium chloride flush, sodium chloride flush  Assessment: 32 yoF with PMH DM2, CVA, HTN, epilepsy, admitted for abdominal pain. 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 On day 13 of admission, patient developed SOB and CTA was performed. Image quality was poor, but study suggested possible PE vs PNA. D-dimer returned markedly elevated and heparin was started per Pharmacy dosing.  Baseline INR, aPTT: not done Prior anticoagulation: none; SCDs only this admission  Today, 09/03/2022: - heparin level collected at 12:30p is therapeutic at 0.60 with rate infusing at 1900 units/hr  - CBC stable - no bleeding documented    Goal of Therapy: Heparin level 0.3-0.7 units/ml Monitor platelets by anticoagulation protocol: Yes  Plan: -  Continue heparin drip at 1900 units/hr - daily heparin level and cbc  - Monitor for signs of bleeding or thrombosis   Dia Sitter, PharmD, BCPS 09/03/2022 11:10 AM

## 2022-09-03 NOTE — Progress Notes (Signed)
[facility] FOLLOW UP NOTE  Patient Care Team: Mary Pepper, MD as PCP - General (Family Medicine) Mary Rolls, MD as Referring Physician (Surgical Oncology)  CHIEF COMPLAINTS/PURPOSE OF CONSULTATION:  Metastatic high-grade serous carcinoma  ASSESSMENT & PLAN:   This is a very pleasant 72 year old female patient with type 2 diabetes, hypertension, CVA, seizure disorder, history of melanoma on the back presented with worsening abdominal pain, weight loss, nausea, vomiting, diarrhea.  She had imaging which suggested peritoneal carcinomatosis, ascites, omental stranding and possible infectious or ischemic colitis.  Surgery was consulted.  IR guided omental biopsy showed high-grade serous carcinoma likely endometrial primary especially with her history of vaginal bleeding.  According to patient and her family, she has not been in a good shape for several months or longer, has been relatively staying in bed and sedentary.  She continues to have NG tube in place. General surgery on board, continue supportive care and TPN.  Given her underlying performance status and metastatic nature of the cancer, we have discussed that it may not be unreasonable to consider palliative care and focus on goals of care and comfort measures.  If she does improve clinically and can tolerate p.o. feeding, we can certainly consider treatment in the future with palliative intent.  I have previously and today mentioned the nature of the cancer which is advanced and the incurable nature of the treatment.  According to her sister and her daughter, they are leaning towards keeping her comfortable.  Patient did not speak to me during this conversation but did not appear to refuse any of the suggestions either.  I suggest palliative care discussions as planned today.  At this time, we will be available as needed since she is not a candidate for treatment at this time.  HISTORY OF PRESENTING ILLNESS:  [name] 72 y.o. female is here because of metastatic high-grade serous carcinoma  Oncology History   No history exists.   This is a pleasant 72 yr old female patient with type 2 diabetes, cardiovascular accident, hypertension, seizure disorder, history of melanoma on the back who presented with 3 weeks symptoms of abdominal pain, weight loss which she cannot quantitate, vomiting, diarrhea and decreased appetite.  She was found to have infectious or ischemic colitis on presentation, suspect peritoneal carcinomatosis, ascites, omental stranding and general surgery was consulted.  She had an IR guided omental biopsy and oncology was consulted because of possible malignancy.  Surgical pathology showed high-grade serous carcinoma immunohistochemical stains were performed positive for p16 and show complete loss for p53.  PAX8 negative.  So far primary points to be endometrium and less likely primary peritoneal or ovarian.  Upon further evaluation and discussion with family, her sister, her daughter, patient apparently had some vaginal spotting and bleeding but refused to go to the doctor.  I was asked to see her back today to comment on prognosis and options for treatment since she continues to decline.  Since her last visit with her, have had a long conversation with her sister [name] who is a medical contact and requested a call from [place].  According to Ms. Rosales, [name] has not been in her best shape for several months has been pretty much sedentary, lives with her husband who is wheelchair-bound and has a colostomy.  She has a daughter who was present at the time of my visit today.  According to her daughter as well she has not been in her best shape.  Today  when I called her name she did wake up, she tells me that she is in some discomfort in the abdomen but no other pain elsewhere.  She denied any complaints today.  She refused any vaginal bleeding however her daughter said this is an accurate.   She appeared to have been listening to the conversation with her eyes closed.   MEDICAL HISTORY:  Past Medical History:  Diagnosis Date   Diabetes mellitus type 2, noninsulin dependent ([place]) 08/20/2022   Generalized anxiety disorder 08/20/2022   History of panic attacks 08/20/2022   History of seizures 2013 08/20/2022   Hypertension    Hypertensive urgency in 2012 08/20/2022   Irritable bowel syndrome (IBS) 08/20/2022   Melanoma of back ([place]) 1997   Severe obesity (BMI 35.0-35.9 with comorbidity) ([place]) 08/20/2022    SURGICAL HISTORY: Past Surgical History:  Procedure Laterality Date   CATARACT EXTRACTION  2015    SOCIAL HISTORY: Social History   Socioeconomic History   Marital status: Married    Spouse name: Not on file   Number of children: Not on file   Years of education: Not on file   Highest education level: Not on file  Occupational History   Not on file  Tobacco Use   Smoking status: Former   Smokeless tobacco: Not on file  Vaping Use   Vaping Use: Never used  Substance and Sexual Activity   Alcohol use: Not on file   Drug use: Not on file   Sexual activity: Not on file  Other Topics Concern   Not on file  Social History Narrative   Not on file   Social Determinants of Health   Financial Resource Strain: Not on file  Food Insecurity: No Food Insecurity (08/21/2022)   Hunger Vital Sign    Worried About Running Out of Food in the Last Year: Never true    Ran Out of Food in the Last Year: Never true  Transportation Needs: No Transportation Needs (08/21/2022)   PRAPARE - Hydrologist (Medical): No    Lack of Transportation (Non-Medical): No  Physical Activity: Not on file  Stress: Not on file  Social Connections: Not on file  Intimate Partner Violence: Not At Risk (08/21/2022)   Humiliation, Afraid, Rape, and Kick questionnaire    Fear of Current or Ex-Partner: No    Emotionally Abused: No    Physically Abused: No     Sexually Abused: No    FAMILY HISTORY: History reviewed. No pertinent family history.  ALLERGIES:  is allergic to nsaids.  MEDICATIONS:  Current Facility-Administered Medications  Medication Dose Route Frequency Provider Last Rate Last Admin   0.9 %  sodium chloride infusion   Intravenous PRN Reubin Milan, MD   Stopped at 08/25/22 0520   0.9 %  sodium chloride infusion   Intravenous Continuous Lenis Noon, [facility]   Stopped at 09/01/22 [id number]   acetaminophen (TYLENOL) tablet 650 mg  650 mg Oral Q6H PRN Reubin Milan, MD       Or   acetaminophen (TYLENOL) suppository 650 mg  650 mg Rectal Q6H PRN Reubin Milan, MD       alum & mag hydroxide-simeth (MAALOX/MYLANTA) 200-200-20 MG/5ML suspension 30 mL  30 mL Oral Q6H PRN Reubin Milan, MD   30 mL at 08/25/22 2316   Benzocaine (HURRCAINE) 20 % mouth spray   Mouth/Throat Once Debbe Odea, MD       bisacodyl (DULCOLAX) suppository 10 mg  10 mg Rectal Daily Debbe Odea, MD   10 mg at 09/03/22 0959   ceFEPIme (MAXIPIME) 2 g in sodium chloride 0.9 % 100 mL IVPB  2 g Intravenous Q8H Wofford, Drew A, RPH 200 mL/hr at 09/03/22 0454 2 g at 09/03/22 0454   Chlorhexidine Gluconate Cloth 2 % PADS 6 each  6 each Topical Daily Reubin Milan, MD   6 each at 09/03/22 0948   diazepam (VALIUM) injection 5 mg  5 mg Intravenous Q4H PRN Debbe Odea, MD   5 mg at 09/01/22 2124   heparin ADULT infusion 100 units/mL (25000 units/227m)  1,900 Units/hr Intravenous Continuous JAngela Adam RPH 19 mL/hr at 09/03/22 0455 1,900 Units/hr at 09/03/22 0455   hydrALAZINE (APRESOLINE) injection 10 mg  10 mg Intravenous Q6H PRN AShelly Coss MD   10 mg at 09/02/22 [id number]  HYDROcodone-acetaminophen (NORCO/VICODIN) 5-325 MG per tablet 1 tablet  1 tablet Oral Q6H PRN RDebbe Odea MD   1 tablet at 08/29/22 [id number]  HYDROmorphone (DILAUDID) injection 0.5 mg  0.5 mg Intravenous Q2H PRN MEarlie Counts NP   0.5 mg at 09/03/22 0943   insulin  aspart (novoLOG) injection 0-15 Units  0-15 Units Subcutaneous Q4H SLenis Noon [facility]  3 Units at 09/03/22 0820   lip balm (CARMEX) ointment   Topical BID OReubin Milan MD   Given at 09/03/22 [id number]  magic mouthwash  15 mL Oral QID PRN OReubin Milan MD   15 mL at 08/25/22 0115   menthol-cetylpyridinium (CEPACOL) lozenge 3 mg  1 lozenge Oral PRN OReubin Milan MD       metoCLOPramide ([facility] injection 10 mg  10 mg Intravenous Q6H RDebbe Odea MD   10 mg at 09/03/22 0448   metoprolol tartrate (LOPRESSOR) injection 5 mg  5 mg Intravenous Q6H RDebbe Odea MD   5 mg at 09/03/22 0448   ondansetron (ZOFRAN) injection 4 mg  4 mg Intravenous Q6H PRN OReubin Milan MD   4 mg at 09/01/22 0115   Or   ondansetron (ZOFRAN) 8 mg in sodium chloride 0.9 % 50 mL IVPB  8 mg Intravenous Q6H PRN OReubin Milan MD       Oral care mouth rinse  15 mL Mouth Rinse PRN OReubin Milan MD       pantoprazole (PROTONIX) injection 40 mg  40 mg Intravenous Q12H OReubin Milan MD   40 mg at 09/03/22 0946   phenol (CHLORASEPTIC) mouth spray 1 spray  1 spray Mouth/Throat PRN OSaverio Danker PA-C       prochlorperazine (COMPAZINE) injection 5-10 mg  5-10 mg Intravenous Q4H PRN OReubin Milan MD   10 mg at 08/27/22 1440   simethicone (MYLICON) 40 [id number] 80 mg  80 mg Oral QID RDebbe Odea MD   80 mg at 09/03/22 1011   simethicone (MYLICON) 40 M99991111suspension 80 mg  80 mg Oral QID PRN RDebbe Odea MD   80 mg at 08/25/22 0917   sodium chloride flush (NS) 0.9 % injection 10-40 mL  10-40 mL Intracatheter PRN RDebbe Odea MD       sodium chloride flush (NS) 0.9 % injection 10-40 mL  10-40 mL Intracatheter Q12H Adhikari, Amrit, MD   10 mL at 09/02/22 2152   sodium chloride flush (NS) 0.9 % injection 10-40 mL  10-40 mL Intracatheter PRN AShelly Coss MD       TPN ADULT (ION)   Intravenous Continuous  TPN Lynelle Doctor, RPH 55 mL/hr at 09/03/22 0435 Infusion Verify  at 09/03/22 0435   TPN ADULT (ION)   Intravenous Continuous TPN Suzzanne Cloud, [facility]          PHYSICAL EXAMINATION:   Vitals:   09/03/22 0417 09/03/22 0755  BP: (!) 151/75 136/82  Pulse: (!) 116 (!) 110  Resp: 20 20  Temp: 98.2 F (36.8 C) 97.8 F (36.6 C)  SpO2: 96% 98%   Filed Weights   09/01/22 0500 09/02/22 0500 09/03/22 0755  Weight: 231 lb 11.3 oz (105.1 kg) 225 lb 1.6 oz (102.1 kg) 229 lb 0.9 oz (103.9 kg)    GENERAL: Lethargic but easy to awake and answers questions appropriately NG tube in place Abdomen appears distended.  LABORATORY DATA:  I have reviewed the data as listed Lab Results  Component Value Date   WBC 15.9 (H) 09/03/2022   HGB 10.8 (L) 09/03/2022   HCT 35.4 (L) 09/03/2022   MCV 102.9 (H) 09/03/2022   PLT 256 09/03/2022   '@LASTCHEM'$ @  RADIOGRAPHIC STUDIES: I have personally reviewed the radiological images as listed and agreed with the findings in the report. US THORACENTESIS ASP PLEURAL SPACE W/IMG GUIDE  Result Date: 09/02/2022 INDICATION: 72 year old female presents with shortness of breath. Previous imaging showed left PE and large left pleural effusion. Request for therapeutic and diagnostic thoracentesis. EXAM: ULTRASOUND GUIDED LEFT THORACENTESIS MEDICATIONS: 10 mL 1% lidocaine COMPLICATIONS: None immediate. PROCEDURE: An ultrasound guided thoracentesis was thoroughly discussed with the patient and questions answered. The benefits, risks, alternatives and complications were also discussed. The patient understands and wishes to proceed with the procedure. Written consent was obtained. Ultrasound was performed to localize and Rosales an adequate pocket of fluid in the left chest. The area was then prepped and draped in the normal sterile fashion. 1% Lidocaine was used for local anesthesia. Under ultrasound guidance a 19 gauge, 10-cm, Yueh catheter was introduced. Thoracentesis was performed. The catheter was removed and a dressing applied. FINDINGS: A  total of approximately 800 mL of hazy amber fluid was removed. Samples were sent to the laboratory as requested by the clinical team. Post procedure chest X-ray reviewed, negative for pneumothorax. IMPRESSION: Successful ultrasound guided left thoracentesis yielding 800 mL of pleural fluid. Read by: Durenda Guthrie, PA-C Electronically Signed   By: Jacqulynn Cadet M.D.   On: 09/02/2022 11:23   DG CHEST PORT 1 VIEW  Result Date: 09/02/2022 CLINICAL DATA:  Post thoracentesis. EXAM: PORTABLE CHEST 1 VIEW COMPARISON:  Chest x-ray dated 08/30/2022. Chest CT dated 09/01/2022. FINDINGS: Significantly improved aeration at the LEFT lung base status post thoracentesis. No pneumothorax is seen. RIGHT lung is clear. Heart size and mediastinal contours are stable. Enteric tube passes below the diaphragm. LEFT-sided PICC line in place with tip adequately positioned at the level of the mid/upper SVC. IMPRESSION: Significantly improved aeration at the LEFT lung base status post thoracentesis. No pneumothorax seen. Electronically Signed   By: Franki Cabot M.D.   On: 09/02/2022 11:01   DG Abd Portable 1V  Result Date: 09/02/2022 CLINICAL DATA:  72 year old female status post nasogastric tube placement. EXAM: PORTABLE ABDOMEN - 1 VIEW COMPARISON:  09/01/2022. FINDINGS: Nasogastric tube tip in the distal body of the stomach with side port in the proximal stomach. Moderate to large left pleural effusion with atelectasis and/or consolidation in the base of the left lung. Visualized bowel-gas pattern in the upper abdomen is unremarkable. Lower abdomen is incompletely imaged. IMPRESSION: 1. Nasogastric tube positioned in the  stomach, as above. Electronically Signed   By: Vinnie Langton M.D.   On: 09/02/2022 06:57   VAS [place] UPPER EXTREMITY VENOUS DUPLEX  Result Date: 09/01/2022 UPPER VENOUS STUDY  Patient Name:  SAM HECTOR  Date of Exam:   09/01/2022 Medical Rec #: [id number]     Accession #:    [id number] Date of Birth: April 26, 1951      Patient Gender: F Patient Age:   24 years Exam Location:  [facility] Procedure:      VAS [place] UPPER EXTREMITY VENOUS DUPLEX Referring Phys: AMRIT ADHIKARI --------------------------------------------------------------------------------  Indications: pulmonary embolism Limitations: Poor ultrasound/tissue interface, bandages and line. Comparison Study: No prior studies. Performing Technologist: Oliver Hum RVT  Examination Guidelines: A complete evaluation includes B-mode imaging, spectral Doppler, color Doppler, and power Doppler as needed of all accessible portions of each vessel. Bilateral testing is considered an integral part of a complete examination. Limited examinations for reoccurring indications may be performed as noted.  Right Findings: +----------+------------+---------+-----------+----------+-------+ RIGHT     CompressiblePhasicitySpontaneousPropertiesSummary +----------+------------+---------+-----------+----------+-------+ Subclavian    Full       Yes       Yes                      +----------+------------+---------+-----------+----------+-------+  Left Findings: +----------+------------+---------+-----------+----------+-------+ LEFT      CompressiblePhasicitySpontaneousPropertiesSummary +----------+------------+---------+-----------+----------+-------+ IJV           Full       Yes       Yes                      +----------+------------+---------+-----------+----------+-------+ Subclavian    Full       Yes       Yes                      +----------+------------+---------+-----------+----------+-------+ Axillary      Full       Yes       Yes                      +----------+------------+---------+-----------+----------+-------+ Brachial      Full       Yes       Yes                      +----------+------------+---------+-----------+----------+-------+ Radial        Full                                           +----------+------------+---------+-----------+----------+-------+ Ulnar         Full                                          +----------+------------+---------+-----------+----------+-------+ Cephalic      Full                                          +----------+------------+---------+-----------+----------+-------+ Basilic       Full                                          +----------+------------+---------+-----------+----------+-------+  Summary:  Right: No evidence of thrombosis in the subclavian.  Left: No evidence of deep vein thrombosis in the upper extremity. No evidence of superficial vein thrombosis in the upper extremity.  *See table(s) above for measurements and observations.  Diagnosing physician: Orlie Pollen Electronically signed by Orlie Pollen on 09/01/2022 at 6:57:52 PM.    Final    US Paracentesis  Result Date: 09/01/2022 INDICATION: Patient with history of chronic kidney disease, abdominal pain, omental stranding, recurrent ascites. Request received for therapeutic paracentesis. EXAM: ULTRASOUND GUIDED THERAPEUTIC PARACENTESIS MEDICATIONS: 8 ml 1% lidocaine COMPLICATIONS: None immediate. PROCEDURE: Informed written consent was obtained from the patient after a discussion of the risks, benefits and alternatives to treatment. A timeout was performed prior to the initiation of the procedure. Initial ultrasound scanning demonstrates a moderate amount of ascites within the right lower abdominal quadrant. The right lower abdomen was prepped and draped in the usual sterile fashion. 1% lidocaine was used for local anesthesia. Following this, a 19 gauge, 10-cm, Yueh catheter was introduced. An ultrasound image was saved for documentation purposes. The paracentesis was performed. The catheter was removed and a dressing was applied. The patient tolerated the procedure well without immediate post procedural complication. FINDINGS: A total of approximately 2.3 liters of amber  fluid was removed. IMPRESSION: Successful ultrasound-guided therapeutic paracentesis yielding 2.3 liters of peritoneal fluid. Read by: Rowe Robert, PA-C Electronically Signed   By: Aletta Edouard M.D.   On: 09/01/2022 16:43   CT Angio Chest Pulmonary Embolism (PE) W or WO Contrast  Addendum Date: 09/01/2022   ADDENDUM REPORT: 09/01/2022 15:04 ADDENDUM: These results were called by telephone at the time of interpretation on 09/01/2022 at 3:04 pm to provider [facility] , who verbally acknowledged these results. Electronically Signed   By: Zetta Bills M.D.   On: 09/01/2022 15:04   Result Date: 09/01/2022 CLINICAL DATA:  Pulmonary embolism, shortness of breath. EXAM: CT ANGIOGRAPHY CHEST WITH CONTRAST TECHNIQUE: Multidetector CT imaging of the chest was performed using the standard protocol during bolus administration of intravenous contrast. Multiplanar CT image reconstructions and MIPs were obtained to evaluate the vascular anatomy. RADIATION DOSE REDUCTION: This exam was performed according to the departmental dose-optimization program which includes automated exposure control, adjustment of the mA and/or kV according to patient size and/or use of iterative reconstruction technique. CONTRAST:  56m OMNIPAQUE IOHEXOL 350 MG/ML SOLN COMPARISON:  Chest radiograph of August 30, 2022 and CT of the abdomen and pelvis which was performed on August 27, 2022. FINDINGS: Cardiovascular: The aorta displays a normal three-vessel branching pattern. Aortic atherosclerosis both calcified and noncalcified without dilation. Normal heart size without pericardial effusion or nodularity. Central pulmonary vasculature is opacified to 174 Hounsfield units, study is limited by bolus timing. No central pulmonary embolism. w eccentric filling defect in LEFT lower lobe pulmonary artery on image 46/4. Potential eccentric thrombi in RIGHT lower lobe segmental branches on image 55/4 Non opacification of peripheral LEFT lower  lobe pulmonary artery leading into area dense consolidative change. In general peripheral branches of the pulmonary arterial bed are not well opacified elsewhere in the chest likely due to bolus timing factors but LEFT lower lobe is certainly more pronounced than other areas. LEFT-sided PICC line terminates at the midportion of the superior vena cava. Mediastinum/Nodes: No adenopathy in the chest. Lungs/Pleura: Dense LEFT lower lobe consolidative change with large LEFT-sided pleural effusion. No pleural effusion in the RIGHT chest. Mild interstitial thickening and ground-glass in the upper lobes. Upper Abdomen: Loculated fluid in  the gastrohepatic ligament. Signs of gastric edema. Perihepatic ascites and partially visualized stranding or nodularity in the omentum. No acute upper abdominal process otherwise to the extent evaluated. Musculoskeletal: No chest wall mass. No acute bone finding or destructive bone process. Review of the MIP images confirms the above findings. IMPRESSION: 1. Study limited by bolus timing factors. No central pulmonary embolism. 2. Potential eccentric nonocclusive thrombus in the LEFT lower lobe pulmonary artery. Peripheral LEFT lower lobe pulmonary artery leading into dense consolidative change does not enhance to the same degree as other vessels in the chest. Significance uncertain. Relative hypoperfusion or larger nonvisualized embolism to this area is considered. Consolidative changes are more suggestive of collapse due to large pleural effusion potentially associated with underlying pneumonia rather than pulmonary infarct based on appearance though findings remain nonspecific. 3. Motion limited assessment in limited evaluation due to bolus timing with eccentric filling of RIGHT lower lobe segmental branches suggested, difficult to confirm. 4. Appearance of this area and the LEFT lung base raises the question of recent but not acute pulmonary emboli. Further assessment with venous  Doppler evaluation including LEFT upper extremity (due to PICC line) may be helpful to exclude a source which might change patient management. 5. Ascites in the upper abdomen with loculated fluid in the gastrohepatic ligament and omental stranding. Constellation of findings and appearance in addition of malignancy could also be seen with peritonitis. The liver shows lobular contours. Correlate with any laboratory evidence of peritonitis either primary or related to spontaneous bacterial peritonitis. 6. Aortic atherosclerosis. 7. LEFT-sided PICC line terminates at the midportion of the superior vena cava. Aortic Atherosclerosis (ICD10-I70.0). A call is out to the referring provider to further discuss findings in the above case. Electronically Signed: By: Zetta Bills M.D. On: 09/01/2022 14:57   DG Abd 2 Views  Result Date: 09/01/2022 CLINICAL DATA:  Ascites EXAM: ABDOMEN - 2 VIEW COMPARISON:  06/29/2023 FINDINGS: Nasogastric tube tip projects over mid stomach. Air-filled nondistended ascending through transverse colon. No bowel dilatation or bowel wall thickening. Osseous structures unremarkable. No pathologic calcifications. IMPRESSION: No acute abnormalities. Electronically Signed   By: Lavonia Dana M.D.   On: 09/01/2022 13:36   [place] EKG SITE RITE  Result Date: 09/01/2022 If [facility] image not attached, placement could not be confirmed due to current cardiac rhythm.  DG CHEST PORT 1 VIEW  Result Date: 08/30/2022 CLINICAL DATA:  72 year old female with history of cough. EXAM: PORTABLE CHEST - 1 VIEW COMPARISON:  09/04/2010 FINDINGS: Apical projection. The mediastinal contours are within normal limits. No cardiomegaly. Right upper extremity PICC place with catheter tip terminating near the cavoatrial junction. Gastric decompression tube in place, terminating off the inferior aspect this image. Low lung volumes. Mild prominence of pulmonary vasculature without evidence of overt pulmonary edema. No focal  consolidation, pleural effusion, pneumothorax. No acute osseous abnormality. IMPRESSION: Low lung volumes with mild prominence of pulmonary vasculature, no evidence of overt pulmonary edema. Electronically Signed   By: Ruthann Cancer M.D.   On: 08/30/2022 13:31   CT ABDOMINAL MASS BIOPSY  Result Date: 08/30/2022 CLINICAL DATA:  Ascites and omental stranding. Atypical cells on paracentesis cytology. EXAM: CT GUIDED CORE BIOPSY OF OMENTUM ANESTHESIA/SEDATION: Intravenous Fentanyl 35mg and Versed 0.'5mg'$  were administered as conscious sedation during continuous monitoring of the patient's level of consciousness and physiological / cardiorespiratory status by the radiology RN, with a total moderate sedation time of 10 minutes. PROCEDURE: The procedure risks, benefits, and alternatives were explained to the patient. Questions  regarding the procedure were encouraged and answered. The patient understands and consents to the procedure. select axial scans through the abdomen were obtained. Omentum localized, appropriate skin entry site was determined and marked. The operative field was prepped with chlorhexidinein a sterile fashion, and a sterile drape was applied covering the operative field. A sterile gown and sterile gloves were used for the procedure. Local anesthesia was provided with 1% Lidocaine. Under CT fluoroscopic guidance, a 17 gauge trocar needle was advanced to the margin of the lesion. Once needle tip position was confirmed, coaxial 18-gauge core biopsy samples were obtained, submitted in saline to surgical pathology. The guide needle was removed. Postprocedure scans show no hemorrhage or other apparent complication. The patient tolerated the procedure well. COMPLICATIONS: None immediate FINDINGS: Omental localized. Representative core biopsy samples obtained as above. IMPRESSION: Technically successful CT-guided core biopsy, omentum. RADIATION DOSE REDUCTION: This exam was performed according to the  departmental dose-optimization program which includes automated exposure control, adjustment of the mA and/or kV according to patient size and/or use of iterative reconstruction technique. Electronically Signed   By: Lucrezia Europe M.D.   On: 08/30/2022 10:49   DG Abd Portable 1V  Result Date: 08/29/2022 CLINICAL DATA:  NGT placement EXAM: PORTABLE ABDOMEN - 1 VIEW COMPARISON:  08/26/2022 FINDINGS: Limited image of the upper abdomen demonstrates NG tube superimposed with stomach, below the diaphragm. IMPRESSION: NGT in place. Electronically Signed   By: Sammie Bench M.D.   On: 08/29/2022 12:34   [place] EKG SITE RITE  Result Date: 08/28/2022 If Site Rite image not attached, placement could not be confirmed due to current cardiac rhythm.  CT ABDOMEN PELVIS W CONTRAST  Result Date: 08/27/2022 CLINICAL DATA:  Abdominal pain, history of recent paracentesis EXAM: CT ABDOMEN AND PELVIS WITH CONTRAST TECHNIQUE: Multidetector CT imaging of the abdomen and pelvis was performed using the standard protocol following bolus administration of intravenous contrast. RADIATION DOSE REDUCTION: This exam was performed according to the departmental dose-optimization program which includes automated exposure control, adjustment of the mA and/or kV according to patient size and/or use of iterative reconstruction technique. CONTRAST:  126m OMNIPAQUE IOHEXOL 300 MG/ML  SOLN COMPARISON:  08/20/2022 FINDINGS: Lower chest: New small left pleural effusion is noted with left lower lobe consolidation. No parenchymal nodules are seen. Hepatobiliary: Previously seen portal venous air within the liver has resolved in the interval no focal mass lesion is seen. The gallbladder is well distended with multiple gallstones stable from the prior exam. No biliary ductal dilatation is seen. Pancreas: Unremarkable. No pancreatic ductal dilatation or surrounding inflammatory changes. Spleen: Normal in size without focal abnormality. Adrenals/Urinary  Tract: Adrenal glands are within normal limits. Kidneys demonstrate a normal enhancement pattern bilaterally. No renal calculi or obstructive changes are seen. Normal excretion is seen bilaterally. The bladder is decompressed by Foley catheter. Stomach/Bowel: Colon is predominately decompressed. The transverse colon demonstrates some air within in the previously seen diffuse wall thickening and pericolonic inflammatory change is reduced in the interval from the prior exam. The appendix is not well visualized. Few mildly prominent loops of small bowel are noted without definitive obstructive change. Edema within the mesentery is noted in these changes are likely reactive to the ascites identified. The stomach is well distended with fluid and contrast material. No extravasation is identified. The previously seen pneumatosis in the gastric wall has resolved as has the degree of portal and mesenteric venous gas. Small hiatal hernia is again noted. Vascular/Lymphatic: Atherosclerotic calcifications of the abdominal aorta are  noted. No sizable adenopathy is seen. Reproductive: Uterus and bilateral adnexa are unremarkable. Other: Mild to moderate ascites remains in the abdomen despite interval paracentesis yesterday. Some soft tissue changes are again noted in the omental fat anteriorly. This is similar to that seen on prior exam. Musculoskeletal: Degenerative changes of lumbar spine are noted. No acute bony abnormality is seen. Mild changes of anasarca are noted in the lateral abdominal walls. IMPRESSION: Persistent mild ascites with changes in the omental fat anteriorly again both infectious and neoplastic etiologies deserve consideration. Improved appearance of the transverse colon when compared with the prior exam. No evidence of perforation is noted. Resolution of previously seen gastric pneumatosis as well as portal and mesenteric venous gas. Cholelithiasis without complicating factors. New left pleural effusion and  left lower lobe consolidation. Electronically Signed   By: Inez Catalina M.D.   On: 08/27/2022 17:14   US Paracentesis  Result Date: 08/27/2022 INDICATION: Recurrent ascites of unknown etiology. Request for therapeutic paracentesis. EXAM: ULTRASOUND GUIDED PARACENTESIS MEDICATIONS: 1% lidocaine 20 mL COMPLICATIONS: None immediate. PROCEDURE: Informed written consent was obtained from the patient after a discussion of the risks, benefits and alternatives to treatment. A timeout was performed prior to the initiation of the procedure. Initial ultrasound scanning demonstrates a large amount of ascites within the left lateral abdomen. The left lateral abdomen was prepped and draped in the usual sterile fashion. 1% lidocaine was used for local anesthesia. Following this, a 19 gauge, 10-cm, Yueh catheter was introduced. An ultrasound image was saved for documentation purposes. The paracentesis was performed. The catheter was removed and a dressing was applied. The patient tolerated the procedure well without immediate post procedural complication. FINDINGS: A total of approximately 2.2 L of clear yellow fluid was removed. IMPRESSION: Successful ultrasound-guided paracentesis yielding 2.2 liters of peritoneal fluid. Procedure performed by: Gareth Eagle, PA-C Electronically Signed   By: Albin Felling M.D.   On: 08/27/2022 11:34   DG Abd Portable 1V  Result Date: 08/26/2022 CLINICAL DATA:  Nausea vomiting EXAM: PORTABLE ABDOMEN - 1 VIEW COMPARISON:  08/23/2022 FINDINGS: The bowel gas pattern is normal. No radio-opaque calculi or other significant radiographic abnormality are seen. IMPRESSION: Nonspecific bowel gas pattern. Electronically Signed   By: Sammie Bench M.D.   On: 08/26/2022 10:10   DG Abd Portable 1V  Result Date: 08/23/2022 CLINICAL DATA:  Abdominal pain. EXAM: PORTABLE ABDOMEN - 1 VIEW COMPARISON:  09/04/2010. FINDINGS: Gas-filled bowel loops without dilation. No free air. The right abdominal wall  is excluded from the field of view. No radiopaque calculi. Degenerative changes of the spine. IMPRESSION: Gas-filled bowel loops without dilation. Electronically Signed   By: Emmit Alexanders M.D.   On: 08/23/2022 11:01   US Paracentesis  Result Date: 08/22/2022 INDICATION: Abdominal distention. Ascites. Request for diagnostic and therapeutic paracentesis. EXAM: ULTRASOUND GUIDED LEFT LOWER QUADRANT PARACENTESIS MEDICATIONS: 1% plain lidocaine, 10 mL COMPLICATIONS: None immediate. PROCEDURE: Informed written consent was obtained from the patient after a discussion of the risks, benefits and alternatives to treatment. A timeout was performed prior to the initiation of the procedure. Initial ultrasound scanning demonstrates a large amount of ascites within the left lower abdominal quadrant. The left lower abdomen was prepped and draped in the usual sterile fashion. 1% lidocaine was used for local anesthesia. Following this, a 19 gauge, 10-cm, Yueh catheter was introduced. An ultrasound image was saved for documentation purposes. The paracentesis was performed. The catheter was removed and a dressing was applied. The patient tolerated the procedure  well without immediate post procedural complication. FINDINGS: A total of approximately 5.5 L of clear yellow fluid was removed. Samples were sent to the laboratory as requested by the clinical team. IMPRESSION: Successful ultrasound-guided paracentesis yielding 5.5 liters of peritoneal fluid. Read by: Ascencion Dike PA-C Electronically Signed   By: Corrie Mckusick D.O.   On: 08/22/2022 12:27   CT ABDOMEN PELVIS WO CONTRAST  Result Date: 08/20/2022 CLINICAL DATA:  Right lower quadrant pain.  Sepsis. EXAM: CT ABDOMEN AND PELVIS WITHOUT CONTRAST TECHNIQUE: Multidetector CT imaging of the abdomen and pelvis was performed following the standard protocol without IV contrast. RADIATION DOSE REDUCTION: This exam was performed according to the departmental dose-optimization  program which includes automated exposure control, adjustment of the mA and/or kV according to patient size and/or use of iterative reconstruction technique. COMPARISON:  08/15/2010 FINDINGS: Lower chest: No acute findings. Hepatobiliary: No mass visualized on this unenhanced exam. Intrahepatic portal venous gas is seen. Gallstones are seen, however there is no evidence of cholecystitis or biliary dilatation. Pancreas: No mass or inflammatory process visualized on this unenhanced exam. Spleen:  Within normal limits in size. Adrenals/Urinary tract: No evidence of urolithiasis or hydronephrosis. Unremarkable unopacified urinary bladder. Stomach/Bowel: Small hiatal hernia is seen. No evidence of bowel obstruction. Mild pneumatosis is seen in the gastric wall, however there is no evidence of gastric wall thickening. Small amount of venous gas is seen within the mesentery. No evidence of free intraperitoneal air or abscess. Moderate wall thickening is seen involving the transverse colon, with soft tissue stranding seen throughout the omental fat. Moderate ascites is noted. Vascular/Lymphatic: No pathologically enlarged lymph nodes identified. No evidence of abdominal aortic aneurysm. Aortic atherosclerotic calcification incidentally noted. Reproductive: Mildly enlarged uterus is stable with small fibroids better visualized on previous contrast enhanced exam. Other:  None. Musculoskeletal:  No suspicious bone lesions identified. IMPRESSION: Portal venous and mesenteric venous gas. Mild pneumatosis in the gastric wall, without evidence of gastric wall thickening. Emphysematous gastritis cannot be excluded. Moderate wall thickening involving the transverse colon. Differential diagnosis includes infectious or ischemic colitis, or less likely neoplasm. Moderate ascites and diffuse omental soft tissue stranding. Differential diagnosis includes infectious etiologies and peritoneal carcinomatosis. Cholelithiasis. No radiographic  evidence of cholecystitis. Electronically Signed   By: Marlaine Hind M.D.   On: 08/20/2022 12:38    All questions were answered. The patient knows to call the clinic with any problems, questions or concerns. I spent 35 minutes in the care of this patient, had a lengthy discussion with family as well including daughter and her sister.     Benay Pike, MD 09/03/2022 11:06 AM

## 2022-09-03 NOTE — Progress Notes (Signed)
ANTICOAGULATION CONSULT NOTE - follow up  Pharmacy Consult for IV heparin Indication: pulmonary embolus  Allergies  Allergen Reactions   Nsaids Other (See Comments)    Gastric pneumatosis    Patient Measurements: Height: '5\' 5"'$  (165.1 cm) Weight: 102.1 kg (225 lb 1.6 oz) IBW/kg (Calculated) : 57 Heparin Dosing Weight: 82 kg  Vital Signs: Temp: 98.2 F (36.8 C) (02/25 0417) Temp Source: Oral (02/25 0417) BP: 151/75 (02/25 0417) Pulse Rate: 116 (02/25 0417)  Labs: Recent Labs    08/31/22 0448 08/31/22 0448 09/01/22 [id number] 09/02/22 0125 09/02/22 1112 09/02/22 1911 09/03/22 0306  HGB 10.5*  --   --  10.5*  --   --  10.8*  HCT 34.3*  --   --  34.7*  --   --  35.4*  PLT 210  --   --  231  --   --  256  HEPARINUNFRC  --    < >  --  <0.10* 0.33 0.18* 0.73*  CREATININE 0.95  --  0.85 0.97  --   --   --    < > = values in this interval not displayed.     Estimated Creatinine Clearance: 62.1 mL/min (by C-G formula based on SCr of 0.97 mg/dL).   Medical History: Past Medical History:  Diagnosis Date   Diabetes mellitus type 2, noninsulin dependent ([place]) 08/20/2022   Generalized anxiety disorder 08/20/2022   History of panic attacks 08/20/2022   History of seizures 2013 08/20/2022   Hypertension    Hypertensive urgency in 2012 08/20/2022   Irritable bowel syndrome (IBS) 08/20/2022   Melanoma of back ([place]) 1997   Severe obesity (BMI 35.0-35.9 with comorbidity) ([place]) 08/20/2022    Medications:  Medications Prior to Admission  Medication Sig Dispense Refill Last Dose   acetaminophen (TYLENOL) 500 MG tablet Take 1,000 mg by mouth every 6 (six) hours as needed for mild pain.   unk   ALPRAZolam (XANAX) 0.25 MG tablet Take 0.25 mg by mouth 3 (three) times daily as needed. For anxiety    unk   citalopram (CELEXA) 20 MG tablet Take 20 mg by mouth daily.     08/19/2022   dextromethorphan (DELSYM) 30 MG/5ML liquid Take 60 mg by mouth at bedtime as needed for cough. For cough    unk   dicyclomine (BENTYL) 20 MG tablet Take 20 mg by mouth 2 (two) times daily as needed for spasms.   08/19/2022   famotidine (PEPCID AC MAXIMUM STRENGTH) 20 MG tablet Take 20 mg by mouth daily as needed for heartburn or indigestion.   08/19/2022   hydrochlorothiazide (HYDRODIURIL) 12.5 MG tablet Take 12.5 mg by mouth daily.   08/19/2022   hydroxypropyl methylcellulose (ISOPTO TEARS) 2.5 % ophthalmic solution Place 1 drop into both eyes 3 (three) times daily as needed. For dry eyes    Past Week   loratadine (CLARITIN) 10 MG tablet Take 10 mg by mouth daily.     08/19/2022   losartan (COZAAR) 100 MG tablet Take 100 mg by mouth daily.   08/19/2022   metoprolol (LOPRESSOR) 50 MG tablet Take 50 mg by mouth 2 (two) times daily.     08/19/2022 at 1100   oxymetazoline (AFRIN) 0.05 % nasal spray Place 1 spray into both nostrils daily as needed for congestion.   Past Month   pravastatin (PRAVACHOL) 10 MG tablet Take 10 mg by mouth at bedtime.   Past Month   Scheduled:   Benzocaine   Mouth/Throat Once  bisacodyl  10 mg Rectal Daily   Chlorhexidine Gluconate Cloth  6 each Topical Daily   insulin aspart  0-15 Units Subcutaneous Q4H   lip balm   Topical BID   metoCLOPramide (REGLAN) injection  10 mg Intravenous Q6H   metoprolol tartrate  5 mg Intravenous Q6H   pantoprazole (PROTONIX) IV  40 mg Intravenous Q12H   simethicone  80 mg Oral QID   sodium chloride flush  10-40 mL Intracatheter Q12H   PRN: sodium chloride, acetaminophen **OR** acetaminophen, alum & mag hydroxide-simeth, diazepam, hydrALAZINE, HYDROcodone-acetaminophen, HYDROmorphone (DILAUDID) injection, magic mouthwash, menthol-cetylpyridinium, ondansetron (ZOFRAN) IV **OR** ondansetron (ZOFRAN) IV, mouth rinse, phenol, prochlorperazine, simethicone, sodium chloride flush, sodium chloride flush  Assessment: 69 yoF with PMH DM2, CVA, HTN, epilepsy, admitted for abdominal pain. Found to have suspected peritoneal vs gynecological malignancy with  ascites, pneumobilia, and possible peritoneal carcinomatosis. On day 13 of admission, patient developed SOB and CTA was performed. Image quality was poor, but study suggested possible PE vs PNA. D-dimer returned markedly elevated and heparin was started per Pharmacy dosing.  Baseline INR, aPTT: not done Prior anticoagulation: none; SCDs only this admission  Today, 09/03/2022: - HL 0.73 supra-therapeutic on 2000 units/hr - hgb low but stable, plts WNL - per RN no bleeding  Goal of Therapy: Heparin level 0.3-0.7 units/ml Monitor platelets by anticoagulation protocol: Yes  Plan: - decrease heparin drip to 1900 units/hr - Re-check heparin level ~ 6 hours after rate change - Monitor for signs of bleeding or thrombosis  Dolly Rias RPh 09/03/2022, 4:23 AM

## 2022-09-03 NOTE — Progress Notes (Signed)
Daily Progress Note   Patient Name: [name]       Date: 09/03/2022 DOB: [birth year]  Age: 72 y.o. MRN#: [id number] Attending Physician: Mary Coss, MD Primary Care Physician: Mary Pepper, MD Admit Date: 08/20/2022  Reason for Consultation/Follow-up: Presented with severe abdominal pain, nausea, vomiting.  Has severe distended abdomen, no bowel movement.  On NG tube, TPN.  Omental biopsy confirmed high-grade serous carcinoma.  Oncology recommended palliative care consult.   Patient Profile/HPI:  72 y.o. female  with past medical history of IBS, HTN, M2, CVA, cholelithiasis admitted on 08/20/2022 with abdominal pain, nausea, vomiting. 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 She has had ongoing illeus. NG tube in place. Per Oncology- any chemo would be palliative in nature- patient not in current shape to receive chemo.        Subjective: Chart reviewed including labs, progress notes, imaging from this and previous encounters.  Met at the bedside with patient, her sister, [name]; her daughter [name], her husband [name] on speakerphone, and her mother's ex husband.  [name] was awake, but intermittently confused. She didn't recall previous conversations with medical providers. She did not remember her cancer diagnosis or discussion with Oncologist earlier today.  She was able to tell me that she would want her sister [name] to be her surrogate decision maker- her daughter and husband are in agreement with [name] being the primary decision maker and point of contact.  She has ongoing colicky pain in her abdomen with some  nausea. She has significant anxiety and became very anxious during our discussion. We discussed [name]'s diagnosis of stage IV cancer and the fact that she is not a candidate for treatment.  Reviewed continued aggressive interventions vs transition to full comfort measures only and Hospice. Discussed transition to comfort measures only which includes stopping IV fluids, antibiotics, labs and providing symptom management for SOB, anxiety, nausea, vomiting, and other symptoms of dying.  Hospice philosophy and services were reviewed.   Physical Exam Vitals and nursing note reviewed.  Skin:    Coloration: Skin is pale.  Neurological:     Mental Status: She is alert. She is disoriented.  Vital Signs: BP 136/82 (BP Location: Right Arm)   Pulse (!) 110   Temp 97.8 F (36.6 C) (Oral)   Resp 20   Ht '5\' 5"'$  (1.651 m)   Wt 103.9 kg   SpO2 98%   BMI 38.12 kg/m  SpO2: SpO2: 98 % O2 Device: O2 Device: Nasal Cannula O2 Flow Rate: O2 Flow Rate (L/min): 2 L/min  Intake/output summary:  Intake/Output Summary (Last 24 hours) at 09/03/2022 1404 Last data filed at 09/03/2022 1019 Gross per 24 hour  Intake 1271.44 ml  Output 1060 ml  Net 211.44 ml   LBM: Last BM Date : 09/03/22 Baseline Weight: Weight: 99.8 kg Most recent weight: Weight: 103.9 kg       Palliative Assessment/Data: PPS:  10%      Patient Active Problem List   Diagnosis Date Noted   Hypertension 08/20/2022   AKI (acute kidney injury) ([place]) 08/20/2022   Morbid obesity ([place]) 08/20/2022   Hyperglycemia 08/20/2022   Ascites 08/20/2022   Pneumobilia 08/20/2022   Diabetes mellitus type 2, noninsulin dependent ([place]) 08/20/2022   Generalized anxiety disorder 08/20/2022   History of completed stroke 2013 08/20/2022   History of seizures 2013 08/20/2022   Cholelithiasis 08/20/2022   History of panic attacks 08/20/2022   Nausea, vomiting, and diarrhea 08/20/2022   Irritable bowel syndrome (IBS) 08/20/2022   Slow  rate of speech - history of CVA 2012 08/20/2022   Abdominal pain, diffuse 08/20/2022    Palliative Care Assessment & Plan    Assessment/Recommendations/Plan  DNR- full comfort measures only Symptom management for acute abdomen/illeus likely 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   Thank you for allowing the Palliative Medicine Team to assist in the care of this patient.  Total time:  120 minutes  Greater than 50%  of this time was spent counseling and coordinating care related to the above assessment and plan.  KMariana Rosales AGNP-C Palliative Medicine   Please contact Palliative Medicine Team phone at [phone] questions and concerns.

## 2022-09-03 NOTE — Progress Notes (Signed)
PROGRESS NOTE  Anevaeh Rosales  [id number] DOB: 1951-05-15 DOA: 08/20/2022 PCP: London Pepper, MD   Brief Narrative:  Patient is a 72 year old female with history of obesity, diabetes type 2, anxiety, depression, CVA, hypertension, seizure disorder who presented with worsening abdominal pain for 3 weeks, decreased appetite, unintentional weight loss, diarrhea, vomiting.  On presentation, she had leukocytosis and elevated lactate of 2.2. CT abdomen/pelvis showed possible infectious or ischemic colitis indicated by colonic wall thickening, suspected peritoneal carcinomatosis due to presence of ascites, omental stranding, mesenteric venous gas and pneumatosis.  General surgery consulted.    IR did omental biopsy, elevated CA125 for suspected malignancy.  Biopsy showed high-grade serous carcinoma.  Hospital course remarkable for persistent acute hypoxic respiratory failure, CT angiogram showed left-sided PE, left-sided consolidation, left-sided pleural effusion.  Remains on TPN, NG tube.  Abdomen remains distended, no bowel movement for last several days.  Continues to complain of abdominal pain.  Palliative care consulted as per oncology recommendation for goals of care.  CODE STATUS changed to DNR.  Family again meeting with palliative care today, most likely the conclusion will be comfort care/hospice  Assessment & Plan:  Principal Problem:   Nausea, vomiting, and diarrhea Active Problems:   Hypertension   AKI (acute kidney injury) ([place])   Morbid obesity ([place])   Hyperglycemia   Ascites   Pneumobilia   Diabetes mellitus type 2, noninsulin dependent ([place])   Generalized anxiety disorder   Cholelithiasis   Slow rate of speech - history of CVA 2012   Abdominal pain, diffuse  Abdominal  pain/pneumobilia/gastric pneumatosis/high-grade serous carcinoma: Presented with nausea, vomiting, diarrhea, abdominal pain, weight loss.  Found to have ascites, underwent paracentesis x3.  Imaging showed possible  colitis/peritoneal carcinomatosis, omental stranding.  .  Cytology showed atypical cells.  CA 19-9, CEA normal but CEA-125 elevated.  IR did omental biopsy on 2/21, biopsy report showed high-grade serous carcinoma Currently n.p.o., NG tube in place. General surgery closely following.  Continue pain management, supportive care.   Continue on TPN. Abdomen remains distended.No bowel sounds GI was also following earlier, now signed off. Oncology consulted and following.  Recommended palliative care consultation given her poor prognosis.  Acute hypoxic respiratory failure: Currently requiring 2 to 4 L of oxygen per minute.  ABG done on 2/20 shows normal pCO2, normal O2.  Chest x-ray done on 2/21 showed  low lung volumes with mild prominence of pulmonary vasculature .  Patient had crackles, elevated BNP.  Given a dose of Lasix 40 mg once with significant output. Continued to complain of shortness of breath and she remains in mild sinus tachycardia.  CT angiogram done on 2/23 showed left-sided nonocclusive PE, left-sided consolidation, effusion Status post US guided thoracentesis with 800 mL fluid removal on 2/24.  Also started on broad spectrum antibiotics fro covering pneumonia.  Currently on cefepime  Macrocytosis/vitamin B12 deficiency: Low vitamin B12.  Given a dose of IM vitamin B12 injection.  Continue oral supplementation on dc when able to take by mouth  AKI on CKD stage IIIa: Currently kidney function at baseline.  Baseline creatinine around 1.1.  Losartan, hydrochlorothiazide on hold  Hypertension/sinus tachycardia: Remains hypertensive,tachycardiac.  On IV metoprolol.  Takes metoprolol, losartan, hydrochlorothiazide at home.Added prn hydralazine  Elevated liver enzymes/hyperbilirubinemia: Suspected to be from hypoperfusion.  Currently improved  Hypophosphatemia: Being monitored and supplemented as needed  Diabetes type 2: Currently sugars are stable.  Continue sliding scale and  monitor  Anxiety: On as needed Xanax  History of prior CVA  Morbid obesity: BMI 39  Goals of care: Has miserable condition with distended abdomen, no bowel movement, and NG tube.  Persistent abdominal pain.  Case discussed with oncology, consulted palliative care for goals of care.  Poor prognosis.  Dr. Chryl Heck planning discussed with the sister.  Palliative care meeting with family again today    Nutrition Problem: Inadequate oral intake Etiology: inability to eat    DVT prophylaxis:SCDs Start: 08/20/22 1402     Code Status: DNR  Family Communication: Called and discussed with sister on phone on 2/23.  Discussed with daughter at bedside on 2/25  Patient status:Inpatient  Patient is from :Home  Anticipated discharge to:not sure  Estimated DC date:not sure, still on NG tube, TPN.  palliaitve care consulted  Consultants: Surgery and IR   Procedures: Paracentesis, omental biopsy  Antimicrobials:  Anti-infectives (From admission, onward)    Start     Dose/Rate Route Frequency Ordered Stop   09/02/22 1800  vancomycin (VANCOREADY) IVPB 1250 mg/250 mL  Status:  Discontinued        1,250 mg 166.7 mL/hr over 90 Minutes Intravenous Daily-1800 09/01/22 1932 09/02/22 0925   09/01/22 2200  vancomycin (VANCOCIN) IVPB 1000 mg/200 mL premix        1,000 mg 200 mL/hr over 60 Minutes Intravenous  Once 09/01/22 1932 09/01/22 2228   09/01/22 1800  ceFEPIme (MAXIPIME) 2 g in sodium chloride 0.9 % 100 mL IVPB        2 g 200 mL/hr over 30 Minutes Intravenous Every 8 hours 09/01/22 1739     09/01/22 1800  vancomycin (VANCOCIN) IVPB 1000 mg/200 mL premix        1,000 mg 200 mL/hr over 60 Minutes Intravenous  Once 09/01/22 1747 09/01/22 1939   08/20/22 2000  piperacillin-tazobactam (ZOSYN) IVPB 3.375 g        3.375 g 12.5 mL/hr over 240 Minutes Intravenous Every 8 hours 08/20/22 1325 08/25/22 1918   08/20/22 1130  piperacillin-tazobactam (ZOSYN) IVPB 3.375 g        3.375 g 100 mL/hr over  30 Minutes Intravenous  Once 08/20/22 1126 08/20/22 1223       Subjective: Patient seen and examined bedside today.  Lying in bed, sleepy/drowsy.  Daughter at bedside.  Abdomen remains distended.NG tube in place  Objective: Vitals:   09/02/22 2009 09/03/22 0001 09/03/22 0417 09/03/22 0755  BP: (!) 147/88 (!) 158/126 (!) 151/75 136/82  Pulse: (!) 124 (!) 103 (!) 116 (!) 110  Resp: '20 20 20 20  '$ Temp: 97.6 F (36.4 C)  98.2 F (36.8 C) 97.8 F (36.6 C)  TempSrc: Oral  Oral Oral  SpO2: 95% 95% 96% 98%  Weight:    103.9 kg  Height:        Intake/Output Summary (Last 24 hours) at 09/03/2022 1049 Last data filed at 09/03/2022 1019 Gross per 24 hour  Intake 1271.44 ml  Output 1060 ml  Net 211.44 ml   Filed Weights   09/01/22 0500 09/02/22 0500 09/03/22 0755  Weight: 105.1 kg 102.1 kg 103.9 kg    Examination:   General exam: Drowsy, sleepy, obese HEENT: PERRL, NG-tube Respiratory system: Diminished sounds bilaterally, no wheezes or crackles  Cardiovascular system: Sinus tach Gastrointestinal system: Abdomen is distended, no bowel sounds heard Central nervous system:Not oriented Extremities: No edema, no clubbing ,no cyanosis,picc line on left arm Skin: No rashes, no ulcers,no icterus     Data Reviewed: I have personally reviewed following labs and imaging studies  CBC: Recent  Labs  Lab 08/28/22 0316 08/30/22 1051 08/31/22 0448 09/02/22 0125 09/03/22 0306  WBC 13.7* 12.5* 12.1* 18.3* 15.9*  HGB 11.3* 10.4* 10.5* 10.5* 10.8*  HCT 36.0 33.8* 34.3* 34.7* 35.4*  MCV 100.6* 101.2* 102.7* 103.3* 102.9*  PLT 255 274 210 231 [id number]   Basic Metabolic Panel: Recent Labs  Lab 08/30/22 0141 08/31/22 0448 09/01/22 0608 09/02/22 0125 09/03/22 0306  NA 141 146* 147* 145 140  K 3.7 3.8 4.2 4.0 4.2  CL 108 106 108 107 108  CO2 '28 31 28 30 26  '$ GLUCOSE 140* 139* 133* 148* 158*  BUN '19 17 11 '$ 24* 31*  CREATININE 0.82 0.95 0.85 0.97 0.94  CALCIUM 8.3* 8.9 9.0 8.3* 8.0*  MG  2.2 2.2 1.8 2.3 2.3  PHOS 2.6 2.8 2.3* 3.0 3.3     Recent Results (from the past 240 hour(s))  MRSA Next Gen by PCR, Nasal     Status: None   Collection Time: 09/01/22  3:46 PM   Specimen: Nasal Mucosa; Nasal Swab  Result Value Ref Range Status   MRSA by PCR Next Gen NOT DETECTED NOT DETECTED Final    Comment: (NOTE) The GeneXpert MRSA Assay (FDA approved for NASAL specimens only), is one component of a comprehensive MRSA colonization surveillance program. It is not intended to diagnose MRSA infection nor to guide or monitor treatment for MRSA infections. Test performance is not FDA approved in patients less than 107 years old. Performed at [facility], [place] [address], [place], [place] [address]   Body fluid culture w Gram Stain     Status: None (Preliminary result)   Collection Time: 09/02/22 11:06 AM   Specimen: PATH Cytology Pleural fluid  Result Value Ref Range Status   Specimen Description   Final    PLEURAL LEFT SIDE Performed at [facility] [address], [place], [place] [address]    Special Requests   Final    NONE Performed at [facility], [place] [address] [place], [place] [address]    Gram Stain   Final    WBC PRESENT, PREDOMINANTLY MONONUCLEAR NO ORGANISMS SEEN    Culture   Final    NO GROWTH < 24 HOURS Performed at [place] [address], [place], [place] [address]    Report Status PENDING  Incomplete     Radiology Studies: US THORACENTESIS ASP PLEURAL SPACE W/IMG GUIDE  Result Date: 09/02/2022 INDICATION: 72 year old female presents with shortness of breath. Previous imaging showed left PE and large left pleural effusion. Request for therapeutic and diagnostic thoracentesis. EXAM: ULTRASOUND GUIDED LEFT THORACENTESIS MEDICATIONS: 10 mL 1% lidocaine COMPLICATIONS: None immediate. PROCEDURE: An ultrasound guided thoracentesis was thoroughly discussed with the patient and questions  answered. The benefits, risks, alternatives and complications were also discussed. The patient understands and wishes to proceed with the procedure. Written consent was obtained. Ultrasound was performed to localize and mark an adequate pocket of fluid in the left chest. The area was then prepped and draped in the normal sterile fashion. 1% Lidocaine was used for local anesthesia. Under ultrasound guidance a 19 gauge, 10-cm, Yueh catheter was introduced. Thoracentesis was performed. The catheter was removed and a dressing applied. FINDINGS: A total of approximately 800 mL of hazy amber fluid was removed. Samples were sent to the laboratory as requested by the clinical team. Post procedure chest X-ray reviewed, negative for pneumothorax. IMPRESSION: Successful ultrasound guided left thoracentesis yielding 800 mL of pleural fluid. Read by:  Durenda Guthrie, PA-C Electronically Signed   By: Jacqulynn Cadet M.D.   On: 09/02/2022 11:23   DG CHEST PORT 1 VIEW  Result Date: 09/02/2022 CLINICAL DATA:  Post thoracentesis. EXAM: PORTABLE CHEST 1 VIEW COMPARISON:  Chest x-ray dated 08/30/2022. Chest CT dated 09/01/2022. FINDINGS: Significantly improved aeration at the LEFT lung base status post thoracentesis. No pneumothorax is seen. RIGHT lung is clear. Heart size and mediastinal contours are stable. Enteric tube passes below the diaphragm. LEFT-sided PICC line in place with tip adequately positioned at the level of the mid/upper SVC. IMPRESSION: Significantly improved aeration at the LEFT lung base status post thoracentesis. No pneumothorax seen. Electronically Signed   By: Franki Cabot M.D.   On: 09/02/2022 11:01   DG Abd Portable 1V  Result Date: 09/02/2022 CLINICAL DATA:  72 year old female status post nasogastric tube placement. EXAM: PORTABLE ABDOMEN - 1 VIEW COMPARISON:  09/01/2022. FINDINGS: Nasogastric tube tip in the distal body of the stomach with side port in the proximal stomach. Moderate to large left  pleural effusion with atelectasis and/or consolidation in the base of the left lung. Visualized bowel-gas pattern in the upper abdomen is unremarkable. Lower abdomen is incompletely imaged. IMPRESSION: 1. Nasogastric tube positioned in the stomach, as above. Electronically Signed   By: Vinnie Langton M.D.   On: 09/02/2022 06:57   VAS [place] UPPER EXTREMITY VENOUS DUPLEX  Result Date: 09/01/2022 UPPER VENOUS STUDY  Patient Name:  FRANZISKA STRENGTH  Date of Exam:   09/01/2022 Medical Rec #: [id number]     Accession #:    [id number] Date of Birth: 04-21-1951     Patient Gender: F Patient Age:   10 years Exam Location:  [facility] Procedure:      VAS [place] UPPER EXTREMITY VENOUS DUPLEX Referring Phys: Bailynn Dyk --------------------------------------------------------------------------------  Indications: pulmonary embolism Limitations: Poor ultrasound/tissue interface, bandages and line. Comparison Study: No prior studies. Performing Technologist: Oliver Hum RVT  Examination Guidelines: A complete evaluation includes B-mode imaging, spectral Doppler, color Doppler, and power Doppler as needed of all accessible portions of each vessel. Bilateral testing is considered an integral part of a complete examination. Limited examinations for reoccurring indications may be performed as noted.  Right Findings: +----------+------------+---------+-----------+----------+-------+ RIGHT     CompressiblePhasicitySpontaneousPropertiesSummary +----------+------------+---------+-----------+----------+-------+ Subclavian    Full       Yes       Yes                      +----------+------------+---------+-----------+----------+-------+  Left Findings: +----------+------------+---------+-----------+----------+-------+ LEFT      CompressiblePhasicitySpontaneousPropertiesSummary +----------+------------+---------+-----------+----------+-------+ IJV           Full       Yes       Yes                       +----------+------------+---------+-----------+----------+-------+ Subclavian    Full       Yes       Yes                      +----------+------------+---------+-----------+----------+-------+ Axillary      Full       Yes       Yes                      +----------+------------+---------+-----------+----------+-------+ Brachial      Full       Yes       Yes                      +----------+------------+---------+-----------+----------+-------+  Radial        Full                                          +----------+------------+---------+-----------+----------+-------+ Ulnar         Full                                          +----------+------------+---------+-----------+----------+-------+ Cephalic      Full                                          +----------+------------+---------+-----------+----------+-------+ Basilic       Full                                          +----------+------------+---------+-----------+----------+-------+  Summary:  Right: No evidence of thrombosis in the subclavian.  Left: No evidence of deep vein thrombosis in the upper extremity. No evidence of superficial vein thrombosis in the upper extremity.  *See table(s) above for measurements and observations.  Diagnosing physician: Orlie Pollen Electronically signed by Orlie Pollen on 09/01/2022 at 6:57:52 PM.    Final    US Paracentesis  Result Date: 09/01/2022 INDICATION: Patient with history of chronic kidney disease, abdominal pain, omental stranding, recurrent ascites. Request received for therapeutic paracentesis. EXAM: ULTRASOUND GUIDED THERAPEUTIC PARACENTESIS MEDICATIONS: 8 ml 1% lidocaine COMPLICATIONS: None immediate. PROCEDURE: Informed written consent was obtained from the patient after a discussion of the risks, benefits and alternatives to treatment. A timeout was performed prior to the initiation of the procedure. Initial ultrasound scanning demonstrates a moderate  amount of ascites within the right lower abdominal quadrant. The right lower abdomen was prepped and draped in the usual sterile fashion. 1% lidocaine was used for local anesthesia. Following this, a 19 gauge, 10-cm, Yueh catheter was introduced. An ultrasound image was saved for documentation purposes. The paracentesis was performed. The catheter was removed and a dressing was applied. The patient tolerated the procedure well without immediate post procedural complication. FINDINGS: A total of approximately 2.3 liters of amber fluid was removed. IMPRESSION: Successful ultrasound-guided therapeutic paracentesis yielding 2.3 liters of peritoneal fluid. Read by: Rowe Robert, PA-C Electronically Signed   By: Aletta Edouard M.D.   On: 09/01/2022 16:43   CT Angio Chest Pulmonary Embolism (PE) W or WO Contrast  Addendum Date: 09/01/2022   ADDENDUM REPORT: 09/01/2022 15:04 ADDENDUM: These results were called by telephone at the time of interpretation on 09/01/2022 at 3:04 pm to provider Dhyan Noah [facility] , who verbally acknowledged these results. Electronically Signed   By: Zetta Bills M.D.   On: 09/01/2022 15:04   Result Date: 09/01/2022 CLINICAL DATA:  Pulmonary embolism, shortness of breath. EXAM: CT ANGIOGRAPHY CHEST WITH CONTRAST TECHNIQUE: Multidetector CT imaging of the chest was performed using the standard protocol during bolus administration of intravenous contrast. Multiplanar CT image reconstructions and MIPs were obtained to evaluate the vascular anatomy. RADIATION DOSE REDUCTION: This exam was performed according to the departmental dose-optimization program which includes automated exposure control, adjustment of the mA and/or kV according to patient size and/or use of iterative  reconstruction technique. CONTRAST:  32m OMNIPAQUE IOHEXOL 350 MG/ML SOLN COMPARISON:  Chest radiograph of August 30, 2022 and CT of the abdomen and pelvis which was performed on August 27, 2022. FINDINGS: Cardiovascular:  The aorta displays a normal three-vessel branching pattern. Aortic atherosclerosis both calcified and noncalcified without dilation. Normal heart size without pericardial effusion or nodularity. Central pulmonary vasculature is opacified to 174 Hounsfield units, study is limited by bolus timing. No central pulmonary embolism. w eccentric filling defect in LEFT lower lobe pulmonary artery on image 46/4. Potential eccentric thrombi in RIGHT lower lobe segmental branches on image 55/4 Non opacification of peripheral LEFT lower lobe pulmonary artery leading into area dense consolidative change. In general peripheral branches of the pulmonary arterial bed are not well opacified elsewhere in the chest likely due to bolus timing factors but LEFT lower lobe is certainly more pronounced than other areas. LEFT-sided PICC line terminates at the midportion of the superior vena cava. Mediastinum/Nodes: No adenopathy in the chest. Lungs/Pleura: Dense LEFT lower lobe consolidative change with large LEFT-sided pleural effusion. No pleural effusion in the RIGHT chest. Mild interstitial thickening and ground-glass in the upper lobes. Upper Abdomen: Loculated fluid in the gastrohepatic ligament. Signs of gastric edema. Perihepatic ascites and partially visualized stranding or nodularity in the omentum. No acute upper abdominal process otherwise to the extent evaluated. Musculoskeletal: No chest wall mass. No acute bone finding or destructive bone process. Review of the MIP images confirms the above findings. IMPRESSION: 1. Study limited by bolus timing factors. No central pulmonary embolism. 2. Potential eccentric nonocclusive thrombus in the LEFT lower lobe pulmonary artery. Peripheral LEFT lower lobe pulmonary artery leading into dense consolidative change does not enhance to the same degree as other vessels in the chest. Significance uncertain. Relative hypoperfusion or larger nonvisualized embolism to this area is considered.  Consolidative changes are more suggestive of collapse due to large pleural effusion potentially associated with underlying pneumonia rather than pulmonary infarct based on appearance though findings remain nonspecific. 3. Motion limited assessment in limited evaluation due to bolus timing with eccentric filling of RIGHT lower lobe segmental branches suggested, difficult to confirm. 4. Appearance of this area and the LEFT lung base raises the question of recent but not acute pulmonary emboli. Further assessment with venous Doppler evaluation including LEFT upper extremity (due to PICC line) may be helpful to exclude a source which might change patient management. 5. Ascites in the upper abdomen with loculated fluid in the gastrohepatic ligament and omental stranding. Constellation of findings and appearance in addition of malignancy could also be seen with peritonitis. The liver shows lobular contours. Correlate with any laboratory evidence of peritonitis either primary or related to spontaneous bacterial peritonitis. 6. Aortic atherosclerosis. 7. LEFT-sided PICC line terminates at the midportion of the superior vena cava. Aortic Atherosclerosis (ICD10-I70.0). A call is out to the referring provider to further discuss findings in the above case. Electronically Signed: By: GZetta BillsM.D. On: 09/01/2022 14:57   DG Abd 2 Views  Result Date: 09/01/2022 CLINICAL DATA:  Ascites EXAM: ABDOMEN - 2 VIEW COMPARISON:  06/29/2023 FINDINGS: Nasogastric tube tip projects over mid stomach. Air-filled nondistended ascending through transverse colon. No bowel dilatation or bowel wall thickening. Osseous structures unremarkable. No pathologic calcifications. IMPRESSION: No acute abnormalities. Electronically Signed   By: MLavonia DanaM.D.   On: 09/01/2022 13:36    Scheduled Meds:  Benzocaine   Mouth/Throat Once   bisacodyl  10 mg Rectal Daily   Chlorhexidine Gluconate Cloth  6 each Topical Daily   insulin aspart  0-15  Units Subcutaneous Q4H   lip balm   Topical BID   metoCLOPramide (REGLAN) injection  10 mg Intravenous Q6H   metoprolol tartrate  5 mg Intravenous Q6H   pantoprazole (PROTONIX) IV  40 mg Intravenous Q12H   simethicone  80 mg Oral QID   sodium chloride flush  10-40 mL Intracatheter Q12H   Continuous Infusions:  sodium chloride Stopped (08/25/22 0520)   sodium chloride Stopped (09/01/22 0905)   ceFEPime (MAXIPIME) IV 2 g (09/03/22 0454)   heparin 1,900 Units/hr (09/03/22 0455)   ondansetron (ZOFRAN) IV     TPN ADULT (ION) 55 mL/hr at 09/03/22 0435   TPN ADULT (ION)       LOS: 14 days   Shelly Coss, MD [facility] P2/25/2024, 10:49 AM

## 2022-09-03 NOTE — Plan of Care (Signed)

## 2022-09-03 NOTE — Progress Notes (Signed)
Manufacturing engineer [facility]) Hospital Liaison Note  Received request from PMT provider/Kasie M., NP for family interest in [facility]. Upon further discussion with PMT provider, patient was anxious during bedside visit w. PMT. Due to this, [facility] will conduct bedside assessment on 2.26 to provide patient with time to process comfort care transition.  Please do not hesitate to call with any hospice related questions.    Thank you for the opportunity to participate in this patient's care.  Phillis Haggis, MSW [facility] Liaison  [phone]

## 2022-09-03 NOTE — Progress Notes (Signed)
PHARMACY - TOTAL PARENTERAL NUTRITION CONSULT NOTE   Indication:  Intolerance to enteral feeding  Patient Measurements: Height: '5\' 5"'$  (165.1 cm) Weight: 102.1 kg (225 lb 1.6 oz) IBW/kg (Calculated) : 57 TPN AdjBW (KG): 67.7 Body mass index is 37.46 kg/m.  Assessment:  Pt is a 52 yoF admitted on 2/11 with abdominal pain, N/V ongoing for several weeks. Imaging revealed gastric pneumatosis, penumobilia - no indication for surgery at this time. Pt has had minimal PO intake > 1 week. Pharmacy consulted to initiate TPN.  Glucose / Insulin: T2DM. CBG goal < 150 - on mSSI q4h (used 13 units in the oast 24 hrs) - CBG range: 141-158  Electrolytes:  - Na down to wnl (140) and trending down -Ca low 8.0 with normal albumin - however, expect Albumin values on 2/20 and 2/22 are falsely elevated 2/2 albumin IV q8h. Previous albumin low at 2.3 on 2/16.  -All other lytes WNL Renal: SCr <1; BUN up 31 Hepatic: LFTs on 2/22: T.bili slightly elevated. Alk Phos, LFTs WNL.  - Alb WNL (note: was on albumin TID from 2/15 > 2/22) - TG 105 (2/22) Intake / Output; MIVF: mIVF: NS @ KVO - I/O: +561 ml -UOP: 700 mL.  - Emesis/NG output: 10 mL  GI Imaging: -2/11 CT Abd: Portal vein and mesenteric venous gas. Mild pneumatosis in the gastric wall without evidence of gastric wall thickening. Moderate wall thickening involving transverse colon (differential includes infectious or ischemic colitis or neoplasm). Moderate ascites, cholethiasis.  -2/18 CT Abd: Persistent mild ascites (infectious or neoplastic etiology?). Improved appearance of transverse colon, no evidence of perforation. Resolution of gastric pneumatosis as well as portal and mesenteric venous gas. -2/23 chest CT: Potential eccentric nonocclusive thrombus in the LEFT lower lobe pulmonary artery. Ascites in the upper abdomen with loculated fluid in the gastrohepatic ligament and omental stranding.  GI Surgeries / Procedures:  -2/21: CT core omental  biopsy  Central access: Double lumen PICC placed 2/20 TPN start date: 2/20   Nutritional Goals: Goal TPN rate is 55 mL/hr (provides 93 g of protein and 1790 kcals per day)  Discussed with MD on 2/20: Using Clinisol 15% to concentrate TPN to be able to meet nutritional needs with minimal volume.  Discussed again with MD on 2/21 - reports pt remains volume overloaded with ascites and crackles. Planning to continue with concentrated TPN and MD to let pharmacy know if plan changes.   RD Assessment: Estimated Needs Total Energy Estimated Needs: 1800-2000 kcals Total Protein Estimated Needs: 80-100 grams Total Fluid Estimated Needs: >/= 1.7L  Current Nutrition:  NPO except for sips with meds, ice chips TPN  Significant Events:  -2/23: Pt pulled PICC line early this morning ~0300. TPN order discontinued. MD placed order for new PICC.    Plan:   At 1800: Continue TPN at goal rate of 55 mL/hr Electrolytes in TPN:  Na 35 mEq/L - increased K 36mq/L Ca 2.5 mEq/L Mg 552m/L Phos 1520m/L.  Cl:Ac Max Cl (1:1.13) Add standard MVI and trace elements to TPN.  Continue SSI at moderate q4h and [facility]. Monitor TPN labs on Mon/Thurs  Thank you for allowing pharmacy to be a part of this patient's care.  MelRoyetta AsalharmD, BCPS Clinical Pharmacist [place] Please utilize Amion for appropriate phone number to reach the unit pharmacist ([facility] [place]/25/2024 7:46 AM

## 2022-09-03 NOTE — TOC Progression Note (Signed)
Transition of Care [facility], [place]) - Progression Note    Patient Details  Name: [name] MRN: [id number] Date of Birth: 07-23-1950  Transition of Care [facility]) CM/SW Contact  Henrietta Dine, RN Phone Number: 09/03/2022, 3:42 PM  Clinical Narrative:    Pt transitioned to comfort care; awaiting bedside eval for residential hospice at [facility]; see hospice's progress note.        Expected Discharge Plan and Services                                               Social Determinants of Health (SDOH) Interventions SDOH Screenings   Food Insecurity: No Food Insecurity (08/21/2022)  Housing: Low Risk  (08/21/2022)  Transportation Needs: No Transportation Needs (08/21/2022)  Utilities: Not At Risk (08/21/2022)  Tobacco Use: Medium Risk (09/01/2022)    Readmission Risk Interventions     No data to display

## 2022-09-04 DIAGNOSIS — R112 Nausea with vomiting, unspecified: Secondary | ICD-10-CM | POA: Diagnosis not present

## 2022-09-04 DIAGNOSIS — F411 Generalized anxiety disorder: Secondary | ICD-10-CM

## 2022-09-04 DIAGNOSIS — C786 Secondary malignant neoplasm of retroperitoneum and peritoneum: Secondary | ICD-10-CM | POA: Diagnosis not present

## 2022-09-04 DIAGNOSIS — Z515 Encounter for palliative care: Secondary | ICD-10-CM

## 2022-09-04 DIAGNOSIS — R197 Diarrhea, unspecified: Secondary | ICD-10-CM | POA: Diagnosis not present

## 2022-09-04 DIAGNOSIS — R1084 Generalized abdominal pain: Secondary | ICD-10-CM | POA: Diagnosis not present

## 2022-09-04 MED ORDER — PHENOL 1.4 % MT LIQD
2.0000 | Freq: Four times a day (QID) | OROMUCOSAL | Status: DC
  Administered 2022-09-04 – 2022-09-05 (×3): 2 via OROMUCOSAL
  Filled 2022-09-04: qty 177

## 2022-09-04 NOTE — Discharge Summary (Signed)
Physician Discharge Summary  [name] [id number] DOB: 06/26/1951 DOA: 08/20/2022  PCP: London Pepper, MD  Admit date: 08/20/2022 Discharge date: 09/04/2022  Admitted From: Home Disposition:  residential hospice  Discharge Condition:Stable CODE STATUS:Comfort Care Diet recommendation: Regular   Brief/Interim Summary:  Patient is a 72 year old female with history of obesity, diabetes type 2, anxiety, depression, CVA, hypertension, seizure disorder who presented with worsening abdominal pain for 3 weeks, decreased appetite, unintentional weight loss, diarrhea, vomiting.  On presentation, she had leukocytosis and elevated lactate of 2.2. CT abdomen/pelvis showed possible infectious or ischemic colitis indicated by colonic wall thickening, suspected peritoneal carcinomatosis due to presence of ascites, omental stranding, mesenteric venous gas and pneumatosis.  General surgery consulted.    IR did omental biopsy, elevated CA125 for suspected malignancy.  Biopsy showed high-grade serous carcinoma.  Hospital course remarkable for persistent acute hypoxic respiratory failure, CT angiogram showed left-sided PE, left-sided consolidation, left-sided pleural effusion.  Remains on TPN, NG tube.  Abdomen remains distended, no bowel movement for last several days.  Continues to complain of abdominal pain.  Palliative care consulted as per oncology recommendation for goals of care.  CODE STATUS changed to DNR.  Family met with palliative care , decision made on comfort care.  Currently she is on full comfort care, plan for transfer to residential hospice   Following problems were addressed during the hospitalization:  Abdominal  pain/pneumobilia/gastric pneumatosis/high-grade serous carcinoma: Presented with nausea, vomiting, diarrhea, abdominal pain, weight loss.  Found to have ascites, underwent paracentesis x3.  Imaging showed possible colitis/peritoneal carcinomatosis, omental stranding.  .  Cytology  showed atypical cells.  CA 19-9, CEA normal but CEA-125 elevated.  IR did omental biopsy on 2/21, biopsy report showed high-grade serous carcinoma Made npo,NG tube was placed. General surgery was closely following.She was also on TPN. Abdomen remains distended.No bowel sounds GI was also following earlier, now signed off. Oncology consulted and following.  Recommended palliative care consultation given her poor prognosis.Now on comfort care   Acute hypoxic respiratory failure: Continued to complain of shortness of breath and she remains in mild sinus tachycardia.  CT angiogram done on 2/23 showed left-sided nonocclusive PE, left-sided consolidation, effusion Status post US guided thoracentesis with 800 mL fluid removal on 2/24.  Also started on broad spectrum antibiotics fro covering pneumonia. Now on comfort care   Macrocytosis/vitamin B12 deficiency: Low vitamin B12.  Given a dose of IM vitamin B12 injection.     AKI on CKD stage IIIa: last  kidney function at baseline.  Baseline creatinine around 1.1.   Hypertension/sinus tachycardia: Remains hypertensive,tachycardiac. Takes metoprolol, losartan, hydrochlorothiazide at home.On comfort care    Elevated liver enzymes/hyperbilirubinemia: Suspected to be from hypoperfusion.     Diabetes type 2: Currently sugars are stable.  Continue sliding scale and monitor   Anxiety: On as needed Xanax   History of prior CVA   Morbid obesity: BMI 39   Goals of care: Has miserable condition with distended abdomen, no bowel movement, and NG tube.  Persistent abdominal pain.  Case discussed with oncology, consulted palliative care for goals of care.  Poor prognosis.  Dr. Chryl Heck planning discussed with the sister.  Palliative care met with family and initiated for comfort care.  Plan for transfer to residential hospice     Discharge Diagnoses:  Principal Problem:   Nausea, vomiting, and diarrhea Active Problems:   Hypertension   AKI (acute kidney  injury) ([facility] [facility])   Morbid obesity (HCC)   Hyperglycemia   Ascites  Pneumobilia   Diabetes mellitus type 2, noninsulin dependent (HCC)   Generalized anxiety disorder   Cholelithiasis   Slow rate of speech - history of CVA 2012   Abdominal pain, diffuse    Discharge Instructions   Allergies as of 09/04/2022       Reactions   Lorazepam Other (See Comments)   Hallucinations   Nsaids Other (See Comments)   Gastric pneumatosis        Medication List     STOP taking these medications    acetaminophen 500 MG tablet Commonly known as: TYLENOL   ALPRAZolam 0.25 MG tablet Commonly known as: XANAX   citalopram 20 MG tablet Commonly known as: CELEXA   dextromethorphan 30 MG/5ML liquid Commonly known as: DELSYM   dicyclomine 20 MG tablet Commonly known as: BENTYL   hydrochlorothiazide 12.5 MG tablet Commonly known as: HYDRODIURIL   hydroxypropyl methylcellulose / hypromellose 2.5 % ophthalmic solution Commonly known as: ISOPTO TEARS / GONIOVISC   loratadine 10 MG tablet Commonly known as: CLARITIN   losartan 100 MG tablet Commonly known as: COZAAR   metoprolol tartrate 50 MG tablet Commonly known as: LOPRESSOR   oxymetazoline 0.05 % nasal spray Commonly known as: AFRIN   Pepcid AC Maximum Strength 20 MG tablet Generic drug: famotidine   pravastatin 10 MG tablet Commonly known as: PRAVACHOL        Allergies  Allergen Reactions   Lorazepam Other (See Comments)    Hallucinations   Nsaids Other (See Comments)    Gastric pneumatosis    Consultations: Oncology, GI, surgery, palliative care   Procedures/Studies: US THORACENTESIS ASP PLEURAL SPACE W/IMG GUIDE  Result Date: 09/02/2022 INDICATION: 72 year old female presents with shortness of breath. Previous imaging showed left PE and large left pleural effusion. Request for therapeutic and diagnostic thoracentesis. EXAM: ULTRASOUND GUIDED LEFT THORACENTESIS MEDICATIONS: 10 mL 1% lidocaine  COMPLICATIONS: None immediate. PROCEDURE: An ultrasound guided thoracentesis was thoroughly discussed with the patient and questions answered. The benefits, risks, alternatives and complications were also discussed. The patient understands and wishes to proceed with the procedure. Written consent was obtained. Ultrasound was performed to localize and mark an adequate pocket of fluid in the left chest. The area was then prepped and draped in the normal sterile fashion. 1% Lidocaine was used for local anesthesia. Under ultrasound guidance a 19 gauge, 10-cm, Yueh catheter was introduced. Thoracentesis was performed. The catheter was removed and a dressing applied. FINDINGS: A total of approximately 800 mL of hazy amber fluid was removed. Samples were sent to the laboratory as requested by the clinical team. Post procedure chest X-ray reviewed, negative for pneumothorax. IMPRESSION: Successful ultrasound guided left thoracentesis yielding 800 mL of pleural fluid. Read by: Durenda Guthrie, PA-C Electronically Signed   By: Jacqulynn Cadet M.D.   On: 09/02/2022 11:23   DG CHEST PORT 1 VIEW  Result Date: 09/02/2022 CLINICAL DATA:  Post thoracentesis. EXAM: PORTABLE CHEST 1 VIEW COMPARISON:  Chest x-ray dated 08/30/2022. Chest CT dated 09/01/2022. FINDINGS: Significantly improved aeration at the LEFT lung base status post thoracentesis. No pneumothorax is seen. RIGHT lung is clear. Heart size and mediastinal contours are stable. Enteric tube passes below the diaphragm. LEFT-sided PICC line in place with tip adequately positioned at the level of the mid/upper SVC. IMPRESSION: Significantly improved aeration at the LEFT lung base status post thoracentesis. No pneumothorax seen. Electronically Signed   By: Franki Cabot M.D.   On: 09/02/2022 11:01   DG Abd Portable 1V  Result Date: 09/02/2022  CLINICAL DATA:  72 year old female status post nasogastric tube placement. EXAM: PORTABLE ABDOMEN - 1 VIEW COMPARISON:  09/01/2022.  FINDINGS: Nasogastric tube tip in the distal body of the stomach with side port in the proximal stomach. Moderate to large left pleural effusion with atelectasis and/or consolidation in the base of the left lung. Visualized bowel-gas pattern in the upper abdomen is unremarkable. Lower abdomen is incompletely imaged. IMPRESSION: 1. Nasogastric tube positioned in the stomach, as above. Electronically Signed   By: Vinnie Langton M.D.   On: 09/02/2022 06:57   VAS [place] UPPER EXTREMITY VENOUS DUPLEX  Result Date: 09/01/2022 UPPER VENOUS STUDY  Patient Name:  TOMESHIA NISSLEY  Date of Exam:   09/01/2022 Medical Rec #: [id number]     Accession #:    [id number] Date of Birth: 1950-11-17     Patient Gender: F Patient Age:   2 years Exam Location:  [facility] Procedure:      VAS [place] UPPER EXTREMITY VENOUS DUPLEX Referring Phys: Goodwin Kamphaus --------------------------------------------------------------------------------  Indications: pulmonary embolism Limitations: Poor ultrasound/tissue interface, bandages and line. Comparison Study: No prior studies. Performing Technologist: Oliver Hum RVT  Examination Guidelines: A complete evaluation includes B-mode imaging, spectral Doppler, color Doppler, and power Doppler as needed of all accessible portions of each vessel. Bilateral testing is considered an integral part of a complete examination. Limited examinations for reoccurring indications may be performed as noted.  Right Findings: +----------+------------+---------+-----------+----------+-------+ RIGHT     CompressiblePhasicitySpontaneousPropertiesSummary +----------+------------+---------+-----------+----------+-------+ Subclavian    Full       Yes       Yes                      +----------+------------+---------+-----------+----------+-------+  Left Findings: +----------+------------+---------+-----------+----------+-------+ LEFT      CompressiblePhasicitySpontaneousPropertiesSummary  +----------+------------+---------+-----------+----------+-------+ IJV           Full       Yes       Yes                      +----------+------------+---------+-----------+----------+-------+ Subclavian    Full       Yes       Yes                      +----------+------------+---------+-----------+----------+-------+ Axillary      Full       Yes       Yes                      +----------+------------+---------+-----------+----------+-------+ Brachial      Full       Yes       Yes                      +----------+------------+---------+-----------+----------+-------+ Radial        Full                                          +----------+------------+---------+-----------+----------+-------+ Ulnar         Full                                          +----------+------------+---------+-----------+----------+-------+ Cephalic      Full                                          +----------+------------+---------+-----------+----------+-------+  Basilic       Full                                          +----------+------------+---------+-----------+----------+-------+  Summary:  Right: No evidence of thrombosis in the subclavian.  Left: No evidence of deep vein thrombosis in the upper extremity. No evidence of superficial vein thrombosis in the upper extremity.  *See table(s) above for measurements and observations.  Diagnosing physician: Orlie Pollen Electronically signed by Orlie Pollen on 09/01/2022 at 6:57:52 PM.    Final    US Paracentesis  Result Date: 09/01/2022 INDICATION: Patient with history of chronic kidney disease, abdominal pain, omental stranding, recurrent ascites. Request received for therapeutic paracentesis. EXAM: ULTRASOUND GUIDED THERAPEUTIC PARACENTESIS MEDICATIONS: 8 ml 1% lidocaine COMPLICATIONS: None immediate. PROCEDURE: Informed written consent was obtained from the patient after a discussion of the risks, benefits and alternatives  to treatment. A timeout was performed prior to the initiation of the procedure. Initial ultrasound scanning demonstrates a moderate amount of ascites within the right lower abdominal quadrant. The right lower abdomen was prepped and draped in the usual sterile fashion. 1% lidocaine was used for local anesthesia. Following this, a 19 gauge, 10-cm, Yueh catheter was introduced. An ultrasound image was saved for documentation purposes. The paracentesis was performed. The catheter was removed and a dressing was applied. The patient tolerated the procedure well without immediate post procedural complication. FINDINGS: A total of approximately 2.3 liters of amber fluid was removed. IMPRESSION: Successful ultrasound-guided therapeutic paracentesis yielding 2.3 liters of peritoneal fluid. Read by: Rowe Robert, PA-C Electronically Signed   By: Aletta Edouard M.D.   On: 09/01/2022 16:43   CT Angio Chest Pulmonary Embolism (PE) W or WO Contrast  Addendum Date: 09/01/2022   ADDENDUM REPORT: 09/01/2022 15:04 ADDENDUM: These results were called by telephone at the time of interpretation on 09/01/2022 at 3:04 pm to provider Erricka Falkner [facility] , who verbally acknowledged these results. Electronically Signed   By: Zetta Bills M.D.   On: 09/01/2022 15:04   Result Date: 09/01/2022 CLINICAL DATA:  Pulmonary embolism, shortness of breath. EXAM: CT ANGIOGRAPHY CHEST WITH CONTRAST TECHNIQUE: Multidetector CT imaging of the chest was performed using the standard protocol during bolus administration of intravenous contrast. Multiplanar CT image reconstructions and MIPs were obtained to evaluate the vascular anatomy. RADIATION DOSE REDUCTION: This exam was performed according to the departmental dose-optimization program which includes automated exposure control, adjustment of the mA and/or kV according to patient size and/or use of iterative reconstruction technique. CONTRAST:  30m OMNIPAQUE IOHEXOL 350 MG/ML SOLN COMPARISON:  Chest  radiograph of August 30, 2022 and CT of the abdomen and pelvis which was performed on August 27, 2022. FINDINGS: Cardiovascular: The aorta displays a normal three-vessel branching pattern. Aortic atherosclerosis both calcified and noncalcified without dilation. Normal heart size without pericardial effusion or nodularity. Central pulmonary vasculature is opacified to 174 Hounsfield units, study is limited by bolus timing. No central pulmonary embolism. w eccentric filling defect in LEFT lower lobe pulmonary artery on image 46/4. Potential eccentric thrombi in RIGHT lower lobe segmental branches on image 55/4 Non opacification of peripheral LEFT lower lobe pulmonary artery leading into area dense consolidative change. In general peripheral branches of the pulmonary arterial bed are not well opacified elsewhere in the chest likely due to bolus timing factors but LEFT lower lobe is certainly more pronounced than other areas.  LEFT-sided PICC line terminates at the midportion of the superior vena cava. Mediastinum/Nodes: No adenopathy in the chest. Lungs/Pleura: Dense LEFT lower lobe consolidative change with large LEFT-sided pleural effusion. No pleural effusion in the RIGHT chest. Mild interstitial thickening and ground-glass in the upper lobes. Upper Abdomen: Loculated fluid in the gastrohepatic ligament. Signs of gastric edema. Perihepatic ascites and partially visualized stranding or nodularity in the omentum. No acute upper abdominal process otherwise to the extent evaluated. Musculoskeletal: No chest wall mass. No acute bone finding or destructive bone process. Review of the MIP images confirms the above findings. IMPRESSION: 1. Study limited by bolus timing factors. No central pulmonary embolism. 2. Potential eccentric nonocclusive thrombus in the LEFT lower lobe pulmonary artery. Peripheral LEFT lower lobe pulmonary artery leading into dense consolidative change does not enhance to the same degree as other  vessels in the chest. Significance uncertain. Relative hypoperfusion or larger nonvisualized embolism to this area is considered. Consolidative changes are more suggestive of collapse due to large pleural effusion potentially associated with underlying pneumonia rather than pulmonary infarct based on appearance though findings remain nonspecific. 3. Motion limited assessment in limited evaluation due to bolus timing with eccentric filling of RIGHT lower lobe segmental branches suggested, difficult to confirm. 4. Appearance of this area and the LEFT lung base raises the question of recent but not acute pulmonary emboli. Further assessment with venous Doppler evaluation including LEFT upper extremity (due to PICC line) may be helpful to exclude a source which might change patient management. 5. Ascites in the upper abdomen with loculated fluid in the gastrohepatic ligament and omental stranding. Constellation of findings and appearance in addition of malignancy could also be seen with peritonitis. The liver shows lobular contours. Correlate with any laboratory evidence of peritonitis either primary or related to spontaneous bacterial peritonitis. 6. Aortic atherosclerosis. 7. LEFT-sided PICC line terminates at the midportion of the superior vena cava. Aortic Atherosclerosis (ICD10-I70.0). A call is out to the referring provider to further discuss findings in the above case. Electronically Signed: By: Zetta Bills M.D. On: 09/01/2022 14:57   DG Abd 2 Views  Result Date: 09/01/2022 CLINICAL DATA:  Ascites EXAM: ABDOMEN - 2 VIEW COMPARISON:  06/29/2023 FINDINGS: Nasogastric tube tip projects over mid stomach. Air-filled nondistended ascending through transverse colon. No bowel dilatation or bowel wall thickening. Osseous structures unremarkable. No pathologic calcifications. IMPRESSION: No acute abnormalities. Electronically Signed   By: Lavonia Dana M.D.   On: 09/01/2022 13:36   [place] EKG SITE RITE  Result Date:  09/01/2022 If [facility] image not attached, placement could not be confirmed due to current cardiac rhythm.  DG CHEST PORT 1 VIEW  Result Date: 08/30/2022 CLINICAL DATA:  72 year old female with history of cough. EXAM: PORTABLE CHEST - 1 VIEW COMPARISON:  09/04/2010 FINDINGS: Apical projection. The mediastinal contours are within normal limits. No cardiomegaly. Right upper extremity PICC place with catheter tip terminating near the cavoatrial junction. Gastric decompression tube in place, terminating off the inferior aspect this image. Low lung volumes. Mild prominence of pulmonary vasculature without evidence of overt pulmonary edema. No focal consolidation, pleural effusion, pneumothorax. No acute osseous abnormality. IMPRESSION: Low lung volumes with mild prominence of pulmonary vasculature, no evidence of overt pulmonary edema. Electronically Signed   By: Ruthann Cancer M.D.   On: 08/30/2022 13:31   CT ABDOMINAL MASS BIOPSY  Result Date: 08/30/2022 CLINICAL DATA:  Ascites and omental stranding. Atypical cells on paracentesis cytology. EXAM: CT GUIDED CORE BIOPSY OF OMENTUM ANESTHESIA/SEDATION:  Intravenous Fentanyl 49mg and Versed 0.'5mg'$  were administered as conscious sedation during continuous monitoring of the patient's level of consciousness and physiological / cardiorespiratory status by the radiology RN, with a total moderate sedation time of 10 minutes. PROCEDURE: The procedure risks, benefits, and alternatives were explained to the patient. Questions regarding the procedure were encouraged and answered. The patient understands and consents to the procedure. select axial scans through the abdomen were obtained. Omentum localized, appropriate skin entry site was determined and marked. The operative field was prepped with chlorhexidinein a sterile fashion, and a sterile drape was applied covering the operative field. A sterile gown and sterile gloves were used for the procedure. Local anesthesia was  provided with 1% Lidocaine. Under CT fluoroscopic guidance, a 17 gauge trocar needle was advanced to the margin of the lesion. Once needle tip position was confirmed, coaxial 18-gauge core biopsy samples were obtained, submitted in saline to surgical pathology. The guide needle was removed. Postprocedure scans show no hemorrhage or other apparent complication. The patient tolerated the procedure well. COMPLICATIONS: None immediate FINDINGS: Omental localized. Representative core biopsy samples obtained as above. IMPRESSION: Technically successful CT-guided core biopsy, omentum. RADIATION DOSE REDUCTION: This exam was performed according to the departmental dose-optimization program which includes automated exposure control, adjustment of the mA and/or kV according to patient size and/or use of iterative reconstruction technique. Electronically Signed   By: DLucrezia EuropeM.D.   On: 08/30/2022 10:49   DG Abd Portable 1V  Result Date: 08/29/2022 CLINICAL DATA:  NGT placement EXAM: PORTABLE ABDOMEN - 1 VIEW COMPARISON:  08/26/2022 FINDINGS: Limited image of the upper abdomen demonstrates NG tube superimposed with stomach, below the diaphragm. IMPRESSION: NGT in place. Electronically Signed   By: JSammie BenchM.D.   On: 08/29/2022 12:34   [place] SITE RITE  Result Date: 08/28/2022 If Site Rite image not attached, placement could not be confirmed due to current cardiac rhythm.  CT ABDOMEN PELVIS W CONTRAST  Result Date: 08/27/2022 CLINICAL DATA:  Abdominal pain, history of recent paracentesis EXAM: CT ABDOMEN AND PELVIS WITH CONTRAST TECHNIQUE: Multidetector CT imaging of the abdomen and pelvis was performed using the standard protocol following bolus administration of intravenous contrast. RADIATION DOSE REDUCTION: This exam was performed according to the departmental dose-optimization program which includes automated exposure control, adjustment of the mA and/or kV according to patient size and/or use of  iterative reconstruction technique. CONTRAST:  1048mOMNIPAQUE IOHEXOL 300 MG/ML  SOLN COMPARISON:  08/20/2022 FINDINGS: Lower chest: New small left pleural effusion is noted with left lower lobe consolidation. No parenchymal nodules are seen. Hepatobiliary: Previously seen portal venous air within the liver has resolved in the interval no focal mass lesion is seen. The gallbladder is well distended with multiple gallstones stable from the prior exam. No biliary ductal dilatation is seen. Pancreas: Unremarkable. No pancreatic ductal dilatation or surrounding inflammatory changes. Spleen: Normal in size without focal abnormality. Adrenals/Urinary Tract: Adrenal glands are within normal limits. Kidneys demonstrate a normal enhancement pattern bilaterally. No renal calculi or obstructive changes are seen. Normal excretion is seen bilaterally. The bladder is decompressed by Foley catheter. Stomach/Bowel: Colon is predominately decompressed. The transverse colon demonstrates some air within in the previously seen diffuse wall thickening and pericolonic inflammatory change is reduced in the interval from the prior exam. The appendix is not well visualized. Few mildly prominent loops of small bowel are noted without definitive obstructive change. Edema within the mesentery is noted in these changes are likely reactive to  the ascites identified. The stomach is well distended with fluid and contrast material. No extravasation is identified. The previously seen pneumatosis in the gastric wall has resolved as has the degree of portal and mesenteric venous gas. Small hiatal hernia is again noted. Vascular/Lymphatic: Atherosclerotic calcifications of the abdominal aorta are noted. No sizable adenopathy is seen. Reproductive: Uterus and bilateral adnexa are unremarkable. Other: Mild to moderate ascites remains in the abdomen despite interval paracentesis yesterday. Some soft tissue changes are again noted in the omental fat  anteriorly. This is similar to that seen on prior exam. Musculoskeletal: Degenerative changes of lumbar spine are noted. No acute bony abnormality is seen. Mild changes of anasarca are noted in the lateral abdominal walls. IMPRESSION: Persistent mild ascites with changes in the omental fat anteriorly again both infectious and neoplastic etiologies deserve consideration. Improved appearance of the transverse colon when compared with the prior exam. No evidence of perforation is noted. Resolution of previously seen gastric pneumatosis as well as portal and mesenteric venous gas. Cholelithiasis without complicating factors. New left pleural effusion and left lower lobe consolidation. Electronically Signed   By: Inez Catalina M.D.   On: 08/27/2022 17:14   US Paracentesis  Result Date: 08/27/2022 INDICATION: Recurrent ascites of unknown etiology. Request for therapeutic paracentesis. EXAM: ULTRASOUND GUIDED PARACENTESIS MEDICATIONS: 1% lidocaine 20 mL COMPLICATIONS: None immediate. PROCEDURE: Informed written consent was obtained from the patient after a discussion of the risks, benefits and alternatives to treatment. A timeout was performed prior to the initiation of the procedure. Initial ultrasound scanning demonstrates a large amount of ascites within the left lateral abdomen. The left lateral abdomen was prepped and draped in the usual sterile fashion. 1% lidocaine was used for local anesthesia. Following this, a 19 gauge, 10-cm, Yueh catheter was introduced. An ultrasound image was saved for documentation purposes. The paracentesis was performed. The catheter was removed and a dressing was applied. The patient tolerated the procedure well without immediate post procedural complication. FINDINGS: A total of approximately 2.2 L of clear yellow fluid was removed. IMPRESSION: Successful ultrasound-guided paracentesis yielding 2.2 liters of peritoneal fluid. Procedure performed by: Gareth Eagle, PA-C Electronically  Signed   By: Albin Felling M.D.   On: 08/27/2022 11:34   DG Abd Portable 1V  Result Date: 08/26/2022 CLINICAL DATA:  Nausea vomiting EXAM: PORTABLE ABDOMEN - 1 VIEW COMPARISON:  08/23/2022 FINDINGS: The bowel gas pattern is normal. No radio-opaque calculi or other significant radiographic abnormality are seen. IMPRESSION: Nonspecific bowel gas pattern. Electronically Signed   By: Sammie Bench M.D.   On: 08/26/2022 10:10   DG Abd Portable 1V  Result Date: 08/23/2022 CLINICAL DATA:  Abdominal pain. EXAM: PORTABLE ABDOMEN - 1 VIEW COMPARISON:  09/04/2010. FINDINGS: Gas-filled bowel loops without dilation. No free air. The right abdominal wall is excluded from the field of view. No radiopaque calculi. Degenerative changes of the spine. IMPRESSION: Gas-filled bowel loops without dilation. Electronically Signed   By: Emmit Alexanders M.D.   On: 08/23/2022 11:01   US Paracentesis  Result Date: 08/22/2022 INDICATION: Abdominal distention. Ascites. Request for diagnostic and therapeutic paracentesis. EXAM: ULTRASOUND GUIDED LEFT LOWER QUADRANT PARACENTESIS MEDICATIONS: 1% plain lidocaine, 10 mL COMPLICATIONS: None immediate. PROCEDURE: Informed written consent was obtained from the patient after a discussion of the risks, benefits and alternatives to treatment. A timeout was performed prior to the initiation of the procedure. Initial ultrasound scanning demonstrates a large amount of ascites within the left lower abdominal quadrant. The left lower abdomen was  prepped and draped in the usual sterile fashion. 1% lidocaine was used for local anesthesia. Following this, a 19 gauge, 10-cm, Yueh catheter was introduced. An ultrasound image was saved for documentation purposes. The paracentesis was performed. The catheter was removed and a dressing was applied. The patient tolerated the procedure well without immediate post procedural complication. FINDINGS: A total of approximately 5.5 L of clear yellow fluid was  removed. Samples were sent to the laboratory as requested by the clinical team. IMPRESSION: Successful ultrasound-guided paracentesis yielding 5.5 liters of peritoneal fluid. Read by: Ascencion Dike PA-C Electronically Signed   By: Corrie Mckusick D.O.   On: 08/22/2022 12:27   CT ABDOMEN PELVIS WO CONTRAST  Result Date: 08/20/2022 CLINICAL DATA:  Right lower quadrant pain.  Sepsis. EXAM: CT ABDOMEN AND PELVIS WITHOUT CONTRAST TECHNIQUE: Multidetector CT imaging of the abdomen and pelvis was performed following the standard protocol without IV contrast. RADIATION DOSE REDUCTION: This exam was performed according to the departmental dose-optimization program which includes automated exposure control, adjustment of the mA and/or kV according to patient size and/or use of iterative reconstruction technique. COMPARISON:  08/15/2010 FINDINGS: Lower chest: No acute findings. Hepatobiliary: No mass visualized on this unenhanced exam. Intrahepatic portal venous gas is seen. Gallstones are seen, however there is no evidence of cholecystitis or biliary dilatation. Pancreas: No mass or inflammatory process visualized on this unenhanced exam. Spleen:  Within normal limits in size. Adrenals/Urinary tract: No evidence of urolithiasis or hydronephrosis. Unremarkable unopacified urinary bladder. Stomach/Bowel: Small hiatal hernia is seen. No evidence of bowel obstruction. Mild pneumatosis is seen in the gastric wall, however there is no evidence of gastric wall thickening. Small amount of venous gas is seen within the mesentery. No evidence of free intraperitoneal air or abscess. Moderate wall thickening is seen involving the transverse colon, with soft tissue stranding seen throughout the omental fat. Moderate ascites is noted. Vascular/Lymphatic: No pathologically enlarged lymph nodes identified. No evidence of abdominal aortic aneurysm. Aortic atherosclerotic calcification incidentally noted. Reproductive: Mildly enlarged uterus  is stable with small fibroids better visualized on previous contrast enhanced exam. Other:  None. Musculoskeletal:  No suspicious bone lesions identified. IMPRESSION: Portal venous and mesenteric venous gas. Mild pneumatosis in the gastric wall, without evidence of gastric wall thickening. Emphysematous gastritis cannot be excluded. Moderate wall thickening involving the transverse colon. Differential diagnosis includes infectious or ischemic colitis, or less likely neoplasm. Moderate ascites and diffuse omental soft tissue stranding. Differential diagnosis includes infectious etiologies and peritoneal carcinomatosis. Cholelithiasis. No radiographic evidence of cholecystitis. Electronically Signed   By: Marlaine Hind M.D.   On: 08/20/2022 12:38      Subjective: Seen and examined at bedside today.  Full comfort care, looks comfortable, not in distress.  Daughter at bedside  Discharge Exam: Vitals:   09/04/22 0551 09/04/22 1214  BP: 139/74 (!) 156/92  Pulse: (!) 114 (!) 115  Resp: 20 20  Temp: (!) 97.4 F (36.3 C) 98.5 F (36.9 C)  SpO2: (!) 88% 95%   Vitals:   09/03/22 0755 09/03/22 1408 09/04/22 0551 09/04/22 1214  BP: 136/82 (!) 116/99 139/74 (!) 156/92  Pulse: (!) 110 (!) 110 (!) 114 (!) 115  Resp: '20 18 20 20  '$ Temp: 97.8 F (36.6 C) 98.9 F (37.2 C) (!) 97.4 F (36.3 C) 98.5 F (36.9 C)  TempSrc: Oral Oral  Oral  SpO2: 98% 97% (!) 88% 95%  Weight: 103.9 kg     Height:        General:  Pt is sleeping, not in acute distress Cardiovascular: sinus tach Respiratory: Diminished sounds bilaterally Abdominal: distended Extremities: no edema, no cyanosis    The results of significant diagnostics from this hospitalization (including imaging, microbiology, ancillary and laboratory) are listed below for reference.     Microbiology: Recent Results (from the past 240 hour(s))  MRSA Next Gen by PCR, Nasal     Status: None   Collection Time: 09/01/22  3:46 PM   Specimen: Nasal  Mucosa; Nasal Swab  Result Value Ref Range Status   MRSA by PCR Next Gen NOT DETECTED NOT DETECTED Final    Comment: (NOTE) The GeneXpert MRSA Assay (FDA approved for NASAL specimens only), is one component of a comprehensive MRSA colonization surveillance program. It is not intended to diagnose MRSA infection nor to guide or monitor treatment for MRSA infections. Test performance is not FDA approved in patients less than 49 years old. Performed at [facility], [place] [address], [place], [place] [address]   Body fluid culture w Gram Stain     Status: None (Preliminary result)   Collection Time: 09/02/22 11:06 AM   Specimen: PATH Cytology Pleural fluid  Result Value Ref Range Status   Specimen Description   Final    PLEURAL LEFT SIDE Performed at [facility] [address] [place], [place] [address]    Special Requests   Final    NONE Performed at [facility], [place] [address] [place], [place] [address]    Gram Stain   Final    WBC PRESENT, PREDOMINANTLY MONONUCLEAR NO ORGANISMS SEEN    Culture   Final    NO GROWTH 2 DAYS Performed at [facility] [address] [place], [place] [address]    Report Status PENDING  Incomplete     Labs: BNP (last 3 results) Recent Labs    08/30/22 1050  BNP [id number]*   Basic Metabolic Panel: Recent Labs  Lab 08/30/22 0141 08/31/22 0448 09/01/22 0608 09/02/22 0125 09/03/22 0306  NA 141 146* 147* 145 140  K 3.7 3.8 4.2 4.0 4.2  CL 108 106 108 107 108  CO2 '28 31 28 30 26  '$ GLUCOSE 140* 139* 133* 148* 158*  BUN '19 17 11 '$ 24* 31*  CREATININE 0.82 0.95 0.85 0.97 0.94  CALCIUM 8.3* 8.9 9.0 8.3* 8.0*  MG 2.2 2.2 1.8 2.3 2.3  PHOS 2.6 2.8 2.3* 3.0 3.3   Liver Function Tests: Recent Labs  Lab 08/29/22 0331 08/31/22 0448  AST 31 27  ALT 19 16  ALKPHOS 37* 35*  BILITOT 1.6* 1.4*  PROT 6.3* 6.2*  ALBUMIN 4.6 4.4   No results for input(s): "LIPASE", "AMYLASE" in the  last 168 hours. No results for input(s): "AMMONIA" in the last 168 hours. CBC: Recent Labs  Lab 08/30/22 1051 08/31/22 0448 09/02/22 0125 09/03/22 0306  WBC 12.5* 12.1* 18.3* 15.9*  HGB 10.4* 10.5* 10.5* 10.8*  HCT 33.8* 34.3* 34.7* 35.4*  MCV 101.2* 102.7* 103.3* 102.9*  PLT 274 210 231 256   Cardiac Enzymes: No results for input(s): "CKTOTAL", "CKMB", "CKMBINDEX", "TROPONINI" in the last 168 hours. BNP: Invalid input(s): "POCBNP" CBG: Recent Labs  Lab 09/02/22 2005 09/02/22 2353 09/03/22 0413 09/03/22 0750 09/03/22 1150  GLUCAP 131* 149* 157* 171* 133*   D-Dimer Recent Labs    09/01/22 1610  DDIMER >20.00*   Hgb A1c No results for input(s): "HGBA1C" in the last 72 hours. Lipid Profile No results for input(s): "CHOL", "HDL", "  [place]", "TRIG", "CHOLHDL", "LDLDIRECT" in the last 72 hours. Thyroid function studies No results for input(s): "TSH", "T4TOTAL", "T3FREE", "THYROIDAB" in the last 72 hours.  Invalid input(s): "FREET3" Anemia work up No results for input(s): "VITAMINB12", "FOLATE", "FERRITIN", "TIBC", "IRON", "RETICCTPCT" in the last 72 hours. Urinalysis    Component Value Date/Time   COLORURINE AMBER (A) 08/20/2022 1040   APPEARANCEUR HAZY (A) 08/20/2022 1040   LABSPEC 1.021 08/20/2022 1040   PHURINE 5.0 08/20/2022 1040   GLUCOSEU NEGATIVE 08/20/2022 1040   HGBUR NEGATIVE 08/20/2022 1040   BILIRUBINUR NEGATIVE 08/20/2022 1040   KETONESUR NEGATIVE 08/20/2022 1040   PROTEINUR 30 (A) 08/20/2022 1040   UROBILINOGEN 0.2 09/04/2010 1051   NITRITE NEGATIVE 08/20/2022 1040   LEUKOCYTESUR TRACE (A) 08/20/2022 1040   Sepsis Labs Recent Labs  Lab 08/30/22 1051 08/31/22 0448 09/02/22 0125 09/03/22 0306  WBC 12.5* 12.1* 18.3* 15.9*   Microbiology Recent Results (from the past 240 hour(s))  MRSA Next Gen by PCR, Nasal     Status: None   Collection Time: 09/01/22  3:46 PM   Specimen: Nasal Mucosa; Nasal Swab  Result Value Ref Range Status   MRSA by  PCR Next Gen NOT DETECTED NOT DETECTED Final    Comment: (NOTE) The GeneXpert MRSA Assay (FDA approved for NASAL specimens only), is one component of a comprehensive MRSA colonization surveillance program. It is not intended to diagnose MRSA infection nor to guide or monitor treatment for MRSA infections. Test performance is not FDA approved in patients less than 54 years old. Performed at [facility], [place] [address] [place], [place] [address]   Body fluid culture w Gram Stain     Status: None (Preliminary result)   Collection Time: 09/02/22 11:06 AM   Specimen: PATH Cytology Pleural fluid  Result Value Ref Range Status   Specimen Description   Final    PLEURAL LEFT SIDE Performed at [facility] [address], [place], [place] [address]    Special Requests   Final    NONE Performed at [facility], [place] [address], [place], [place] [address]    Gram Stain   Final    WBC PRESENT, PREDOMINANTLY MONONUCLEAR NO ORGANISMS SEEN    Culture   Final    NO GROWTH 2 DAYS Performed at [facility] [address], [place], [place] [address]    Report Status PENDING  Incomplete    Please note: You were cared for by a hospitalist during your hospital stay. Once you are discharged, your primary care physician will handle any further medical issues. Please note that NO REFILLS for any discharge medications will be authorized once you are discharged, as it is imperative that you return to your primary care physician (or establish a relationship with a primary care physician if you do not have one) for your post hospital discharge needs so that they can reassess your need for medications and monitor your lab values.    Time coordinating discharge: 40 minutes  SIGNED:   Shelly Coss, MD  [facility] 09/04/2022, 2:06 PM Pager [phone]  If 7PM-7AM, please contact night-coverage [email or web address] Password [email or web address]

## 2022-09-04 NOTE — Progress Notes (Signed)
PROGRESS NOTE  [name]  [id number] DOB: [birth year] DOA: 08/20/2022 PCP: London Pepper, MD   Brief Narrative:  Patient is a 72 year old female with history of obesity, diabetes type 2, anxiety, depression, CVA, hypertension, seizure disorder who presented with worsening abdominal pain for 3 weeks, decreased appetite, unintentional weight loss, diarrhea, vomiting.  On presentation, she had leukocytosis and elevated lactate of 2.2. CT abdomen/pelvis showed possible infectious or ischemic colitis indicated by colonic wall thickening, suspected peritoneal carcinomatosis due to presence of ascites, omental stranding, mesenteric venous gas and pneumatosis.  General surgery consulted.    IR did omental biopsy, elevated CA125 for suspected malignancy.  Biopsy showed high-grade serous carcinoma.  Hospital course remarkable for persistent acute hypoxic respiratory failure, CT angiogram showed left-sided PE, left-sided consolidation, left-sided pleural effusion.  Remains on TPN, NG tube.  Abdomen remains distended, no bowel movement for last several days.  Continues to complain of abdominal pain.  Palliative care consulted as per oncology recommendation for goals of care.  CODE STATUS changed to DNR.  Family met with palliative care , decision made on comfort care.  Currently she is on full comfort care, plan for transfer to residential hospice  Assessment & Plan:  Principal Problem:   Nausea, vomiting, and diarrhea Active Problems:   Hypertension   AKI (acute kidney injury) ([place])   Morbid obesity ([place])   Hyperglycemia   Ascites   Pneumobilia   Diabetes mellitus type 2, noninsulin dependent ([place])   Generalized anxiety disorder   Cholelithiasis   Slow rate of speech - history of CVA 2012   Abdominal pain, diffuse  Abdominal  pain/pneumobilia/gastric pneumatosis/high-grade serous carcinoma: Presented with nausea, vomiting, diarrhea, abdominal pain, weight loss.  Found to have ascites, underwent  paracentesis x3.  Imaging showed possible colitis/peritoneal carcinomatosis, omental stranding.  .  Cytology showed atypical cells.  CA 19-9, CEA normal but CEA-125 elevated.  IR did omental biopsy on 2/21, biopsy report showed high-grade serous carcinoma Made npo,NG tube was placed. General surgery was closely following.She was also on TPN. Abdomen remains distended.No bowel sounds GI was also following earlier, now signed off. Oncology consulted and following.  Recommended palliative care consultation given her poor prognosis.Now on comfort care  Acute hypoxic respiratory failure: Continued to complain of shortness of breath and she remains in mild sinus tachycardia.  CT angiogram done on 2/23 showed left-sided nonocclusive PE, left-sided consolidation, effusion Status post US guided thoracentesis with 800 mL fluid removal on 2/24.  Also started on broad spectrum antibiotics fro covering pneumonia. Now on comfort care  Macrocytosis/vitamin B12 deficiency: Low vitamin B12.  Given a dose of IM vitamin B12 injection.    AKI on CKD stage IIIa: last  kidney function at baseline.  Baseline creatinine around 1.1.  Hypertension/sinus tachycardia: Remains hypertensive,tachycardiac. Takes metoprolol, losartan, hydrochlorothiazide at home.On comfort care   Elevated liver enzymes/hyperbilirubinemia: Suspected to be from hypoperfusion.    Diabetes type 2: Currently sugars are stable.  Continue sliding scale and monitor  Anxiety: On as needed Xanax  History of prior CVA  Morbid obesity: BMI 39  Goals of care: Has miserable condition with distended abdomen, no bowel movement, and NG tube.  Persistent abdominal pain.  Case discussed with oncology, consulted palliative care for goals of care.  Poor prognosis.  Dr. Chryl Heck planning discussed with the sister.  Palliative care met with family and initiated for comfort care.  Plan for transfer to residential hospice    Nutrition Problem: Inadequate oral  intake Etiology: inability to  eat    DVT prophylaxis:     Code Status: DNR  Family Communication: Called and discussed with sister on phone on 2/23.  Discussed with daughter at bedside on 2/26  Patient status:Inpatient  Patient is from :Home  Anticipated discharge [id number] hospice  Estimated DC date:1-2 days  Consultants: Surgery ,oncology,GI and IR   Procedures: Paracentesis, omental biopsy  Antimicrobials:  Anti-infectives (From admission, onward)    Start     Dose/Rate Route Frequency Ordered Stop   09/02/22 1800  vancomycin (VANCOREADY) IVPB 1250 mg/250 mL  Status:  Discontinued        1,250 mg 166.7 mL/hr over 90 Minutes Intravenous Daily-1800 09/01/22 1932 09/02/22 0925   09/01/22 2200  vancomycin (VANCOCIN) IVPB 1000 mg/200 mL premix        1,000 mg 200 mL/hr over 60 Minutes Intravenous  Once 09/01/22 1932 09/01/22 2228   09/01/22 1800  ceFEPIme (MAXIPIME) 2 g in sodium chloride 0.9 % 100 mL IVPB  Status:  Discontinued        2 g 200 mL/hr over 30 Minutes Intravenous Every 8 hours 09/01/22 1739 09/03/22 1354   09/01/22 1800  vancomycin (VANCOCIN) IVPB 1000 mg/200 mL premix        1,000 mg 200 mL/hr over 60 Minutes Intravenous  Once 09/01/22 1747 09/01/22 1939   08/20/22 2000  piperacillin-tazobactam (ZOSYN) IVPB 3.375 g        3.375 g 12.5 mL/hr over 240 Minutes Intravenous Every 8 hours 08/20/22 1325 08/25/22 1918   08/20/22 1130  piperacillin-tazobactam (ZOSYN) IVPB 3.375 g        3.375 g 100 mL/hr over 30 Minutes Intravenous  Once 08/20/22 1126 08/20/22 1223       Subjective: Patient seen and examined at bedside today.  On full comfort care, she was on deep sleep.  Does not look in distress.  Appears comfortable.  Daughter at bedside.  Objective: Vitals:   09/03/22 0755 09/03/22 1408 09/04/22 0551 09/04/22 1214  BP: 136/82 (!) 116/99 139/74 (!) 156/92  Pulse: (!) 110 (!) 110 (!) 114 (!) 115  Resp: '20 18 20 20  '$ Temp: 97.8 F (36.6 C) 98.9 F  (37.2 C) (!) 97.4 F (36.3 C) 98.5 F (36.9 C)  TempSrc: Oral Oral  Oral  SpO2: 98% 97% (!) 88% 95%  Weight: 103.9 kg     Height:        Intake/Output Summary (Last 24 hours) at 09/04/2022 1330 Last data filed at 09/04/2022 1100 Gross per 24 hour  Intake 998.81 ml  Output 1576 ml  Net -577.19 ml   Filed Weights   09/01/22 0500 09/02/22 0500 09/03/22 0755  Weight: 105.1 kg 102.1 kg 103.9 kg    Examination:  General exam: sleeping HEENT: NG tube Respiratory system: Diminished sounds bilaterally Cardiovascular system: Sinus tachycardia Gastrointestinal system: Abdomen is distended Central nervous system: Not Alert or oriented Extremities: No edema, no clubbing ,no cyanosis Skin: No rashes, no ulcers,no icterus     Data Reviewed: I have personally reviewed following labs and imaging studies  CBC: Recent Labs  Lab 08/30/22 1051 08/31/22 0448 09/02/22 0125 09/03/22 0306  WBC 12.5* 12.1* 18.3* 15.9*  HGB 10.4* 10.5* 10.5* 10.8*  HCT 33.8* 34.3* 34.7* 35.4*  MCV 101.2* 102.7* 103.3* 102.9*  PLT 274 210 231 [id number]   Basic Metabolic Panel: Recent Labs  Lab 08/30/22 0141 08/31/22 0448 09/01/22 0608 09/02/22 0125 09/03/22 0306  NA 141 146* 147* 145 140  K 3.7 3.8 4.2 4.0 4.2  CL 108 106 108 107 108  CO2 '28 31 28 30 26  '$ GLUCOSE 140* 139* 133* 148* 158*  BUN '19 17 11 '$ 24* 31*  CREATININE 0.82 0.95 0.85 0.97 0.94  CALCIUM 8.3* 8.9 9.0 8.3* 8.0*  MG 2.2 2.2 1.8 2.3 2.3  PHOS 2.6 2.8 2.3* 3.0 3.3     Recent Results (from the past 240 hour(s))  MRSA Next Gen by PCR, Nasal     Status: None   Collection Time: 09/01/22  3:46 PM   Specimen: Nasal Mucosa; Nasal Swab  Result Value Ref Range Status   MRSA by PCR Next Gen NOT DETECTED NOT DETECTED Final    Comment: (NOTE) The GeneXpert MRSA Assay (FDA approved for NASAL specimens only), is one component of a comprehensive MRSA colonization surveillance program. It is not intended to diagnose MRSA infection nor to  guide or monitor treatment for MRSA infections. Test performance is not FDA approved in patients less than 75 years old. Performed at [facility], [place] [address], [place], [place] [address]   Body fluid culture w Gram Stain     Status: None (Preliminary result)   Collection Time: 09/02/22 11:06 AM   Specimen: PATH Cytology Pleural fluid  Result Value Ref Range Status   Specimen Description   Final    PLEURAL LEFT SIDE Performed at [facility] [address], [place], [place] [address]    Special Requests   Final    NONE Performed at [facility], [place] [address] [place], [place] [address]    Gram Stain   Final    WBC PRESENT, PREDOMINANTLY MONONUCLEAR NO ORGANISMS SEEN    Culture   Final    NO GROWTH 2 DAYS Performed at [facility] [address] [place], [place] [address]    Report Status PENDING  Incomplete     Radiology Studies: No results found.  Scheduled Meds:  Benzocaine   Mouth/Throat Once   Chlorhexidine Gluconate Cloth  6 each Topical Daily   dexamethasone (DECADRON) injection  4 mg Intravenous Q24H   diazepam  5 mg Intravenous Q8H    HYDROmorphone (DILAUDID) injection  0.5 mg Intravenous Q6H   lip balm   Topical BID   metoCLOPramide (REGLAN) injection  10 mg Intravenous Q6H   ondansetron (ZOFRAN) IV  4 mg Intravenous Q8H   simethicone  80 mg Oral QID   sodium chloride flush  10-40 mL Intracatheter Q12H   Continuous Infusions:  sodium chloride Stopped (08/25/22 0520)   sodium chloride Stopped (09/01/22 0905)   octreotide (SANDOSTATIN) 500 mcg in sodium chloride 0.9 % 250 mL (2 mcg/mL) infusion 25 mcg/hr (09/04/22 1131)     LOS: 15 days   Shelly Coss, MD [facility] P2/26/2024, 1:30 PM

## 2022-09-04 NOTE — Plan of Care (Signed)
  Problem: Coping: Goal: Ability to adjust to condition or change in health will improve Outcome: Progressing   Problem: Fluid Volume: Goal: Ability to maintain a balanced intake and output will improve Outcome: Progressing   

## 2022-09-04 NOTE — Progress Notes (Signed)
[facility]) Hospital Liaison Note  Received request from Sheran Spine, [facility] for family interest in [facility]. Spoke with patient's daugter, [name], to confirm interest and explain services. Eligibility confirmed per [facility] MD. Unfortunately [facility] is not able to offer a room today. Family and TOC aware hospital liaison will follow up tomorrow or sooner if room becomes available.   Please do not hesitate to call with any questions.    Thank you, Zigmund Gottron RN  [facility] Liaison [phone]

## 2022-09-04 NOTE — Care Management Important Message (Signed)
Important Message  Patient Details No IM given due to Hospice. Name: [name] MRN: [id number] Date of Birth: 09-25-1950   Medicare Important Message Given:  No     Kerin Salen 09/04/2022, 1:31 PM

## 2022-09-04 NOTE — Progress Notes (Signed)
Daily Progress Note   Patient Name: [name]       Date: 09/04/2022 DOB: 04/22/1951  Age: 72 y.o. MRN#: [id number] Attending Physician: Shelly Coss, MD Primary Care Physician: London Pepper, MD Admit Date: 08/20/2022  Reason for Consultation/Follow-up: Presented with severe abdominal pain, nausea, vomiting.  Has severe distended abdomen, no bowel movement.  On NG tube to suction, TPN.  Omental biopsy confirmed high-grade serous carcinoma.  Oncology recommended palliative care consult.   Patient Profile/HPI:  72 y.o. female  with past medical history of IBS, HTN, M2, CVA, cholelithiasis admitted on 08/20/2022 with abdominal pain, nausea, vomiting. 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 She has had ongoing illeus. NG tube in place to suction. Has required paracentesis for ascites and thoracentesis for pleural effusion. Per Oncology- any chemo would be palliative in nature- patient not in current shape to receive chemo.        Subjective: Chart reviewed including labs, progress notes, imaging from this and previous encounters.  Patient is awake and alert. Denies nausea or pain. Enjoying ginger ale. Having a lot of output in suction cannister. Daughter at bedside. They have no concerns or questions.  Spoke with sister [name] by phone. Briyana's mental state is in and out. She was concerned d/t being told by hospice liaison they could not take [facility] to [facility] with NG tube in.  I called and clarified with Nicholaus Corolla, RN from [place] confirmed it is ok for patient to go to [facility] with NG tube to suction. Called  [facility] and gave her that information. Unfortunately, no bed today.   Physical Exam Vitals and nursing note reviewed.  Skin:    Coloration: Skin is pale.  Neurological:     Mental Status: She is alert. She is disoriented.             Vital Signs: BP (!) 156/92 (BP Location: Right Arm)   Pulse (!) 115   Temp 98.5 F (36.9 C) (Oral)   Resp 20   Ht '5\' 5"'$  (1.651 m)   Wt 103.9 kg   SpO2 95%   BMI 38.12 kg/m  SpO2: SpO2: 95 % O2 Device: O2 Device: Nasal Cannula O2  Flow Rate: O2 Flow Rate (L/min): 2 L/min  Intake/output summary:  Intake/Output Summary (Last 24 hours) at 09/04/2022 1454 Last data filed at 09/04/2022 1100 Gross per 24 hour  Intake 998.81 ml  Output 1576 ml  Net -577.19 ml    LBM: Last BM Date : 09/03/22 Baseline Weight: Weight: 99.8 kg Most recent weight: Weight: 103.9 kg       Palliative Assessment/Data: PPS:  10%      Patient Active Problem List   Diagnosis Date Noted   Hypertension 08/20/2022   AKI (acute kidney injury) ([place]) 08/20/2022   Morbid obesity ([place]) 08/20/2022   Hyperglycemia 08/20/2022   Ascites 08/20/2022   Pneumobilia 08/20/2022   Diabetes mellitus type 2, noninsulin dependent ([place]) 08/20/2022   Generalized anxiety disorder 08/20/2022   History of completed stroke 2013 08/20/2022   History of seizures 2013 08/20/2022   Cholelithiasis 08/20/2022   History of panic attacks 08/20/2022   Nausea, vomiting, and diarrhea 08/20/2022   Irritable bowel syndrome (IBS) 08/20/2022   Slow rate of speech - history of CVA 2012 08/20/2022   Abdominal pain, diffuse 08/20/2022    Palliative Care Assessment & Plan    Assessment/Recommendations/Plan  DNR- full comfort measures only Continue current symptom management for acute abdomen/illeus likely 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:  Octreotide 63mg/hr IV until discharge Ondansetron '4mg'$  IV q8 hours Dexamethasone '4mg'$  IV now x 1 and daily Hydromorphone 0.'5mg'$  IV q6 hours and q2hr  prn Continue NG tube to low intermittent suction Allow for clear liquids and comfort feeds from floorstock D/C interventions not necessary for comfort including medications, labs, TPN Referral to hospice for evaluation for inpatient placement- no beds available today   Code Status: DNR  Prognosis:  < 2 weeks  Discharge Planning: Hospice facility pending evaluation and bed availability  Care plan was discussed with family and care team.   Thank you for allowing the Palliative Medicine Team to assist in the care of this patient.  Greater than 50%  of this time was spent counseling and coordinating care related to the above assessment and plan.  KMariana Kaufman AGNP-C Palliative Medicine   Please contact Palliative Medicine Team phone at [phone] questions and concerns.

## 2022-09-04 NOTE — Plan of Care (Signed)
  Problem: Coping: Goal: Ability to adjust to condition or change in health will improve Outcome: Progressing   

## 2022-09-05 DIAGNOSIS — K5989 Other specified functional intestinal disorders: Secondary | ICD-10-CM

## 2022-09-05 DIAGNOSIS — C786 Secondary malignant neoplasm of retroperitoneum and peritoneum: Secondary | ICD-10-CM | POA: Diagnosis not present

## 2022-09-05 DIAGNOSIS — R112 Nausea with vomiting, unspecified: Secondary | ICD-10-CM | POA: Diagnosis not present

## 2022-09-05 DIAGNOSIS — Z7189 Other specified counseling: Secondary | ICD-10-CM | POA: Diagnosis not present

## 2022-09-05 NOTE — Progress Notes (Signed)
Daily Progress Note   Patient Name: [name]       Date: 09/05/2022 DOB: [birth year]  Age: 72 y.o. MRN#: [id number] Attending Physician: Shelly Coss, MD Primary Care Physician: London Pepper, MD Admit Date: 08/20/2022  Reason for Consultation/Follow-up: Presented with severe abdominal pain, nausea, vomiting.  Has severe distended abdomen, no bowel movement.  On NG tube to suction, TPN.  Omental biopsy confirmed high-grade serous carcinoma.  Oncology recommended palliative care consult.   Patient Profile/HPI:  72 y.o. female  with past medical history of IBS, HTN, M2, CVA, cholelithiasis admitted on 08/20/2022 with abdominal pain, nausea, vomiting. 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 She has had ongoing illeus. NG tube in place to suction. Has required paracentesis for ascites and thoracentesis for pleural effusion. Per Oncology- any chemo would be palliative in nature- patient not in current shape to receive chemo.        Subjective: Chart reviewed including labs, progress notes, imaging from this and previous encounters.  Patient is awake but drifts to sleep easily. Continues to have increased ouput through NG tube, though much of this is po liquids that she is enjoying.   Rosalin asked about having NG tube out. We discussed high risk for recurrence of abdominal distention, discomfort, nausea and pain- however, could clamp for a few hours and see how she tolerates it, then pull if she tolerates clamping.   She possibly has a bed at [facility] today but this is not yet confirmed.   Physical Exam Vitals and  nursing note reviewed.  Abdominal:     General: There is distension.     Palpations: There is fluid wave.     Tenderness: There is no abdominal tenderness.  Skin:    Coloration: Skin is pale.  Neurological:     Mental Status: She is alert. She is disoriented.             Vital Signs: BP (!) 156/91 (BP Location: Right Arm)   Pulse (!) 119   Temp 99.1 F (37.3 C) (Oral)   Resp 20   Ht '5\' 5"'$  (1.651 m)   Wt 103.9 kg   SpO2 96%   BMI 38.12 kg/m  SpO2: SpO2: 96 % O2 Device: O2 Device: Nasal Cannula O2 Flow Rate: O2 Flow Rate (L/min): 2 L/min  Intake/output summary:  Intake/Output Summary (Last 24 hours) at 09/05/2022 1143 Last data filed at 09/05/2022 0930 Gross per 24 hour  Intake 1326.96 ml  Output 4300 ml  Net -2973.04 ml    LBM: Last BM Date : 09/03/22 Baseline Weight: Weight: 99.8 kg Most recent weight: Weight: 103.9 kg       Palliative Assessment/Data: PPS:  10%      Patient Active Problem List   Diagnosis Date Noted   Hypertension 08/20/2022   AKI (acute kidney injury) ([place]) 08/20/2022   Morbid obesity ([place]) 08/20/2022   Hyperglycemia 08/20/2022   Ascites 08/20/2022   Pneumobilia 08/20/2022   Diabetes mellitus type 2, noninsulin dependent ([place]) 08/20/2022   Generalized anxiety disorder 08/20/2022   History of completed stroke 2013 08/20/2022   History of seizures 2013 08/20/2022   Cholelithiasis 08/20/2022   History of panic attacks 08/20/2022   Nausea, vomiting, and diarrhea 08/20/2022   Irritable bowel syndrome (IBS) 08/20/2022   Slow rate of speech - history of CVA 2012 08/20/2022   Abdominal pain, diffuse 08/20/2022    Palliative Care Assessment & Plan    Assessment/Recommendations/Plan  DNR- full comfort measures only Continue current symptom management for acute abdomen/illeus likely 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:  Octreotide 58mg/hr IV until discharge Ondansetron '4mg'$  IV q8 hours Dexamethasone '4mg'$  IV now x 1 and  daily Hydromorphone 0.'5mg'$  IV q6 hours and q2hr prn Continue NG tube to low intermittent suction- [facility] and [facility] how [facility] tolerates then consider removing if she tolerates the clamping- if she has a bed I will defer clamping the NG tube [facility] to avoid unnecessarily symptom burden prior to transfer Allow for clear liquids and comfort feeds from floorstock D/C interventions not necessary for comfort including medications, labs, TPN [facility]  Code Status: DNR  Prognosis:  < 2 weeks  Discharge Planning: Hospice facility pending evaluation and bed availability  Care plan was discussed with family and care team.   Thank you for allowing the Palliative Medicine Team to assist in the care of this patient.  Greater than 50%  of this time was spent counseling and coordinating care related to the above assessment and plan.  KMariana Kaufman AGNP-C Palliative Medicine   Please contact Palliative Medicine Team phone at [phone] questions and concerns.

## 2022-09-05 NOTE — Progress Notes (Signed)
Patient seen and examined at bedside today.  She is on full comfort care.  Daughter at the bedside.  Plan is to discharge to residential hospice as soon as bed is available.  No change in the medical management.  Discharge orders and summary already placed

## 2022-09-05 NOTE — TOC Transition Note (Signed)
Transition of Care [facility]) - CM/SW Discharge Note   Patient Details  Name: [name] MRN: [id number] Date of Birth: 10-08-1950  [facility]) CM/SW Contact:  Illene Regulus, LCSW Phone Number: 09/05/2022, 3:33 PM   Clinical Narrative:     Pt is to discharge to [place] was called for transport. TOC sign off.         Patient Goals and CMS Choice      Discharge Placement                         Discharge Plan and Services Additional resources added to the After Visit Summary for                                       Social Determinants of Health (SDOH) Interventions SDOH Screenings   Food Insecurity: No Food Insecurity (08/21/2022)  Housing: Low Risk  (08/21/2022)  Transportation Needs: No Transportation Needs (08/21/2022)  Utilities: Not At Risk (08/21/2022)  Tobacco Use: Medium Risk (09/01/2022)     Readmission Risk Interventions     No data to display

## 2022-09-05 NOTE — Progress Notes (Signed)
[facility]) Hospital Liaison Note  Bed has become available at [facility]. Spoke with patient's sister, [name], who has accepted the bed for today.   Hospital staff aware.  RN, please call report to [phone] prior to patient leaving the unit.  Please send signed and completed DNR with patient at discharge.   Thank you, Zigmund Gottron RN  [facility] Liaison [phone]

## 2022-09-06 LAB — BODY FLUID CULTURE W GRAM STAIN: Culture: NO GROWTH

## 2022-09-08 LAB — CYTOLOGY - NON PAP

## 2022-10-09 DEATH — deceased
